# Patient Record
Sex: Male | Born: 1970 | Race: Black or African American | Hispanic: No | Marital: Married | Smoking: Never smoker
Health system: Southern US, Community
[De-identification: ages and names within clinical notes are randomized; demographics above are authoritative.]

## PROBLEM LIST (undated history)

## (undated) DIAGNOSIS — I10 Essential (primary) hypertension: Secondary | ICD-10-CM

## (undated) DIAGNOSIS — J449 Chronic obstructive pulmonary disease, unspecified: Secondary | ICD-10-CM

## (undated) DIAGNOSIS — I739 Peripheral vascular disease, unspecified: Secondary | ICD-10-CM

## (undated) DIAGNOSIS — E782 Mixed hyperlipidemia: Secondary | ICD-10-CM

## (undated) DIAGNOSIS — Z9114 Patient's other noncompliance with medication regimen: Secondary | ICD-10-CM

## (undated) DIAGNOSIS — I119 Hypertensive heart disease without heart failure: Secondary | ICD-10-CM

## (undated) DIAGNOSIS — I509 Heart failure, unspecified: Secondary | ICD-10-CM

## (undated) DIAGNOSIS — I2510 Atherosclerotic heart disease of native coronary artery without angina pectoris: Secondary | ICD-10-CM

## (undated) DIAGNOSIS — Z91148 Patient's other noncompliance with medication regimen for other reason: Secondary | ICD-10-CM

## (undated) DIAGNOSIS — Z8673 Personal history of transient ischemic attack (TIA), and cerebral infarction without residual deficits: Secondary | ICD-10-CM

## (undated) DIAGNOSIS — Z992 Dependence on renal dialysis: Secondary | ICD-10-CM

## (undated) DIAGNOSIS — N186 End stage renal disease: Secondary | ICD-10-CM

## (undated) HISTORY — DX: Patient's other noncompliance with medication regimen for other reason: Z91.148

## (undated) HISTORY — PX: CARDIAC CATHETERIZATION: SHX172

## (undated) HISTORY — DX: Peripheral vascular disease, unspecified: I73.9

## (undated) HISTORY — DX: Essential (primary) hypertension: I10

## (undated) HISTORY — DX: Dependence on renal dialysis: N18.6

## (undated) HISTORY — DX: Chronic obstructive pulmonary disease, unspecified: J44.9

## (undated) HISTORY — DX: Personal history of transient ischemic attack (TIA), and cerebral infarction without residual deficits: Z86.73

## (undated) HISTORY — DX: Heart failure, unspecified: I50.9

## (undated) HISTORY — DX: Mixed hyperlipidemia: E78.2

## (undated) HISTORY — DX: Patient's other noncompliance with medication regimen: Z91.14

## (undated) HISTORY — DX: Dependence on renal dialysis: Z99.2

## (undated) HISTORY — DX: Hypertensive heart disease without heart failure: I11.9

## (undated) HISTORY — DX: Atherosclerotic heart disease of native coronary artery without angina pectoris: I25.10

## (undated) HISTORY — PX: COLONOSCOPY: SHX174

---

## 2001-07-30 ENCOUNTER — Emergency Department (HOSPITAL_COMMUNITY): Admission: EM | Admit: 2001-07-30 | Discharge: 2001-07-30 | Payer: Self-pay | Admitting: Emergency Medicine

## 2003-02-02 ENCOUNTER — Emergency Department (HOSPITAL_COMMUNITY): Admission: EM | Admit: 2003-02-02 | Discharge: 2003-02-02 | Payer: Self-pay | Admitting: Emergency Medicine

## 2005-03-22 ENCOUNTER — Emergency Department (HOSPITAL_COMMUNITY): Admission: EM | Admit: 2005-03-22 | Discharge: 2005-03-22 | Payer: Self-pay | Admitting: Emergency Medicine

## 2005-04-19 ENCOUNTER — Emergency Department (HOSPITAL_COMMUNITY): Admission: EM | Admit: 2005-04-19 | Discharge: 2005-04-19 | Payer: Self-pay | Admitting: Emergency Medicine

## 2005-06-10 ENCOUNTER — Emergency Department (HOSPITAL_COMMUNITY): Admission: EM | Admit: 2005-06-10 | Discharge: 2005-06-10 | Payer: Self-pay | Admitting: Emergency Medicine

## 2005-10-01 ENCOUNTER — Emergency Department (HOSPITAL_COMMUNITY): Admission: EM | Admit: 2005-10-01 | Discharge: 2005-10-01 | Payer: Self-pay | Admitting: Emergency Medicine

## 2007-05-20 ENCOUNTER — Encounter: Payer: Self-pay | Admitting: Emergency Medicine

## 2007-05-20 ENCOUNTER — Ambulatory Visit: Payer: Self-pay | Admitting: *Deleted

## 2007-05-20 ENCOUNTER — Inpatient Hospital Stay (HOSPITAL_COMMUNITY): Admission: AD | Admit: 2007-05-20 | Discharge: 2007-05-24 | Payer: Self-pay | Admitting: Cardiovascular Disease

## 2007-05-23 ENCOUNTER — Encounter: Payer: Self-pay | Admitting: Cardiovascular Disease

## 2007-06-09 ENCOUNTER — Ambulatory Visit: Payer: Self-pay | Admitting: Cardiovascular Disease

## 2008-06-11 ENCOUNTER — Emergency Department (HOSPITAL_COMMUNITY): Admission: EM | Admit: 2008-06-11 | Discharge: 2008-06-11 | Payer: Self-pay | Admitting: Emergency Medicine

## 2008-06-23 ENCOUNTER — Emergency Department (HOSPITAL_COMMUNITY): Admission: EM | Admit: 2008-06-23 | Discharge: 2008-06-23 | Payer: Self-pay | Admitting: Emergency Medicine

## 2009-03-24 ENCOUNTER — Emergency Department (HOSPITAL_COMMUNITY): Admission: EM | Admit: 2009-03-24 | Discharge: 2009-03-24 | Payer: Self-pay | Admitting: Emergency Medicine

## 2010-01-24 ENCOUNTER — Inpatient Hospital Stay (HOSPITAL_COMMUNITY)
Admission: EM | Admit: 2010-01-24 | Discharge: 2010-01-29 | Disposition: A | Discharge status: Other Acute Inpatient Hospital | Payer: Self-pay | Source: Home / Self Care | Attending: Family Medicine | Admitting: Family Medicine

## 2010-01-25 ENCOUNTER — Encounter: Payer: Self-pay | Admitting: Cardiovascular Disease

## 2010-01-25 ENCOUNTER — Inpatient Hospital Stay (HOSPITAL_COMMUNITY)
Admission: EM | Admit: 2010-01-25 | Discharge: 2010-01-29 | Payer: Self-pay | Source: Home / Self Care | Attending: Internal Medicine | Admitting: Internal Medicine

## 2010-01-26 ENCOUNTER — Encounter (INDEPENDENT_AMBULATORY_CARE_PROVIDER_SITE_OTHER): Payer: Self-pay | Admitting: Cardiology

## 2010-01-26 DIAGNOSIS — Z8673 Personal history of transient ischemic attack (TIA), and cerebral infarction without residual deficits: Secondary | ICD-10-CM

## 2010-01-26 HISTORY — DX: Personal history of transient ischemic attack (TIA), and cerebral infarction without residual deficits: Z86.73

## 2010-01-29 LAB — CBC
HCT: 41.3 % (ref 39.0–52.0)
Hemoglobin: 13.4 g/dL (ref 13.0–17.0)
MCH: 28.1 pg (ref 26.0–34.0)
MCHC: 32.4 g/dL (ref 30.0–36.0)
MCV: 86.6 fL (ref 78.0–100.0)
Platelets: 394 10*3/uL (ref 150–400)
RBC: 4.77 MIL/uL (ref 4.22–5.81)
RDW: 13.4 % (ref 11.5–15.5)
WBC: 9.9 10*3/uL (ref 4.0–10.5)

## 2010-01-29 LAB — RENAL FUNCTION PANEL
Albumin: 3.3 g/dL — ABNORMAL LOW (ref 3.5–5.2)
BUN: 33 mg/dL — ABNORMAL HIGH (ref 6–23)
CO2: 24 mEq/L (ref 19–32)
Calcium: 9.9 mg/dL (ref 8.4–10.5)
Chloride: 105 mEq/L (ref 96–112)
Creatinine, Ser: 3.96 mg/dL — ABNORMAL HIGH (ref 0.4–1.5)
GFR calc Af Amer: 20 mL/min — ABNORMAL LOW (ref >60)
GFR calc non Af Amer: 17 mL/min — ABNORMAL LOW (ref >60)
Glucose, Bld: 102 mg/dL — ABNORMAL HIGH (ref 70–99)
Phosphorus: 4 mg/dL (ref 2.3–4.6)
Potassium: 4.2 mEq/L (ref 3.5–5.1)
Sodium: 140 mEq/L (ref 135–145)

## 2010-01-29 LAB — HEPARIN LEVEL (UNFRACTIONATED): Heparin Unfractionated: 0.74 IU/mL — ABNORMAL HIGH (ref 0.30–0.70)

## 2010-02-27 NOTE — Consult Note (Signed)
NAMEAkash, Winski Alexander                  ACCOUNT NO.:  [id number]  MEDICAL RECORD NO.:  [id number]          PATIENT TYPE:  INP  LOCATION:  3736                         FACILITY:  [facility]  PHYSICIAN:  Tanaka Gillen C. Eden Emms, MD, FACCDATE OF BIRTH:  March 02, 1970  DATE OF CONSULTATION:  01/25/2010 DATE OF DISCHARGE:                                CONSULTATION   PRIMARY CARDIOLOGIST:  Noralyn Pick. Eden Emms, MD, [facility], in [place] office.  PRIMARY CARE PROVIDER:  Provided at [facility].  PATIENT PROFILE:  This is a 40 year old African American male with prior history of hypertension and hypertensive urgency as well as chronic kidney disease who presents with acute hypertensive urgency and noted to have elevated cardiac markers.  PROBLEM LIST: 1. Hypertensive urgency.     a.     On May 23, 2007, 2-D echo, EF 60%, no regional wall motion      abnormalities.     b.     In April 2009, stress Myoview showing an EF of 37% with no      evidence of ischemia. 2. Acute on chronic stage IV kidney disease. 3. Hyperlipidemia. 4. Medication nonadherence.  ALLERGIES:  No known drug allergies.  HISTORY OF PRESENT ILLNESS:  This is a 40 year old male with history of hypertension that has been untreated for at least 4-6 months as the patient says he has not had money to buy his medicines.  He was previously admitted in April 2009 with positive troponin in the setting of hypertensive urgency.  During that admission, he had a normal echocardiogram and Myoview.  Over the last 2 weeks, the patient has had intermittent headaches as well as right arm pain that is worse with biceps flexion and rotation of his forearm.  Arm symptoms can last a day at a time.  He had worsening headache yesterday prompting him present to the [facility] where he was found to be markedly hypertensive with pressures of 200s/130s.  He was also found to have an elevated creatinine of 4.45 with CK-MB of 10.7 and troponin I of  0.27.  he was treated with IV and oral labetalol as well as amlodipine and transferred to [facility] for further evaluation.  Currently, the patient has no complaints.  His pressures are in the 150s/90s.  CURRENT MEDICATIONS: 1. Norvasc 10 mg daily. 2. Enoxaparin 40 mg q.p.m. 3. Labetalol 200 mg q.6 h. 4. K-Dur p.r.n.  FAMILY HISTORY:  Mother is alive at age 58 with hypertension and borderline diabetes mellitus.  Father died at 46 with history diabetes and lung cancer.  He has 3 sisters and 1 brother, all are alive and well.  SOCIAL HISTORY:  The patient lives in [place] with his wife.  He details cars for living.  He has a 50 year old stepson at home.  He denies tobacco, alcohol, or drug use.  He does not routinely exercise.  REVIEW OF SYSTEMS:  Positive for headaches and right arm pain as outlined in the HPI.  Otherwise, all systems are reviewed and are negative.  He is a full code.  PHYSICAL EXAMINATION:  VITAL SIGNS:  The patient  is afebrile, heart rate 77, respirations 16, blood pressure 156/96, and pulse ox 100% on 2liters. GENERAL:  A pleasant African American male, in no acute distress, awake, alert, and oriented x3.  He has a normal affect. HEENT:  Normal. NEURO:  Grossly intact and nonfocal. SKIN:  Warm and dry without lesions or masses. NECK:  Supple without bruits or JVD. LUNGS:  Respirations are regular and unlabored.  Clear to auscultation. CARDIAC:  Regular, S1 and S2, with an S4.  No murmurs. ABDOMEN:  Round, soft, nontender, and nondistended.  Bowel sounds present x4. EXTREMITIES:  Warm, dry, and pink.  No clubbing, cyanosis, or edema. Dorsalis pedis and posterior tibial pulses are 2+ and equal bilaterally.  Chest x-ray shows no acute findings, with possible cardiomegaly.  EKG shows sinus tachycardia, rate of 109, normal axis, LVH with inferolateral T-wave inversion, unchanged from prior ECG in 2009. Hemoglobin 12.7, hematocrit 35.5, WBC 10.1, and platelets  270.  Sodium 134, potassium 3.1, chloride 99, CO2 of 27, BUN 45, creatinine 3.99, and glucose 122.  CK 362, MB 8.4, and troponin-I 0.17.  Urine drug screen negative.  MRSA screen negative.  INR 1.09.  ASSESSMENT/PLAN: 1. Hypertensive urgency:  The patient presents with marked     hypertensive urgency in the setting of medication nonadherence over     the past 6+ months.  Pressure is currently stabilized in the 150/90     range on oral labetalol and amlodipine.  I agree with current     therapy.  We will repeat a 2-D echocardiogram given significant LVH     on EKG and evidence of end-organ damage.  I had a long discussion     with the patient and family discussing low-sodium diet and     importance of medication adherence.  The patient admits to eating     fast food in pretty much every meal.  He will need significant     education going forward. 2. Elevated cardiac markers:  The patient has elevated CK-MB and     troponin with the relative index of only 2.0.  Of note, he had a     troponin rise to 1.09 in April 2009 and at that time had normal EF     and a Myoview.  As it has been 2 years since his ischemic     evaluation, we will plan a Lexiscan Myoview once pressure is better     controlled to rule out ischemia.  We will avoid cath/contrast in     the setting of renal failure. 3. Acute on chronic stage IV kidney disease:  The patient's creatinine     is elevated this admission in the setting of     above.  Creatinine of May 2010 was 2.19.  Renal consult is pending. 4. History of hyperlipidemia:  Check lipids. 5. Noncompliant:  See above.     Nicolasa Ducking, ANP   ______________________________ Noralyn Pick. Eden Emms, MD, [facility]    CB/MEDQ  D:  01/25/2010  T:  01/26/2010  Job:  [id number]  Electronically Signed by Nicolasa Ducking ANP on 02/24/2010 12:22:13 PM Electronically Signed by Charlton Haws MD [facility] on 02/27/2010 10:36:59 PM

## 2010-03-13 NOTE — Letter (Signed)
Summary: [facility] Discharge Summary  [facility] Discharge Summary   Imported By: Earl Many 02/27/2010 18:36:47  _____________________________________________________________________  External Attachment:    Type:   Image     Comment:   External Document

## 2010-04-07 LAB — LIPID PANEL
Cholesterol: 171 mg/dL (ref 0–200)
HDL: 33 mg/dL — ABNORMAL LOW (ref >39)
HDL: 33 mg/dL — ABNORMAL LOW (ref >39)
LDL Cholesterol: 114 mg/dL — ABNORMAL HIGH (ref 0–99)
Total CHOL/HDL Ratio: 5.2 RATIO
Total CHOL/HDL Ratio: 5.2 RATIO
Triglycerides: 120 mg/dL (ref <150)

## 2010-04-07 LAB — HEMOGLOBIN A1C: Mean Plasma Glucose: 126 mg/dL — ABNORMAL HIGH (ref <117)

## 2010-04-07 LAB — RAPID URINE DRUG SCREEN, HOSP PERFORMED
Amphetamines: NOT DETECTED
Barbiturates: NOT DETECTED
Benzodiazepines: NOT DETECTED
Cocaine: NOT DETECTED
Opiates: NOT DETECTED
Tetrahydrocannabinol: NOT DETECTED

## 2010-04-07 LAB — BASIC METABOLIC PANEL
BUN: 45 mg/dL — ABNORMAL HIGH (ref 6–23)
BUN: 47 mg/dL — ABNORMAL HIGH (ref 6–23)
CO2: 26 mEq/L (ref 19–32)
CO2: 29 mEq/L (ref 19–32)
Calcium: 8.3 mg/dL — ABNORMAL LOW (ref 8.4–10.5)
Calcium: 8.6 mg/dL (ref 8.4–10.5)
Chloride: 103 mEq/L (ref 96–112)
Chloride: 98 mEq/L (ref 96–112)
Creatinine, Ser: 3.99 mg/dL — ABNORMAL HIGH (ref 0.4–1.5)
Creatinine, Ser: 4.45 mg/dL — ABNORMAL HIGH (ref 0.4–1.5)
GFR calc Af Amer: 18 mL/min — ABNORMAL LOW (ref >60)
GFR calc non Af Amer: 15 mL/min — ABNORMAL LOW (ref >60)
GFR calc non Af Amer: 16 mL/min — ABNORMAL LOW (ref >60)
GFR calc non Af Amer: 17 mL/min — ABNORMAL LOW (ref >60)
Glucose, Bld: 103 mg/dL — ABNORMAL HIGH (ref 70–99)
Glucose, Bld: 106 mg/dL — ABNORMAL HIGH (ref 70–99)
Glucose, Bld: 122 mg/dL — ABNORMAL HIGH (ref 70–99)
Potassium: 3.3 mEq/L — ABNORMAL LOW (ref 3.5–5.1)
Potassium: 3.4 mEq/L — ABNORMAL LOW (ref 3.5–5.1)
Sodium: 137 mEq/L (ref 135–145)
Sodium: 138 mEq/L (ref 135–145)

## 2010-04-07 LAB — IRON AND TIBC
Iron: 81 ug/dL (ref 42–135)
UIBC: 123 ug/dL

## 2010-04-07 LAB — DIFFERENTIAL
Basophils Absolute: 0 K/uL (ref 0.0–0.1)
Basophils Relative: 0 % (ref 0–1)
Eosinophils Absolute: 0.3 10*3/uL (ref 0.0–0.7)
Eosinophils Relative: 3 % (ref 0–5)
Lymphocytes Relative: 20 % (ref 12–46)
Lymphs Abs: 2.1 10*3/uL (ref 0.7–4.0)
Monocytes Absolute: 0.9 K/uL (ref 0.1–1.0)
Monocytes Relative: 9 % (ref 3–12)
Neutro Abs: 6.9 K/uL (ref 1.7–7.7)
Neutrophils Relative %: 68 % (ref 43–77)

## 2010-04-07 LAB — CBC
HCT: 33 % — ABNORMAL LOW (ref 39.0–52.0)
HCT: 35.5 % — ABNORMAL LOW (ref 39.0–52.0)
HCT: 36.7 % — ABNORMAL LOW (ref 39.0–52.0)
Hemoglobin: 10.7 g/dL — ABNORMAL LOW (ref 13.0–17.0)
Hemoglobin: 11.1 g/dL — ABNORMAL LOW (ref 13.0–17.0)
Hemoglobin: 11.7 g/dL — ABNORMAL LOW (ref 13.0–17.0)
Hemoglobin: 12 g/dL — ABNORMAL LOW (ref 13.0–17.0)
MCH: 27.5 pg (ref 26.0–34.0)
MCH: 27.8 pg (ref 26.0–34.0)
MCHC: 32.4 g/dL (ref 30.0–36.0)
MCHC: 33 g/dL (ref 30.0–36.0)
MCV: 83.5 fL (ref 78.0–100.0)
MCV: 85.7 fL (ref 78.0–100.0)
MCV: 86.4 fL (ref 78.0–100.0)
MCV: 86.5 fL (ref 78.0–100.0)
Platelets: 267 10*3/uL (ref 150–400)
Platelets: 270 10*3/uL (ref 150–400)
RBC: 4.07 MIL/uL — ABNORMAL LOW (ref 4.22–5.81)
RBC: 4.25 MIL/uL (ref 4.22–5.81)
RBC: 4.25 MIL/uL (ref 4.22–5.81)
RDW: 12.7 % (ref 11.5–15.5)
RDW: 13.3 % (ref 11.5–15.5)
WBC: 10.1 K/uL (ref 4.0–10.5)
WBC: 10.4 10*3/uL (ref 4.0–10.5)
WBC: 6 10*3/uL (ref 4.0–10.5)

## 2010-04-07 LAB — URINALYSIS, ROUTINE W REFLEX MICROSCOPIC
Bilirubin Urine: NEGATIVE
Glucose, UA: NEGATIVE mg/dL
Ketones, ur: NEGATIVE mg/dL
Leukocytes, UA: NEGATIVE
Nitrite: NEGATIVE
Protein, ur: 100 mg/dL — AB
Specific Gravity, Urine: 1.02 (ref 1.005–1.030)
Urobilinogen, UA: 0.2 mg/dL (ref 0.0–1.0)
pH: 6.5 (ref 5.0–8.0)

## 2010-04-07 LAB — POCT CARDIAC MARKERS
CKMB, poc: 10.6 ng/mL (ref 1.0–8.0)
Myoglobin, poc: >500 ng/mL (ref 12–200)
Troponin i, poc: <0.05 ng/mL (ref 0.00–0.09)

## 2010-04-07 LAB — COMPREHENSIVE METABOLIC PANEL
AST: 17 U/L (ref 0–37)
Albumin: 2.7 g/dL — ABNORMAL LOW (ref 3.5–5.2)
Alkaline Phosphatase: 70 U/L (ref 39–117)
BUN: 40 mg/dL — ABNORMAL HIGH (ref 6–23)
Chloride: 107 mEq/L (ref 96–112)
Potassium: 3.4 mEq/L — ABNORMAL LOW (ref 3.5–5.1)
Total Bilirubin: 0.3 mg/dL (ref 0.3–1.2)

## 2010-04-07 LAB — PROTIME-INR
INR: 1.04 (ref 0.00–1.49)
INR: 1.09 (ref 0.00–1.49)
Prothrombin Time: 13.8 seconds (ref 11.6–15.2)
Prothrombin Time: 14.3 seconds (ref 11.6–15.2)

## 2010-04-07 LAB — RENAL FUNCTION PANEL
Albumin: 2.4 g/dL — ABNORMAL LOW (ref 3.5–5.2)
BUN: 38 mg/dL — ABNORMAL HIGH (ref 6–23)
CO2: 24 mEq/L (ref 19–32)
CO2: 26 mEq/L (ref 19–32)
Calcium: 9.1 mg/dL (ref 8.4–10.5)
Chloride: 106 mEq/L (ref 96–112)
Creatinine, Ser: 4.16 mg/dL — ABNORMAL HIGH (ref 0.4–1.5)
GFR calc Af Amer: 21 mL/min — ABNORMAL LOW (ref >60)
GFR calc non Af Amer: 17 mL/min — ABNORMAL LOW (ref >60)
Glucose, Bld: 101 mg/dL — ABNORMAL HIGH (ref 70–99)
Potassium: 3.8 mEq/L (ref 3.5–5.1)
Potassium: 4.1 mEq/L (ref 3.5–5.1)
Sodium: 138 mEq/L (ref 135–145)

## 2010-04-07 LAB — METHYLMALONIC ACID, SERUM: Methylmalonic Acid, Quantitative: 247 nmol/L (ref 87–318)

## 2010-04-07 LAB — MRSA PCR SCREENING: MRSA by PCR: NEGATIVE

## 2010-04-07 LAB — CARDIAC PANEL(CRET KIN+CKTOT+MB+TROPI)
Total CK: 154 U/L (ref 7–232)
Troponin I: 0.17 ng/mL — ABNORMAL HIGH (ref 0.00–0.06)

## 2010-04-07 LAB — URINE MICROSCOPIC-ADD ON

## 2010-04-07 LAB — CK TOTAL AND CKMB (NOT AT ARMC)
CK, MB: 10.7 ng/mL (ref 0.3–4.0)
Relative Index: 2 (ref 0.0–2.5)
Total CK: 534 U/L — ABNORMAL HIGH (ref 7–232)

## 2010-04-07 LAB — HEPARIN LEVEL (UNFRACTIONATED)
Heparin Unfractionated: 0.1 IU/mL — ABNORMAL LOW (ref 0.30–0.70)
Heparin Unfractionated: 0.16 IU/mL — ABNORMAL LOW (ref 0.30–0.70)
Heparin Unfractionated: 0.51 IU/mL (ref 0.30–0.70)

## 2010-04-07 LAB — TROPONIN I: Troponin I: 0.27 ng/mL — ABNORMAL HIGH (ref 0.00–0.06)

## 2010-04-07 LAB — APTT: aPTT: 29 s (ref 24–37)

## 2010-05-06 LAB — POCT CARDIAC MARKERS: Myoglobin, poc: 137 ng/mL (ref 12–200)

## 2010-05-06 LAB — BASIC METABOLIC PANEL
BUN: 21 mg/dL (ref 6–23)
Creatinine, Ser: 2.19 mg/dL — ABNORMAL HIGH (ref 0.4–1.5)
GFR calc Af Amer: 41 mL/min — ABNORMAL LOW (ref >60)
GFR calc non Af Amer: 34 mL/min — ABNORMAL LOW (ref >60)
Potassium: 3.4 mEq/L — ABNORMAL LOW (ref 3.5–5.1)

## 2010-06-10 NOTE — Discharge Summary (Signed)
NAMEALCARIO, Mario Alexander                  ACCOUNT NO.:  [id number]   MEDICAL RECORD NO.:  [id number]          PATIENT TYPE:  INP   LOCATION:  4711                         FACILITY:  [facility]   PHYSICIAN:  Theodore Demark, PA-C   DATE OF BIRTH:  02/13/1970   DATE OF ADMISSION:  05/20/2007  DATE OF DISCHARGE:  05/24/2007                               DISCHARGE SUMMARY   PROCEDURES:  1. Adenosine Myoview.  2. 2-D echocardiogram.   PRIMARY AND FINAL DISCHARGE DIAGNOSIS:  Hypertensive urgency.   SECONDARY DIAGNOSES:  1. Chronic kidney disease stage 3 with a BUN of 24, creatinine of      2.21, and GFR 41 this admission.  2. Preserved left ventricular function with severe left ventricular      hypertrophy by echocardiogram this admission.   TIME OF DISCHARGE:  39 minutes.   HOSPITAL COURSE:  Mario Alexander is a 40 year old male with no previous  history of coronary artery disease.  Hypertensive and hyperlipidemia  medications for several months.  He went to [facility]  with a chief complaint of a headache.  His blood pressure was  significantly elevated, and it was felt that he needed to be admitted  for this, so he was transferred to [facility] for further  evaluation and admission.   His initial blood pressure was 260/115.  He received multiple IV  medications and was started on p.o. medicines as well.   Initially, he had hyponatremia with a sodium of 133, but this resolved.  His BUN and creatinine were elevated at 27/2.55 with a GFR of 34.  He  was hydrated, and this improved slightly, and at discharge his BUN was  24 with a creatinine of 2.21 and a GFR of 41.  His cardiac enzymes  became elevated with a peak CK-MB of 498/9.4 and then a repeat CK-MB 48  hours later of 197/9.0.  His troponin went to 1.09.  A lipid profile was  performed, which showed a total cholesterol of 109, triglycerides 76,  HDL 39, and LDL 136.  He was started on Zocor 40.  TSH was on the low  end  of normal at 0.530.  The urine drug screen was positive only for  opiates.   Because of his abnormal kidney function, it was felt that he should be  risk stratified with a Myoview.  Initially, we attempted to do a  treadmill Myoview; however, after less than 6 minutes on the treadmill,  his blood pressure was 220/125.  Dr. Myrtis Ser felt that to continue would  not be safe and he was changed to an adenosine Myoview.  The adenosine  Myoview results were negative for pharmacologic stress-induced ischemia.  His EF was 37%, but the radiologist felt that this was underestimated.  An echocardiogram showed severe LVH with an EF of 55%.   By May 24, 2007, his blood pressure had significantly improved and was  146/102.  He had no chest pain and no shortness of breath.  Mario Alexander  was evaluated by Dr. Excell Seltzer and considered stable for discharge with  outpatient followup and refill.   DISCHARGE INSTRUCTIONS:  His activity level is to be increased  gradually.  He is to follow up with the [facility] as  scheduled.  He is to follow up with Dr. Eden Emms at the [facility]  on Jun 09, 2007 at 11:15.  He is encouraged to eat a low-sodium and  heart-healthy diet.   DISCHARGE MEDICATIONS:  1. Aspirin 81 mg daily.  2. Norvasc 5 mg a day.  3. Coreg 12.5 mg b.i.d.  4. Simvastatin 40 mg a day.  5. Clonidine 0.1 mg b.i.d.      Theodore Demark, PA-C     RB/MEDQ  D:  05/24/2007  T:  05/25/2007  Job:  [id number]   cc:   [facility]

## 2010-06-10 NOTE — Consult Note (Signed)
NAME:  Mario Alexander, Mario Alexander                  ACCOUNT NO.:  [id number]   MEDICAL RECORD NO.:  [id number]          PATIENT TYPE:  EMS   LOCATION:  ED                            FACILITY:  [facility]   PHYSICIAN:  Osvaldo Shipper, MD     DATE OF BIRTH:  1970-02-16   DATE OF CONSULTATION:  05/20/2007  DATE OF DISCHARGE:                                 CONSULTATION   He [facility] to the free clinic.  He used to see Dr. Renard Matter in the past, but  this does not do so any more.   ADMITTING DIAGNOSES:  1. Hypertensive emergency with acute renal failure and myocardial-      demand ischemia.  2. Headaches as a result of #1 as well.   CHIEF COMPLAINT:  Headache for one week.   HISTORY OF PRESENT ILLNESS:  The patient is a 40 year old, African-  American male who has a 10-year history of hypertension who is not very  compliant with medications, who tells me that he ran out of his blood  pressure medications about a week ago and then started getting headache.  He ignored these symptoms for the last one week and decided to come in  today because of severe pain.  The headache is particularly in the back  of his head.  He denies any chest pain or shortness of breath, any  seizure activity, focal deficits, fever, chills.  His pain is about 7/10  in intensity.  He has not checked his blood pressure in the last one  week.  When he presented to the ED, his blood pressure was more than  200/115.  The patient was given IV medications with labetalol, and he  was also given clonidine p.o., and he was given sublingual  nitroglycerin.  Blood pressure subsided transiently but then has again  started elevating.  While I was questioning the patient, he had about  three episodes of emesis with clear fluid.   MEDICATIONS AT HOME:  He is supposed to be on the following:  1. Diovan HCT 160/12.5 a day.  2. Lipitor unknown dose.  3. Norvasc unknown dose.   ALLERGIES:  No known drug allergies.   PAST MEDICAL HISTORY:  Hypertension  for 10 years.  It is unclear if he  has been worked up for secondary causes of hypertension or not.  The  patient is not very clear on this aspect.   Denies any surgeries in the past.   SOCIAL HISTORY:  Lives in [place] with his wife.  He does clerical  work.  Denies smoking, alcohol, or illicit drug use.   FAMILY HISTORY:  Noncontributory.   REVIEW OF SYSTEMS:  The patient is a somewhat lethargic and somnolent,  easily arousable though but review of system is unable to be done.   PHYSICAL EXAMINATION:  VITAL SIGNS:  Temperature was 97.9.  Blood  pressure medication when he came in was 260/115; last reading was  177/118.  His pulse when he came in was 101, and he is running about 60-  70.  Saturation 98% on room air.  GENERAL  EXAM:  Is well-developed, well-nourished individual, slightly  somnolent but easily arousable, in no distress.  HEENT:  There is no pallor, no icterus.  Oral mucous membranes moist.  Pupils are very small, probably because of the narcotic he has been  given.  NECK:  Soft and supple.  No thyromegaly appreciated.  LUNGS:  Clear to auscultation bilaterally.  No wheezing, rales, or  rhonchi.  CARDIOVASCULAR:  S1/S2 normal, regular.  No murmurs appreciated.  ABDOMEN:  Soft, nontender, nondistended.  Bowel sounds are present.  No  mass or organomegaly appreciated.  EXTREMITIES:  Show no edema.  Peripheral pulses are palpable.  NEUROLOGICAL:  The patient is somnolent, arousable, oriented x3.  No  focal neurological deficits are present.   LABORATORY DATA:  Sodium was 133, potassium 3.4, chloride is 96, bicarb  is 28, glucose 133, BUN is 27, creatinine is 2.55.  Calcium 9.4.  CK  total is 498, MB is 9.4, RI is 1.9, troponin 0.08.  No previous labs  available in our system here.  He had two EKGs done, one when he  presented - at that time he was found to be in sinus rhythm with a rate  of 98, normal axis, intervals appear to be the normal range, diffuse ST   depression with T-wave inversion was noted.  Evidence for left  ventricular hypertrophy is present; early repolarization changes are  present.  Repeat EKG shows similar changes except that the rate has  slowed to 67.  He had a CT head because of his headaches which was  negative for any acute intracranial process.  Mucous retention cyst was  noted in the left maxillary sinus.  He has not had a chest x-ray.   ASSESSMENT:  This is a 40 year old African-American male who presents  with headache and is found to have evidence for hypertensive emergency.  He has evidence for myocardial ischemia because of demand.  He has  evidence for possibly acute renal failure which is probably also the  result of the hypertension.  So, he has two-system involvement with  hypertensive emergency.  Because we do not have any cardiology coverage  starting now and through the weekend, I hesitate in admitting this  patient to our hospital, as we are not capable of taking care of any  cardiac complications if that were to happen.  So, I discussed the case  with Dr. Excell Seltzer with [facility] who has accepted the patient in  transfer.  The patient has been started on nitroglycerin drip.  His  nausea and vomiting are likely because of his high blood pressure and  some of the medications he has been given.  If this does not subside, he  will need further imaging studies.   We will also need to undergo a chest x-ray as this has not been done  yet.   Ultrasound of his kidneys will also be desirable to check the kidney  size and to see if there is any role to evaluate for renal artery  stenosis.  Other workup for secondary hypertension possibly can be done  as an outpatient, if that has not already been pursued.      Osvaldo Shipper, MD  Electronically Signed     GK/MEDQ  D:  05/20/2007  T:  05/20/2007  Job:  [id number]   cc:   Veverly Fells. Excell Seltzer, MD  [address]  [place], [place] [address]

## 2010-06-10 NOTE — Assessment & Plan Note (Signed)
[facility] [place] [facility]                       [place] CARDIOLOGY OFFICE NOTE   JIA, MOHAMED                         MRN:          [id number]  DATE:06/09/2007                            DOB:          08-Apr-1970    Mr. Rybacki is seen today in followup.  I was not involved with his care.  He was in the hospital at the end of April.  He presented with headache  and hypertensive urgency. He is a longstanding hypertensive patient who  ran out of medications.  While in the hospital, he was cared for by Dr.  Myrtis Ser.  He had severe LVH on his echo with an EF of 55%.  There was a  question of nonobstructive HOCM, but I suspect this was from untreated  hypertension.  The patient also had a Myoview which was nonischemic.  Since hospital discharge, he has felt better.  He has been compliant  with his meds.  He is using both the free clinic and [facility] to get his  medications.  His headache is gone.  He is not having significant chest  pain, palpitations, PND, or orthopnea.  There has been no dizziness.   REVIEW OF SYSTEMS:  His review of systems is otherwise negative.  He  wants to go back to work.  I believe he used to be at the [employer].   ALLERGIES:  NO KNOWN DRUG ALLERGIES.   MEDICATIONS:  1. Aspirin a day.  2. Norvasc 5 mg to be increased to 10 mg.  3. Coreg 12.5 b.i.d.  4. Simvastatin 40 a day.  5. Clonidine 0.1 b.i.d.   PHYSICAL EXAMINATION:  GENERAL:  A young black male in no distress.  VITAL SIGNS:  Blood pressure is 140/80, weight is 183, respiratory rate  14, afebrile, pulse 70.  HEENT:  Unremarkable.  NECK:  Carotids normal without bruit.  No lymphadenopathy, thyromegaly,  or JVP elevation.  LUNGS:  Clear with diaphragmatic motion.  No wheezing.  CARDIOVASCULAR:  S1-S2 with an S4 gallop.  PMI normal.  ABDOMEN:  Benign.  No abdominal bruit, no AAA, no tenderness.  No  hepatosplenomegaly, no hepatojugular reflux.  EXTREMITIES:  Distal pulses intact.  No  edema.  NEUROLOGIC:  Nonfocal.  SKIN:  Warm and dry.  MUSCULOSKELETAL:  No muscular weakness.   IMPRESSION:  1. Hypertension, better controlled.  Continue current therapy.      Increase Norvasc to 10 mg a day.  Prescription will be done through      Dr. Clinton Gallant.  2. Question hypertrophic nonobstructive cardiomyopathy, unlikely.  He      had no murmur on exam.  He has not had symptoms referable to an      outflow tract murmur.  I suspect this is just severe left      ventricular hypertrophy from untreated hypertension.  3. Mild chronic renal failure.  As I can see in the hospital, his      creatinine runs 2-2.4.  He should      have renal followup.  I will leave this up to the free clinic.  4. Hypercholesterolemia.  Continue  simvastatin 40 a day.  Lipid and      liver profile in 6 months.  5. The patient was given a note to return to work.     Noralyn Pick. Eden Emms, MD, [facility]  Electronically Signed    PCN/MedQ  DD: 06/09/2007  DT: 06/09/2007  Job #: [id number]

## 2010-06-10 NOTE — H&P (Signed)
Mario Alexander, Mario Alexander                  ACCOUNT NO.:  [id number]   MEDICAL RECORD NO.:  [id number]          PATIENT TYPE:  INP   LOCATION:  2915                         FACILITY:  [facility]   PHYSICIAN:  Unice Cobble, MD     DATE OF BIRTH:  23-Mar-1970   DATE OF ADMISSION:  05/20/2007  DATE OF DISCHARGE:                              HISTORY & PHYSICAL   PRIMARY MEDICAL DOCTOR:  [facility] [place].   CHIEF COMPLAINT:  Headache.   HISTORY OF PRESENT ILLNESS:  This is a 40 year old African American male  with a history of hypertension and hyperlipidemia who presents with 5 to  6 days of headache.  The patient ran out of his medications, which he  gets from the [facility] in [place].  He has presented to [facility]  with this exact same complaint before.  He denies chest pain, shortness  of breath, dyspnea on exertion, lower extremity edema, paroxysmal  nocturnal dyspnea, orthopnea or palpitations.  Blood pressure on  admission to [facility] was 260/115.  He was brought down with 60 mg IV  labetalol given in 3 successive 20-mg IV pushes, 0.2 mg of clonidine,  and an additional 40 mg more of IV labetalol with the start of a  nitroglycerin drip.  His headache resolved after that and he now feels  fine.   PAST MEDICAL HISTORY:  1. Hypertension.  2. Hyperlipidemia.   ALLERGIES:  No known drug allergies.   MEDICATIONS:  1. Diovan 160 mg/12.5 mg.  2. Lipitor 10 mg.  3. Norvasc 10 mg.   SOCIAL HISTORY:  He lives in [place] with his wife and son.  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  No tobacco, alcohol or drugs.  He  has never used tobacco.   FAMILY HISTORY:  There is no history of MI or stroke in the family.  His  father has diabetes.  His sister is healthy.   REVIEW OF SYSTEMS:  Complete review of systems was done and found to be  negative except as mentioned in the HPI.   PHYSICAL EXAMINATION:  VITAL SIGNS:  Temperature is 97.9 with a pulse of  61, respiratory  rate is 13, blood pressure is 158/113, and O2 sats are  92% on 2 liters.  GENERAL:  He is thin and in no acute distress.  HEENT:  Shows PERRLA, EOMI, MMM, normal dentition, and oropharynx  without erythema or exudates.  NECK:  Supple without lymphadenopathy, thyromegaly, bruits or jugular  venous distention.  HEART:  Heart has a regular rate and rhythm with a normal S1 and S2  without murmurs, gallops or rubs.  PMI is nondisplaced, and pulses are  2+ and equal bilaterally without bruits.  LUNGS:  Clear to auscultation bilaterally.  SKIN:  No rashes or lesions.  ABDOMEN:  Soft and nontender with normal bowel sounds and no rebound or  guarding.  No hepatosplenomegaly.  EXTREMITIES:  Show no cyanosis, clubbing or edema.  NEUROLOGIC:  He is  alert and oriented x3 with cranial nerves II-XII grossly intact.  Strength is  5/5, all extremities and axial groups, with normal sensation  throughout.   EKG shows normal sinus rhythm at a rate of 98.  He has left ventricular  hypertrophy with repolarization abnormalities.  Labs show a creatinine  of 2.6 with a total CK of 498, CK-MB of 9.4 and a troponin of 0.08.   ASSESSMENT/PLAN:  This is a 40 year old [place] American male with a  history of hypertension and hyperlipidemia who presents with  hypertensive urgency.  His ECG shows left ventricular hypertrophy with  repolarization abnormalities, and his troponin is mildly elevated.  His  creatinine is also elevated, and I suspect dehydration and some  hypertensive damage.  His troponin elevation is likely due to cardiac  strain.  His blood pressure is under control now, and he needs to be on  4-dollar medications upon discharge.  I will start metoprolol tonight  with hydralazine IV push as a backup should his blood pressure be too  high.  Clonidine, although inexpensive, would be disastrous in this  patient long-term.  If his renal failure improves, a  lisinopril/hydrochlorothiazide combination pill  would be an excellent  choice.  He will need a renal artery scan and possibly an echocardiogram  as an outpatient to evaluate his left ventricular function.  If these  are negative, then workup for pheochromocytoma may be warranted.      Unice Cobble, MD  Electronically Signed     ACJ/MEDQ  D:  05/20/2007  T:  05/20/2007  Job:  [id number]

## 2010-08-20 ENCOUNTER — Emergency Department (HOSPITAL_COMMUNITY)
Admission: EM | Admit: 2010-08-20 | Discharge: 2010-08-20 | Disposition: A | Discharge status: Home / Self Care | Payer: Worker's Compensation | Attending: Emergency Medicine | Admitting: Emergency Medicine

## 2010-08-20 ENCOUNTER — Encounter: Payer: Self-pay | Admitting: Emergency Medicine

## 2010-08-20 DIAGNOSIS — S00211A Abrasion of right eyelid and periocular area, initial encounter: Secondary | ICD-10-CM

## 2010-08-20 DIAGNOSIS — E785 Hyperlipidemia, unspecified: Secondary | ICD-10-CM | POA: Insufficient documentation

## 2010-08-20 DIAGNOSIS — I10 Essential (primary) hypertension: Secondary | ICD-10-CM | POA: Insufficient documentation

## 2010-08-20 DIAGNOSIS — S0031XA Abrasion of nose, initial encounter: Secondary | ICD-10-CM

## 2010-08-20 DIAGNOSIS — S0501XA Injury of conjunctiva and corneal abrasion without foreign body, right eye, initial encounter: Secondary | ICD-10-CM

## 2010-08-20 DIAGNOSIS — S058X9A Other injuries of unspecified eye and orbit, initial encounter: Secondary | ICD-10-CM | POA: Insufficient documentation

## 2010-08-20 DIAGNOSIS — IMO0002 Reserved for concepts with insufficient information to code with codable children: Secondary | ICD-10-CM | POA: Insufficient documentation

## 2010-08-20 DIAGNOSIS — S00209A Unspecified superficial injury of unspecified eyelid and periocular area, initial encounter: Secondary | ICD-10-CM | POA: Insufficient documentation

## 2010-08-20 DIAGNOSIS — Y9269 Other specified industrial and construction area as the place of occurrence of the external cause: Secondary | ICD-10-CM | POA: Insufficient documentation

## 2010-08-20 MED ORDER — HYDROCODONE-ACETAMINOPHEN 5-325 MG PO TABS
1.0000 | ORAL_TABLET | Freq: Once | ORAL | Status: AC
  Administered 2010-08-20: 1 via ORAL
  Filled 2010-08-20: qty 1

## 2010-08-20 MED ORDER — TOBRAMYCIN 0.3 % OP SOLN
2.0000 [drp] | Freq: Once | OPHTHALMIC | Status: AC
  Administered 2010-08-20: 2 [drp] via OPHTHALMIC
  Filled 2010-08-20: qty 5

## 2010-08-20 MED ORDER — TETRACAINE HCL 0.5 % OP SOLN
2.0000 [drp] | Freq: Once | OPHTHALMIC | Status: AC
  Administered 2010-08-20: 2 [drp] via OPHTHALMIC
  Filled 2010-08-20: qty 2

## 2010-08-20 MED ORDER — KETOROLAC TROMETHAMINE 0.5 % OP SOLN
1.0000 [drp] | Freq: Once | OPHTHALMIC | Status: AC
  Administered 2010-08-20: 1 [drp] via OPHTHALMIC
  Filled 2010-08-20: qty 5

## 2010-08-20 MED ORDER — HYDROCODONE-ACETAMINOPHEN 5-325 MG PO TABS: ORAL_TABLET | ORAL | Status: AC

## 2010-08-20 NOTE — ED Notes (Signed)
Pt at work and something flew up into r eye. Swelling and redness noted. nad denies blurred vision or dizziness

## 2010-08-20 NOTE — ED Notes (Signed)
Patient with no complaints at this time. Respirations even and unlabored. Skin warm/dry. Discharge instructions reviewed with patient at this time. Patient given opportunity to voice concerns/ask questions. Patient discharged at this time and left Emergency Department with steady gait.   

## 2010-08-20 NOTE — ED Notes (Signed)
Has not taken bp meds yet today.

## 2010-08-20 NOTE — ED Notes (Signed)
Patient reporting 7/10 pain in right eye and is wondering when he is being discharged. Pauline Aus, PA made aware.

## 2010-08-20 NOTE — ED Provider Notes (Signed)
History     Chief Complaint  Patient presents with  . Eye Pain   HPI Comments: Patient c/o pain and swelling of the right eyelid.  States that he was at working this morning and a sprayer off the end of a air hose blew off and struck the corner of his right eye and nose. He denies visual changes, headache, LOC, dizziness, vomiting or epistaxis.  States this is a work related injury.    Patient is a 40 y.o. male presenting with eye pain and eye injury.  Eye Pain Pertinent negatives include no abdominal pain, arthralgias, chest pain, fever, headaches, myalgias, nausea, neck pain, numbness, vomiting or weakness.  Eye Injury This is a new problem. The current episode started today. The problem occurs constantly. The problem has been gradually improving. Pertinent negatives include no abdominal pain, arthralgias, chest pain, fever, headaches, myalgias, nausea, neck pain, numbness, vomiting or weakness. Exacerbated by: blinking. He has tried nothing for the symptoms. The treatment provided no relief.    Past Medical History  Diagnosis Date  . Hypertension   . Hypercholesteremia     History reviewed. No pertinent past surgical history.  History reviewed. No pertinent family history.  History  Substance Use Topics  . Smoking status: Never Smoker   . Smokeless tobacco: Not on file  . Alcohol Use: No      Review of Systems  Constitutional: Negative for fever, activity change and appetite change.  HENT: Negative for hearing loss, ear pain, nosebleeds, rhinorrhea, neck pain, neck stiffness and dental problem.   Eyes: Positive for pain and redness. Negative for discharge, itching and visual disturbance.  Respiratory: Negative.   Cardiovascular: Negative for chest pain.  Gastrointestinal: Negative for nausea, vomiting and abdominal pain.  Musculoskeletal: Negative for myalgias and arthralgias.  Skin: Positive for wound.  Neurological: Negative for dizziness, speech difficulty, weakness,  numbness and headaches.  Hematological: Does not bruise/bleed easily.    Physical Exam  BP 201/135  Pulse 73  Temp(Src) 98.6 F (37 C) (Oral)  Resp 18  SpO2 99%  Physical Exam  Nursing note and vitals reviewed. Constitutional: He is oriented to person, place, and time. He appears well-developed and well-nourished. No distress.  HENT:  Head: Head is with abrasion.    Right Ear: External ear normal.  Left Ear: External ear normal.  Eyes: EOM are normal. Pupils are equal, round, and reactive to light. No foreign bodies found. Right eye exhibits no chemosis, no discharge and no exudate. No foreign body present in the right eye. Left eye exhibits no discharge. Right conjunctiva is injected. Right conjunctiva has no hemorrhage. Right eye exhibits normal extraocular motion and no nystagmus. Left eye exhibits normal extraocular motion and no nystagmus.  Fundoscopic exam:      The right eye shows no exudate and no papilledema.  Slit lamp exam:      The right eye shows corneal abrasion and fluorescein uptake. The right eye shows no corneal flare, no corneal ulcer, no foreign body and no hyphema.    Cardiovascular: Normal rate, regular rhythm and normal heart sounds.   Pulmonary/Chest: Effort normal and breath sounds normal.  Musculoskeletal: Normal range of motion.  Neurological: He is alert and oriented to person, place, and time. He exhibits normal muscle tone. Coordination normal.  Skin: Skin is warm and dry.       Abrasion to right upper eyelid and right nose.    Psychiatric: He has a normal mood and affect.  ED Course  Procedures  MDM   1110  Patient feeling better, remains hypertensive, but has not taken his BP meds this morning. Denies sx's except eyelid pain.  Vision intact, globe appears intact.  No FB's seen.  EOM's intact.  HAs a corneal abrasion to medial right eye.  See nursing chart for visual acuity. 20/30 in each eye.  Pt agreees to f/u with ophth      Eldine Rencher  L. Devanshi Califf, [place] 08/20/10 1133

## 2010-08-21 NOTE — ED Provider Notes (Signed)
Medical screening examination/treatment/procedure(s) were performed by non-physician practitioner and as supervising physician I was immediately available for consultation/collaboration.   Lyanne Co, MD 08/21/10 [phone]

## 2010-10-21 LAB — CBC
HCT: 40.4
Hemoglobin: 13.7
MCHC: 33.9
MCV: 87.3
RBC: 4.63
WBC: 11.3 — ABNORMAL HIGH

## 2010-10-21 LAB — COMPREHENSIVE METABOLIC PANEL
BUN: 29 — ABNORMAL HIGH
CO2: 29
Calcium: 8.5
Creatinine, Ser: 2.63 — ABNORMAL HIGH
GFR calc non Af Amer: 27 — ABNORMAL LOW
Glucose, Bld: 104 — ABNORMAL HIGH
Total Protein: 6.1

## 2010-10-21 LAB — BASIC METABOLIC PANEL
BUN: 24 — ABNORMAL HIGH
BUN: 27 — ABNORMAL HIGH
CO2: 25
CO2: 26
CO2: 28
Calcium: 8.8
Calcium: 9.4
Chloride: 96
Creatinine, Ser: 2.55 — ABNORMAL HIGH
GFR calc Af Amer: 34 — ABNORMAL LOW
GFR calc Af Amer: 37 — ABNORMAL LOW
GFR calc non Af Amer: 28 — ABNORMAL LOW
GFR calc non Af Amer: 30 — ABNORMAL LOW
Glucose, Bld: 116 — ABNORMAL HIGH
Glucose, Bld: 123 — ABNORMAL HIGH
Potassium: 3.8
Potassium: 4
Sodium: 133 — ABNORMAL LOW
Sodium: 137
Sodium: 138

## 2010-10-21 LAB — LIPID PANEL
Cholesterol: 190
HDL: 39 — ABNORMAL LOW
LDL Cholesterol: 136 — ABNORMAL HIGH
Total CHOL/HDL Ratio: 4.9
Triglycerides: 76

## 2010-10-21 LAB — DIFFERENTIAL
Basophils Absolute: 0
Basophils Relative: 0
Eosinophils Absolute: 0
Eosinophils Relative: 0
Lymphocytes Relative: 6 — ABNORMAL LOW

## 2010-10-21 LAB — HEMOGLOBIN A1C: Hgb A1c MFr Bld: 5.7

## 2010-10-21 LAB — CARDIAC PANEL(CRET KIN+CKTOT+MB+TROPI)
CK, MB: 7.5 — ABNORMAL HIGH
CK, MB: 9 — ABNORMAL HIGH
Relative Index: 4.6 — ABNORMAL HIGH
Total CK: 197

## 2010-10-21 LAB — CK TOTAL AND CKMB (NOT AT ARMC)
CK, MB: 4.6 — ABNORMAL HIGH
CK, MB: 7.3 — ABNORMAL HIGH
Relative Index: 2.2
Total CK: 202
Total CK: 325 — ABNORMAL HIGH

## 2010-10-21 LAB — PROTIME-INR: INR: 1.1

## 2010-10-21 LAB — TROPONIN I: Troponin I: 0.06

## 2010-10-21 LAB — RAPID URINE DRUG SCREEN, HOSP PERFORMED
Barbiturates: NOT DETECTED
Benzodiazepines: NOT DETECTED

## 2010-10-21 LAB — TSH: TSH: 0.53

## 2010-10-28 ENCOUNTER — Emergency Department (HOSPITAL_COMMUNITY)
Admission: EM | Admit: 2010-10-28 | Discharge: 2010-10-29 | Disposition: A | Discharge status: Home / Self Care | Payer: BC Managed Care – PPO | Attending: Emergency Medicine | Admitting: Emergency Medicine

## 2010-10-28 ENCOUNTER — Encounter (HOSPITAL_COMMUNITY): Payer: Self-pay | Admitting: *Deleted

## 2010-10-28 DIAGNOSIS — R109 Unspecified abdominal pain: Secondary | ICD-10-CM | POA: Insufficient documentation

## 2010-10-28 DIAGNOSIS — R197 Diarrhea, unspecified: Secondary | ICD-10-CM | POA: Insufficient documentation

## 2010-10-28 DIAGNOSIS — R112 Nausea with vomiting, unspecified: Secondary | ICD-10-CM | POA: Insufficient documentation

## 2010-10-28 DIAGNOSIS — R51 Headache: Secondary | ICD-10-CM | POA: Insufficient documentation

## 2010-10-28 DIAGNOSIS — K529 Noninfective gastroenteritis and colitis, unspecified: Secondary | ICD-10-CM

## 2010-10-28 DIAGNOSIS — K5289 Other specified noninfective gastroenteritis and colitis: Secondary | ICD-10-CM | POA: Insufficient documentation

## 2010-10-28 DIAGNOSIS — Z79899 Other long term (current) drug therapy: Secondary | ICD-10-CM | POA: Insufficient documentation

## 2010-10-28 LAB — COMPREHENSIVE METABOLIC PANEL
Albumin: 3.1 g/dL — ABNORMAL LOW (ref 3.5–5.2)
BUN: 48 mg/dL — ABNORMAL HIGH (ref 6–23)
Calcium: 9.3 mg/dL (ref 8.4–10.5)
Chloride: 93 mEq/L — ABNORMAL LOW (ref 96–112)
Creatinine, Ser: 4.67 mg/dL — ABNORMAL HIGH (ref 0.50–1.35)
Total Bilirubin: 0.2 mg/dL — ABNORMAL LOW (ref 0.3–1.2)
Total Protein: 7.3 g/dL (ref 6.0–8.3)

## 2010-10-28 LAB — CBC
HCT: 53.1 % — ABNORMAL HIGH (ref 39.0–52.0)
Hemoglobin: 18 g/dL — ABNORMAL HIGH (ref 13.0–17.0)
MCH: 28.9 pg (ref 26.0–34.0)
MCHC: 33.9 g/dL (ref 30.0–36.0)
MCV: 85.4 fL (ref 78.0–100.0)
RDW: 13.2 % (ref 11.5–15.5)

## 2010-10-28 LAB — DIFFERENTIAL
Basophils Absolute: 0 10*3/uL (ref 0.0–0.1)
Basophils Relative: 0 % (ref 0–1)
Eosinophils Absolute: 0.1 10*3/uL (ref 0.0–0.7)
Eosinophils Relative: 1 % (ref 0–5)
Monocytes Absolute: 0.6 10*3/uL (ref 0.1–1.0)
Monocytes Relative: 4 % (ref 3–12)

## 2010-10-28 MED ORDER — ONDANSETRON HCL 4 MG/2ML IJ SOLN
4.0000 mg | Freq: Once | INTRAMUSCULAR | Status: AC
  Administered 2010-10-28: 4 mg via INTRAVENOUS
  Filled 2010-10-28: qty 2

## 2010-10-28 MED ORDER — PROMETHAZINE HCL 25 MG PO TABS: 25.0000 mg | ORAL_TABLET | Freq: Four times a day (QID) | ORAL | Status: AC | PRN

## 2010-10-28 MED ORDER — HYDRALAZINE HCL 20 MG/ML IJ SOLN
20.0000 mg | Freq: Once | INTRAMUSCULAR | Status: AC
  Administered 2010-10-28: 20 mg via INTRAVENOUS
  Filled 2010-10-28: qty 1

## 2010-10-28 MED ORDER — SODIUM CHLORIDE 0.9 % IV BOLUS (SEPSIS)
1000.0000 mL | Freq: Once | INTRAVENOUS | Status: AC
  Administered 2010-10-28: 1000 mL via INTRAVENOUS

## 2010-10-28 MED ORDER — HYDROMORPHONE HCL 1 MG/ML IJ SOLN
1.0000 mg | Freq: Once | INTRAMUSCULAR | Status: AC
  Administered 2010-10-28: 1 mg via INTRAVENOUS
  Filled 2010-10-28: qty 1

## 2010-10-28 MED ORDER — CLONIDINE HCL 0.1 MG PO TABS
ORAL_TABLET | ORAL | Status: AC
  Administered 2010-10-28: 0.1 mg
  Filled 2010-10-28: qty 1

## 2010-10-28 MED ORDER — HYDROCODONE-ACETAMINOPHEN 5-325 MG PO TABS: 1.0000 | ORAL_TABLET | Freq: Four times a day (QID) | ORAL | Status: AC | PRN

## 2010-10-28 NOTE — ED Notes (Signed)
Patient presented with headache and nausea. He was noted to have 2 loose stools prior to arrival. During his ER course he received antihypertensives and pain medications which made him feel slightly better. Blood pressure remains high while in the emergency department.  Physical exam lungs clear, soft systolic murmur, abdomen soft, oropharynx clear moist, lower strumming is without edema. Mental status normal.  Patient is no acute signs of focal neurologic deficits, has severe hypertension at 215/145 at this time. Patient does not want to be admitted but does want to have his blood pressure reduced which is reasonable. Medications ordered will reevaluate the   1:54 AM Pt improved significantly after meds, BP 160/100 on d/c - agreeable to take meds in the AM as prescribed and verbally agrees to f/u closely for recheck.  Vida Roller, MD 10/29/10 [phone]

## 2010-10-28 NOTE — ED Provider Notes (Signed)
History     CSN: [id number] Arrival date & time: 10/28/2010  7:19 PM  Chief Complaint  Patient presents with  . Emesis  . Headache    (Consider location/radiation/quality/duration/timing/severity/associated sxs/prior treatment) Patient is a 40 y.o. male presenting with vomiting and headaches. The history is provided by the patient (Patient states that he has had dull pain vomiting and diarrhea for 24 hours he states that there are a number of people at work have similar symptoms he also complains of headache).  Emesis  This is a new problem. The current episode started yesterday. The problem occurs 2 to 4 times per day. The problem has not changed since onset.Associated symptoms include abdominal pain, diarrhea and headaches. Pertinent negatives include no cough.  Headache  Associated symptoms include vomiting.    Past Medical History  Diagnosis Date  . Hypertension   . Hypercholesteremia     History reviewed. No pertinent past surgical history.  History reviewed. No pertinent family history.  History  Substance Use Topics  . Smoking status: Never Smoker   . Smokeless tobacco: Not on file  . Alcohol Use: No      Review of Systems  Constitutional: Negative for fatigue.  HENT: Negative for congestion, sinus pressure and ear discharge.   Eyes: Negative for discharge.  Respiratory: Negative for cough.   Cardiovascular: Negative for chest pain.  Gastrointestinal: Positive for vomiting, abdominal pain and diarrhea.  Genitourinary: Negative for frequency and hematuria.  Musculoskeletal: Negative for back pain.  Skin: Negative for rash.  Neurological: Positive for headaches. Negative for seizures.  Hematological: Negative.   Psychiatric/Behavioral: Negative for hallucinations.    Allergies  Review of patient's allergies indicates no known allergies.  Home Medications   Current Outpatient Rx  Name Route Sig Dispense Refill  . AMLODIPINE BESYLATE 5 MG PO TABS Oral  Take 5 mg by mouth daily.      Marland Kitchen CARVEDILOL 3.125 MG PO TABS Oral Take 3.125 mg by mouth 2 (two) times daily with a meal.      . CLONIDINE HCL 0.1 MG PO TABS Oral Take 0.1 mg by mouth 2 (two) times daily.      Marland Kitchen CLOPIDOGREL BISULFATE 75 MG PO TABS Oral Take 75 mg by mouth daily.      . IBUPROFEN 200 MG PO TABS Oral Take 200 mg by mouth as needed. For headache pain     . SIMVASTATIN 20 MG PO TABS Oral Take 20 mg by mouth at bedtime.      Marland Kitchen HYDROCODONE-ACETAMINOPHEN 5-325 MG PO TABS Oral Take 1 tablet by mouth every 6 (six) hours as needed for pain. 20 tablet 0  . PROMETHAZINE HCL 25 MG PO TABS Oral Take 1 tablet (25 mg total) by mouth every 6 (six) hours as needed for nausea. 15 tablet 0    BP 188/133  Pulse 117  Temp(Src) 99 F (37.2 C) (Oral)  Resp 18  Ht 5\' 6"  (1.676 m)  Wt 180 lb (81.647 kg)  BMI 29.05 kg/m2  SpO2 100%  Physical Exam  Constitutional: He is oriented to person, place, and time. He appears well-developed.  HENT:  Head: Normocephalic and atraumatic.  Eyes: Conjunctivae and EOM are normal. No scleral icterus.  Neck: Neck supple. No thyromegaly present.  Cardiovascular: Normal rate and regular rhythm.  Exam reveals no gallop and no friction rub.   No murmur heard. Pulmonary/Chest: No stridor. He has no wheezes. He has no rales. He exhibits no tenderness.  Abdominal: He exhibits no  distension. There is tenderness. There is no rebound.       Minor tendernous  periumbilical  Musculoskeletal: Normal range of motion. He exhibits no edema.  Lymphadenopathy:    He has no cervical adenopathy.  Neurological: He is oriented to person, place, and time. Coordination normal.  Skin: No rash noted. No erythema.  Psychiatric: He has a normal mood and affect. His behavior is normal.    ED Course  Procedures (including critical care time)  Labs Reviewed  CBC - Abnormal; Notable for the following:    WBC 14.4 (*)    RBC 6.22 (*)    Hemoglobin 18.0 (*)    HCT 53.1 (*)    All  other components within normal limits  DIFFERENTIAL - Abnormal; Notable for the following:    Neutrophils Relative 83 (*)    Neutro Abs 11.9 (*)    All other components within normal limits  COMPREHENSIVE METABOLIC PANEL - Abnormal; Notable for the following:    Sodium 133 (*)    Potassium 3.4 (*)    Chloride 93 (*)    Glucose, Bld 156 (*)    BUN 48 (*)    Creatinine, Ser 4.67 (*)    Albumin 3.1 (*)    Alkaline Phosphatase 127 (*)    Total Bilirubin 0.2 (*)    GFR calc non Af Amer 14 (*)    GFR calc Af Amer 16 (*)    All other components within normal limits   No results found.   1. Gastroenteritis     Results for orders placed during the hospital encounter of 10/28/10  CBC      Component Value Range   WBC 14.4 (*) 4.0 - 10.5 (K/uL)   RBC 6.22 (*) 4.22 - 5.81 (MIL/uL)   Hemoglobin 18.0 (*) 13.0 - 17.0 (g/dL)   HCT 04.5 (*) 40.9 - 52.0 (%)   MCV 85.4  78.0 - 100.0 (fL)   MCH 28.9  26.0 - 34.0 (pg)   MCHC 33.9  30.0 - 36.0 (g/dL)   RDW 81.1  91.4 - 78.2 (%)   Platelets 327  150 - 400 (K/uL)  DIFFERENTIAL      Component Value Range   Neutrophils Relative 83 (*) 43 - 77 (%)   Neutro Abs 11.9 (*) 1.7 - 7.7 (K/uL)   Lymphocytes Relative 12  12 - 46 (%)   Lymphs Abs 1.8  0.7 - 4.0 (K/uL)   Monocytes Relative 4  3 - 12 (%)   Monocytes Absolute 0.6  0.1 - 1.0 (K/uL)   Eosinophils Relative 1  0 - 5 (%)   Eosinophils Absolute 0.1  0.0 - 0.7 (K/uL)   Basophils Relative 0  0 - 1 (%)   Basophils Absolute 0.0  0.0 - 0.1 (K/uL)  COMPREHENSIVE METABOLIC PANEL      Component Value Range   Sodium 133 (*) 135 - 145 (mEq/L)   Potassium 3.4 (*) 3.5 - 5.1 (mEq/L)   Chloride 93 (*) 96 - 112 (mEq/L)   CO2 25  19 - 32 (mEq/L)   Glucose, Bld 156 (*) 70 - 99 (mg/dL)   BUN 48 (*) 6 - 23 (mg/dL)   Creatinine, Ser 9.56 (*) 0.50 - 1.35 (mg/dL)   Calcium 9.3  8.4 - 21.3 (mg/dL)   Total Protein 7.3  6.0 - 8.3 (g/dL)   Albumin 3.1 (*) 3.5 - 5.2 (g/dL)   AST 17  0 - 37 (U/L)   ALT 12  0 -  53 (  U/L)   Alkaline Phosphatase 127 (*) 39 - 117 (U/L)   Total Bilirubin 0.2 (*) 0.3 - 1.2 (mg/dL)   GFR calc non Af Amer 14 (*) >90 (mL/min)   GFR calc Af Amer 16 (*) >90 (mL/min)   No results found. Patient states that he has improved with the fluids and pain meds he is to follow up with Dr. Renard Matter in one to 2 days history: Fluids and was noted that he has significant renal insufficiency but this has not changed much he is to follow up with [facility] to discuss this also he is followed up by a renal doctor   MDM  Vomiting and diarhea  Consistent with gastroenteritis        Benny Lennert, MD 10/28/10 2117

## 2010-10-28 NOTE — ED Notes (Signed)
Pt c/o vomiting and headache. Pt c/o abd pain and diarrhea also.

## 2010-10-28 NOTE — ED Notes (Signed)
Pt BP 201/145, physician notified orders written.

## 2010-10-29 ENCOUNTER — Other Ambulatory Visit: Payer: Self-pay

## 2010-12-07 ENCOUNTER — Encounter (HOSPITAL_COMMUNITY): Payer: Self-pay | Admitting: Emergency Medicine

## 2010-12-07 ENCOUNTER — Emergency Department (HOSPITAL_COMMUNITY): Payer: BC Managed Care – PPO

## 2010-12-07 ENCOUNTER — Emergency Department (HOSPITAL_COMMUNITY)
Admission: EM | Admit: 2010-12-07 | Discharge: 2010-12-07 | Disposition: A | Discharge status: Home / Self Care | Payer: BC Managed Care – PPO | Attending: Emergency Medicine | Admitting: Emergency Medicine

## 2010-12-07 DIAGNOSIS — J329 Chronic sinusitis, unspecified: Secondary | ICD-10-CM | POA: Insufficient documentation

## 2010-12-07 DIAGNOSIS — J029 Acute pharyngitis, unspecified: Secondary | ICD-10-CM | POA: Insufficient documentation

## 2010-12-07 DIAGNOSIS — I10 Essential (primary) hypertension: Secondary | ICD-10-CM | POA: Insufficient documentation

## 2010-12-07 DIAGNOSIS — R51 Headache: Secondary | ICD-10-CM | POA: Insufficient documentation

## 2010-12-07 DIAGNOSIS — E7800 Pure hypercholesterolemia, unspecified: Secondary | ICD-10-CM | POA: Insufficient documentation

## 2010-12-07 MED ORDER — CLONIDINE HCL 0.1 MG PO TABS
0.1000 mg | ORAL_TABLET | Freq: Once | ORAL | Status: AC
Start: 2010-12-07 — End: 2010-12-07
  Administered 2010-12-07: 0.1 mg via ORAL
  Filled 2010-12-07: qty 1

## 2010-12-07 MED ORDER — AZITHROMYCIN 250 MG PO TABS: 250.0000 mg | ORAL_TABLET | Freq: Every day | ORAL | Status: AC

## 2010-12-07 MED ORDER — HYDROCODONE-ACETAMINOPHEN 5-325 MG PO TABS: 1.0000 | ORAL_TABLET | Freq: Four times a day (QID) | ORAL | Status: AC | PRN

## 2010-12-07 NOTE — ED Notes (Signed)
Pt reports sore throat and a headache since Friday.  Reports occasional cough, denies cough being productive.  Denies fever, or chills.  Denies nausea or vomiting.

## 2010-12-07 NOTE — ED Notes (Addendum)
Patient complaining of headache and sore throat x 2 days. Denies nausea, vomiting. Denies trauma.

## 2010-12-07 NOTE — ED Provider Notes (Signed)
History     CSN: [id number] Arrival date & time: 12/07/2010  3:19 AM   First MD Initiated Contact with Patient 12/07/10 [phone]      Chief Complaint  Patient presents with  . Headache  . Sore Throat    (Consider location/radiation/quality/duration/timing/severity/associated sxs/prior treatment) Patient is a 40 y.o. male presenting with pharyngitis.  Sore Throat This is a new problem. The current episode started yesterday. The problem occurs constantly. The problem has not changed since onset.Associated symptoms include headaches. Pertinent negatives include no chest pain, no abdominal pain and no shortness of breath. The symptoms are aggravated by nothing. The symptoms are relieved by nothing. He has tried nothing for the symptoms.   Associated with a headache. No fever no nausea no vomiting no visual changes no photophobia. So patient has a yucky taste in his mouth. Mild nasal congestion. Patient has long-standing history of high blood pressure states that he is taking his medicine he is followed by a local primary care doctors.  The headache is over patient states pain is 8/10 nonradiating not made worse or better by anything.  Past Medical History  Diagnosis Date  . Hypertension   . Hypercholesteremia     History reviewed. No pertinent past surgical history.  No family history on file.  History  Substance Use Topics  . Smoking status: Never Smoker   . Smokeless tobacco: Not on file  . Alcohol Use: No      Review of Systems  Constitutional: Negative for fever and chills.  HENT: Positive for congestion and sore throat. Negative for trouble swallowing and neck pain.   Eyes: Negative for photophobia and visual disturbance.  Respiratory: Negative for cough and shortness of breath.   Cardiovascular: Negative for chest pain.  Gastrointestinal: Negative for nausea, vomiting, abdominal pain and diarrhea.  Genitourinary: Negative for dysuria and hematuria.  Musculoskeletal:  Negative for back pain.  Skin: Negative for rash.  Neurological: Positive for headaches.  Hematological: Negative for adenopathy.    Allergies  Review of patient's allergies indicates no known allergies.  Home Medications   Current Outpatient Rx  Name Route Sig Dispense Refill  . AMLODIPINE BESYLATE 5 MG PO TABS Oral Take 5 mg by mouth daily.      Marland Kitchen CARVEDILOL 3.125 MG PO TABS Oral Take 3.125 mg by mouth 2 (two) times daily with a meal.      . CLONIDINE HCL 0.1 MG PO TABS Oral Take 0.1 mg by mouth 2 (two) times daily.      Marland Kitchen CLOPIDOGREL BISULFATE 75 MG PO TABS Oral Take 75 mg by mouth daily.      . IBUPROFEN 200 MG PO TABS Oral Take 200 mg by mouth as needed. For headache pain     . SIMVASTATIN 20 MG PO TABS Oral Take 20 mg by mouth at bedtime.        BP 217/152  Pulse 106  Temp(Src) 98.2 F (36.8 C) (Oral)  Resp 20  Ht 5\' 6"  (1.676 m)  Wt 190 lb (86.183 kg)  BMI 30.67 kg/m2  SpO2 99%  Physical Exam  Nursing note and vitals reviewed. Constitutional: He is oriented to person, place, and time. He appears well-developed and well-nourished.  HENT:  Head: Normocephalic and atraumatic.  Mouth/Throat: Oropharynx is clear and moist.  Eyes: Conjunctivae and EOM are normal.  Neck: Normal range of motion. Neck supple.  Cardiovascular: Normal rate, regular rhythm and normal heart sounds.   No murmur heard. Pulmonary/Chest: Effort normal and breath  sounds normal.  Abdominal: Soft. Bowel sounds are normal. There is no tenderness.  Musculoskeletal: Normal range of motion. He exhibits no edema.  Neurological: He is alert and oriented to person, place, and time. No cranial nerve deficit. He exhibits normal muscle tone. Coordination normal.  Skin: Skin is warm and dry. No rash noted.    ED Course  Procedures (including critical care time)   Labs Reviewed  RAPID STREP SCREEN   Ct Head Wo Contrast  12/07/2010  *RADIOLOGY REPORT*  Clinical Data: Headache; sore throat.  CT HEAD  WITHOUT CONTRAST  Technique:  Contiguous axial images were obtained from the base of the skull through the vertex without contrast.  Comparison: CT of the head performed 01/26/2010, and MRI of the brain performed 01/27/2010  Findings: There is no evidence of acute infarction, mass lesion, or intra- or extra-axial hemorrhage on CT.  Scattered periventricular and subcortical white matter change likely reflects small vessel ischemic microangiopathy.  The posterior fossa, including the cerebellum, brainstem and fourth ventricle, is within normal limits.  The third and lateral ventricles, and basal ganglia are unremarkable in appearance.  The cerebral hemispheres demonstrate grossly normal gray-white differentiation.  No mass effect or midline shift is seen.  There is no evidence of fracture; visualized osseous structures are unremarkable in appearance.  The orbits are within normal limits. There is complete opacification of the left maxillary sinus, and partial opacification of the left ethmoid air cells.  The remaining paranasal sinuses and mastoid air cells are well-aerated.  No significant soft tissue abnormalities are seen.  IMPRESSION:  1.  No acute intracranial pathology seen on CT. 2.  Scattered small vessel ischemic microangiopathy. 3.  Complete opacification of the left maxillary sinus, and partial opacification of the left ethmoid air cells.  Original Report Authenticated By: Tonia Ghent, M.D.   Results for orders placed during the hospital encounter of 12/07/10  RAPID STREP SCREEN      Component Value Range   Streptococcus, Group A Screen (Direct) NEGATIVE  NEGATIVE     Impression: Pharyngitis Sinusitis Hypertension   MDM   Workup in the emergency department negative for strep. Patient's pharyngitis may be a secondary to postnasal drip from the left maxillary sinus opacification which may be consistent with sinusitis.  Blood pressure improved with the clonidine 0.1 mg orally. Patient  states that he has his blood pressure medicines at home. She has a primary care provider locally that he can followup with. Head CT otherwise negative.        Shelda Jakes, MD 12/07/10 [phone]

## 2010-12-23 ENCOUNTER — Encounter (HOSPITAL_COMMUNITY): Payer: Self-pay | Admitting: *Deleted

## 2010-12-23 ENCOUNTER — Observation Stay (HOSPITAL_COMMUNITY)
Admission: AD | Admit: 2010-12-23 | Discharge: 2010-12-26 | DRG: 331 | Disposition: A | Discharge status: Home / Self Care | Payer: BC Managed Care – PPO | Source: Ambulatory Visit | Attending: Family Medicine | Admitting: Family Medicine

## 2010-12-23 ENCOUNTER — Inpatient Hospital Stay (HOSPITAL_COMMUNITY): Payer: BC Managed Care – PPO

## 2010-12-23 DIAGNOSIS — N186 End stage renal disease: Secondary | ICD-10-CM | POA: Diagnosis present

## 2010-12-23 DIAGNOSIS — I129 Hypertensive chronic kidney disease with stage 1 through stage 4 chronic kidney disease, or unspecified chronic kidney disease: Principal | ICD-10-CM | POA: Diagnosis present

## 2010-12-23 DIAGNOSIS — N2581 Secondary hyperparathyroidism of renal origin: Secondary | ICD-10-CM | POA: Diagnosis present

## 2010-12-23 DIAGNOSIS — E870 Hyperosmolality and hypernatremia: Secondary | ICD-10-CM | POA: Diagnosis present

## 2010-12-23 DIAGNOSIS — R7989 Other specified abnormal findings of blood chemistry: Secondary | ICD-10-CM

## 2010-12-23 DIAGNOSIS — I10 Essential (primary) hypertension: Secondary | ICD-10-CM

## 2010-12-23 DIAGNOSIS — R809 Proteinuria, unspecified: Secondary | ICD-10-CM | POA: Diagnosis present

## 2010-12-23 DIAGNOSIS — N184 Chronic kidney disease, stage 4 (severe): Secondary | ICD-10-CM | POA: Diagnosis present

## 2010-12-23 DIAGNOSIS — E7800 Pure hypercholesterolemia, unspecified: Secondary | ICD-10-CM | POA: Diagnosis present

## 2010-12-23 DIAGNOSIS — I150 Renovascular hypertension: Secondary | ICD-10-CM | POA: Diagnosis present

## 2010-12-23 DIAGNOSIS — Z992 Dependence on renal dialysis: Secondary | ICD-10-CM | POA: Diagnosis present

## 2010-12-23 MED ORDER — CLOPIDOGREL BISULFATE 75 MG PO TABS: 75.0000 mg | ORAL_TABLET | Freq: Every day | ORAL | Status: DC

## 2010-12-23 MED ORDER — CLOPIDOGREL BISULFATE 75 MG PO TABS
75.0000 mg | ORAL_TABLET | Freq: Every day | ORAL | Status: DC
  Administered 2010-12-24 – 2010-12-26 (×3): 75 mg via ORAL
  Filled 2010-12-23 (×3): qty 1

## 2010-12-23 MED ORDER — CARVEDILOL 3.125 MG PO TABS
3.1250 mg | ORAL_TABLET | Freq: Two times a day (BID) | ORAL | Status: DC
  Administered 2010-12-23 – 2010-12-26 (×6): 3.125 mg via ORAL
  Filled 2010-12-23 (×6): qty 1

## 2010-12-23 MED ORDER — AMLODIPINE BESYLATE 5 MG PO TABS
5.0000 mg | ORAL_TABLET | Freq: Every day | ORAL | Status: DC
  Administered 2010-12-24 – 2010-12-25 (×2): 5 mg via ORAL
  Filled 2010-12-23 (×2): qty 1

## 2010-12-23 MED ORDER — CARVEDILOL 3.125 MG PO TABS: 3.1250 mg | ORAL_TABLET | Freq: Two times a day (BID) | ORAL | Status: DC

## 2010-12-23 MED ORDER — CLONIDINE HCL 0.1 MG PO TABS
0.1000 mg | ORAL_TABLET | Freq: Two times a day (BID) | ORAL | Status: DC
  Administered 2010-12-23 – 2010-12-24 (×3): 0.1 mg via ORAL
  Filled 2010-12-23 (×3): qty 1

## 2010-12-23 MED ORDER — HYDRALAZINE HCL 25 MG PO TABS
25.0000 mg | ORAL_TABLET | Freq: Three times a day (TID) | ORAL | Status: DC
  Administered 2010-12-23 – 2010-12-24 (×3): 25 mg via ORAL
  Filled 2010-12-23 (×3): qty 1

## 2010-12-23 MED ORDER — SODIUM CHLORIDE 0.9 % IJ SOLN
INTRAMUSCULAR | Status: AC
  Administered 2010-12-23: 10 mL
  Filled 2010-12-23: qty 3

## 2010-12-23 MED ORDER — CLONIDINE HCL 0.1 MG PO TABS: 0.1000 mg | ORAL_TABLET | Freq: Two times a day (BID) | ORAL | Status: DC

## 2010-12-23 MED ORDER — SODIUM CHLORIDE 0.45 % IV SOLN
INTRAVENOUS | Status: DC
  Administered 2010-12-23: 16:00:00 via INTRAVENOUS
  Administered 2010-12-24: 1000 mL via INTRAVENOUS
  Administered 2010-12-24: 01:00:00 via INTRAVENOUS
  Administered 2010-12-24: 1000 mL via INTRAVENOUS
  Administered 2010-12-25 (×2): via INTRAVENOUS

## 2010-12-23 MED ORDER — SIMVASTATIN 20 MG PO TABS
20.0000 mg | ORAL_TABLET | Freq: Every day | ORAL | Status: DC
  Administered 2010-12-23 – 2010-12-25 (×3): 20 mg via ORAL
  Filled 2010-12-23 (×3): qty 1

## 2010-12-23 NOTE — H&P (Signed)
NAME:  ARBOR, COHEN                  ACCOUNT NO.:  [id number]  MEDICAL RECORD NO.:  [id number]  LOCATION:  A326                          FACILITY:  [facility]  PHYSICIAN:  Irving Bloor G. Renard Matter, MD   DATE OF BIRTH:  December 05, 1970  DATE OF ADMISSION:  12/23/2010 DATE OF DISCHARGE:  LH                             HISTORY & PHYSICAL   This 40 year old Afro-American male was seen in the office several days prior to this admission with markedly elevated blood pressure and elevated creatinine.  His creatinine on last check was 7.99 with a BUN of 78.  This was up from December 12, 2010.  At that time, his BUN was 63 and creatinine was 6.93.  This patient has had in the past markedly elevated blood pressures in the range of 217/152 and this has recently been documented at this level in emergency room visit on December 07, 2010.  Subsequent to this, he was placed on hydralazine 25 mg t.i.d. and his pressures have come down some and ranged 140/80.  He does have a prior history of hypertension over a period of several years and a prior history of CVA, chronic azotemia, left ventricular hypertrophy with ejection fraction of 60%, a prior history of having been seen by [facility] January 2012.  He has had a prior history of having had ultrasound of his kidneys which showed no evidence of hydronephrosis. At that time his creatinine was 4.45.  Discussions were held with Dr. Kristian Covey.  Concerning this patient, it was recommended he be admitted to the hospital for hydration and further evaluation.  SOCIAL HISTORY:  The patient does not smoke, drink alcohol or use drugs.  FAMILY HISTORY:  Mother has hypertension but no cancer or diabetes in the family.  PAST MEDICAL HISTORY:  The patient has a history of hypertension.  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  Also he has had a prior history of CVA, does have ongoing dyslipidemia  and renal insufficiency over a period of several years.  He has severe left ventricular hypertrophy with ejection fraction of 60%.  He has had no previous surgery.  He has no allergies.  REVIEW OF SYSTEMS:  HEENT:  Negative.  CARDIOPULMONARY:  No cough, hemoptysis or dyspnea.  GI:  No nausea, vomiting, or diarrhea.  GU:  No dysuria, hematuria.  PHYSICAL EXAMINATION:  GENERAL/VITAL SIGNS:  Alert male patient with blood pressure 140/80, pulse 70, respirations 18, temp 98. HEENT:  Eyes, PERRLA.  TMs negative.  Oropharynx benign. NECK:  Supple.  No JVD or thyroid abnormalities. HEART:  Normal sinus rhythm.  No cardiomegaly or murmurs detected. LUNGS:  Clear to P and A. ABDOMEN:  No palpable organs or masses.  No organomegaly. EXTREMITIES:  Free of edema. PELVIC:  External genitalia normal.  Prostate, no enlargement. RECTAL:  Negative. NEUROLOGIC:  No focal deficit.  Cranial nerves intact.  No sensory or motor abnormalities.  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  PLAN:  To hydrate the patient.  We will obtain consultation through Nephrology Dr. Kristian Covey.  The patient might need urgent dialysis.  MEDICATION LIST: 1. Simvastatin 20 mg daily. 2. Amlodipine 10 mg daily. 3. Carvedilol 3.125 mg b.i.d. 4. Plavix 75 mg daily. 5. Clonidine 0.1 mg b.i.d. 6. Hydralazine 25 mg t.i.d.     Oluwadarasimi Favor G. Renard Matter, MD     AGM/MEDQ  D:  12/23/2010  T:  12/23/2010  Job:  [id number]

## 2010-12-23 NOTE — Consult Note (Signed)
Reason for Consult: Worsening of renal failure Referring Physician: Dr. [name] is an 40 y.o. male.  HPI: Patient with long-standing history of hypertension presently admitted because of worsening of renal failure. Mario Alexander was seen in the beginning of this year because of her renal failure and uncontrolled hypertension. During that time patient was found to have stage IV chronic renal failure thought to be secondary to hypertension . Since patient was poor followup no workup was done as her exact time consideration of renal artery stenosis was also entertained. Presently he denies any nausea vomiting however his creatinine which is present 3-4 has increased to present level of 9.  Past Medical History  Diagnosis Date  . Hypertension   . Hypercholesteremia     History reviewed. No pertinent past surgical history.  History reviewed. No pertinent family history.  Social History:  reports that he has never smoked. He does not have any smokeless tobacco history on file. He reports that he does not drink alcohol or use illicit drugs.  Allergies: No Known Allergies  Medications: I have reviewed the patient's current medications.  No results found for this or any previous visit (from the past 48 hour(s)).  No results found.  Review of Systems  Constitutional: Positive for weight loss.  Respiratory: Negative for cough and hemoptysis.   Cardiovascular: Positive for leg swelling.  Gastrointestinal: Negative for heartburn, nausea and vomiting.   Blood pressure 170/100, pulse 98, temperature 98.1 F (36.7 C), temperature source Oral, resp. rate 20, height 5\' 6"  (1.676 m), weight 79.2 kg (174 lb 9.7 oz), SpO2 94.00%. Physical Exam  Constitutional: He is oriented to person, place, and time.  Eyes: No scleral icterus.  Cardiovascular: Normal rate, regular rhythm and normal heart sounds.  Exam reveals no friction rub.   Respiratory: He has no wheezes. He has no rales.    Musculoskeletal: He exhibits edema.  Neurological: He is alert and oriented to person, place, and time.  Skin: Skin is dry.    Assessment/Plan: Problem #1 renal failure at this moment seems to be acute on chronic however natural progression of the disease cannot rule out. Since patient has uncontrolled hypertension this could be secondary to malignant hypertension. His creatinine has increased significantly the last couple of months.  Patient was for followup has persisted he was not done. Problem #2 hypertension uncontrolled mainly  because of noncompliance he'll to financial issues. Patient was not able to buy most of his medications. 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 No M spike. No workup or referral was made because the patient didn't come for his next appointment. However the situation was discussed with him or sure  how much patient complained about the issue. Problem #6 leg edema at this moment seems to be around the ankles and 1+. Problem #7 history of cardiomyopathy possibly hypertensive. Recommendation we'll do ultrasound of the kidneys to rule out obstruction We'll start hydrating patient and see if there is prerenal component and with some diuretics to improve his urine output. We'll check his basic metabolic panel, phosphorus, intact PTH, and also CBC. We will consider hemodialysis he was renal function doesn't improve as last the creatinine was 9.  Mario Alexander S 12/23/2010, 3:39 PM

## 2010-12-24 LAB — PTH, INTACT AND CALCIUM: PTH: 396.7 pg/mL — ABNORMAL HIGH (ref 14.0–72.0)

## 2010-12-24 LAB — BASIC METABOLIC PANEL
BUN: 74 mg/dL — ABNORMAL HIGH (ref 6–23)
CO2: 24 mEq/L (ref 19–32)
Chloride: 98 mEq/L (ref 96–112)
Glucose, Bld: 105 mg/dL — ABNORMAL HIGH (ref 70–99)
Potassium: 3.6 mEq/L (ref 3.5–5.1)

## 2010-12-24 LAB — PHOSPHORUS: Phosphorus: 5.1 mg/dL — ABNORMAL HIGH (ref 2.3–4.6)

## 2010-12-24 LAB — CBC
HCT: 34.6 % — ABNORMAL LOW (ref 39.0–52.0)
Hemoglobin: 11.4 g/dL — ABNORMAL LOW (ref 13.0–17.0)
MCH: 27.7 pg (ref 26.0–34.0)
MCHC: 32.9 g/dL (ref 30.0–36.0)

## 2010-12-24 MED ORDER — HYDRALAZINE HCL 25 MG PO TABS
50.0000 mg | ORAL_TABLET | Freq: Three times a day (TID) | ORAL | Status: DC
  Administered 2010-12-24 – 2010-12-26 (×6): 50 mg via ORAL
  Filled 2010-12-24 (×6): qty 2

## 2010-12-24 MED ORDER — CALCIUM ACETATE 667 MG PO CAPS
667.0000 mg | ORAL_CAPSULE | Freq: Three times a day (TID) | ORAL | Status: DC
  Administered 2010-12-24 – 2010-12-26 (×5): 667 mg via ORAL
  Filled 2010-12-24 (×5): qty 1

## 2010-12-24 NOTE — Progress Notes (Signed)
CARE MANAGEMENT NOTE 12/24/2010  Patient:  Mario Alexander, Mario Alexander   Account Number:  [id number]  Date Initiated:  12/24/2010  Documentation initiated by:  Rosemary Holms  Subjective/Objective Assessment:   Pt admitted with Hypertension/CRF.     Action/Plan:   PTA patient lived independently at home with SO. CM spoke w/ pt about concerns regarding cost of meds. Pt denies having this issue, states he always buys and takes his meds.   Anticipated DC Date:  12/26/2010   Anticipated DC Plan:  HOME/SELF CARE      DC Planning Services  CM consult      Choice offered to / List presented to:             Status of service:  In process, will continue to follow Medicare Important Message given?   (If response is "NO", the following Medicare IM given date fields will be blank) Date Medicare IM given:   Date Additional Medicare IM given:    Discharge Disposition:    Per UR Regulation:    Comments:  12/24/10 1500 Mario Alexander Leanord Hawking RN BSN CM

## 2010-12-24 NOTE — Progress Notes (Signed)
Mario Alexander  MRN: [id number]  DOB/AGE: [birth year] 40 y.o.  Primary Care Physician:Mario G, MD  Admit date: 12/23/2010  Chief Complaint: No chief complaint on file.   S-Pt presented on  12/23/2010 with .Patient with long-standing history of hypertension presently admitted because of worsening of renal failure.     Pt today feels better offers NO new complaints.  Meds    . amLODipine  5 mg Oral Daily  . carvedilol  3.125 mg Oral BID WC  . cloNIDine  0.1 mg Oral BID  . clopidogrel  75 mg Oral Q breakfast  . hydrALAZINE  50 mg Oral Q8H  . simvastatin  20 mg Oral QHS  . DISCONTD: carvedilol  3.125 mg Oral BID WC  . DISCONTD: cloNIDine  0.1 mg Oral BID  . DISCONTD: clopidogrel  75 mg Oral Daily  . DISCONTD: hydrALAZINE  25 mg Oral Q8H         NWG:NFAOZ from the symptoms mentioned above,there are no other symptoms referable to all systems reviewed.  Physical Exam: Vital signs in last 24 hours: Temp:  [97.8 F (36.6 C)-98.1 F (36.7 C)] 97.8 F (36.6 C) (11/28 0523) Pulse Rate:  [85-91] 89  (11/28 0523) Resp:  [20] 20  (11/28 0523) BP: (151-180)/(98-117) 180/110 mmHg (11/28 1450) SpO2:  [95 %-100 %] 98 % (11/28 0523) Weight change:  Last BM Date: 12/24/10  Intake/Output from previous day: 11/27 0701 - 11/28 0700 In: 600 [P.O.:600] Out: 2300 [Urine:2300] Total I/O In: 240 [P.O.:240] Out: 1100 [Urine:1100]   Physical Exam: General- pt is awake,alert, oriented to time place and person Resp- No acute REsp distress, CTA B/L NO Rhonchi CVS- S1S2 regular in rate and rhythm- NO rub GIT- BS+, soft, NT, ND EXT- NO LE Edema, Cyanosis   Lab Results: CBC  Basename 12/24/10 0526  WBC 8.4  HGB 11.4*  HCT 34.6*  PLT 353    BMET  Basename 12/24/10 0526 12/23/10 1710  NA 133* --  K 3.6 --  CL 98 --  CO2 24 --  GLUCOSE 105* --  BUN 74* --  CREATININE 8.08* --  CALCIUM 9.0 8.9       Lab Results  Component Value Date   PTH 396.7* 12/23/2010     CALCIUM 9.0 12/24/2010   PHOS 5.1* 12/24/2010   Results for Mario Alexander (MRN [id number]) as of 12/24/2010 05:04  Ref. Range 12/23/2010 17:10  Vit D, 25-Hydroxy Latest Range: 30-89 ng/mL 17 (L)    Renal US in 2009 Right Kidney: 10.4 cm  Left kidney: 11.1 cm     Renal US in 2012 Right Kidney: Right renal length is 9.4 cm.  Left Kidney: Left renal length is 9.2 cm.     Impression: 1)Renal  AKI VS CKD progression                CKD progression is likely as smaller kidneys + hx of End organ damage to other organs from HTN as well.               CKD stage 5==> ESRD .                CKD secondary to HTN                Progression of CKD rapid as uncontrolled htn                 2)HTN Target Organ damage  CKD LVH CVA  Medication- On Calcium Channel  Blockers On Alpha and beta Blockers ( Coreg) On Vasodilators- Hydralzine On Central Acting Sympatholytics- Clonidine   3)Anemia HGb at goal (9--11)                  NOT On ESA yet.  4)CKD Mineral-Bone Disorder PTH acceptable Secondary Hyperparathyroidism  present . Phosphorus at goal. Vitamin 25-OH low  5)Hyperlipidemia .  Ldl at goal PMD following  6)FEN  Normokalemic NOrmonatremic Euvolemic   7)Acid base Co2 at goal     Plan: 1)Cannot replace Vitamin d as Po4 on higher side 2) will start Phos binders 3)? recovery with crea 8.0  From 9.0 or hemodilution.-Pt will probably need renal replacement therapy 4)Will plan for pc placement  After evaluating Bmet in am  5)NO acute need for Hemodialysis today     Mario Alexander S 12/24/2010, 4:15 PM

## 2010-12-24 NOTE — Progress Notes (Signed)
NAME:  Mario Alexander, Mario Alexander                  ACCOUNT NO.:  [id number]  MEDICAL RECORD NO.:  [id number]  LOCATION:  A326                          FACILITY:  [facility]  PHYSICIAN:  Maryjane Benedict G. Renard Matter, MD   DATE OF BIRTH:  [birth year]  DATE OF PROCEDURE: DATE OF DISCHARGE:                                PROGRESS NOTE   This patient admitted to the hospital with impaired renal function, stage IV with recent creatinine of 7.99.  He does have a history of hypertension.  He is being seen by Nephrology.  OBJECTIVE:  VITAL SIGNS:  Blood pressure 177/112, respirations 20, pulse 89, temp 97.8. LUNGS:  Clear to P and A. HEART:  Regular rhythm. ABDOMEN:  No palpable organs or masses.  ASSESSMENT:  The patient does have stage IV renal failure and hypertension.  Plan to continue to monitor chemistries.  Continue hydration.  The patient is being seen by nephrologist as well.     Sidonie Dexheimer G. Renard Matter, MD     AGM/MEDQ  D:  12/24/2010  T:  12/24/2010  Job:  [id number]

## 2010-12-25 LAB — BASIC METABOLIC PANEL
BUN: 68 mg/dL — ABNORMAL HIGH (ref 6–23)
CO2: 23 mEq/L (ref 19–32)
Calcium: 9.1 mg/dL (ref 8.4–10.5)
Chloride: 98 mEq/L (ref 96–112)
Creatinine, Ser: 7.94 mg/dL — ABNORMAL HIGH (ref 0.50–1.35)
GFR calc Af Amer: 9 mL/min — ABNORMAL LOW (ref >90)
GFR calc non Af Amer: 7 mL/min — ABNORMAL LOW (ref >90)
Glucose, Bld: 105 mg/dL — ABNORMAL HIGH (ref 70–99)
Potassium: 3.5 mEq/L (ref 3.5–5.1)
Sodium: 132 mEq/L — ABNORMAL LOW (ref 135–145)

## 2010-12-25 MED ORDER — CLONIDINE HCL 0.2 MG PO TABS
0.2000 mg | ORAL_TABLET | Freq: Two times a day (BID) | ORAL | Status: DC
  Administered 2010-12-25 (×2): 0.2 mg via ORAL
  Filled 2010-12-25 (×2): qty 1

## 2010-12-25 MED ORDER — ENOXAPARIN SODIUM 40 MG/0.4ML ~~LOC~~ SOLN
40.0000 mg | SUBCUTANEOUS | Status: DC
  Administered 2010-12-25: 40 mg via SUBCUTANEOUS
  Filled 2010-12-25: qty 0.4

## 2010-12-25 MED ORDER — VITAMIN D (ERGOCALCIFEROL) 1.25 MG (50000 UNIT) PO CAPS
50000.0000 [IU] | ORAL_CAPSULE | ORAL | Status: DC
  Administered 2010-12-25: 50000 [IU] via ORAL
  Filled 2010-12-25: qty 1

## 2010-12-25 MED ORDER — CALCITRIOL 0.25 MCG PO CAPS
0.2500 ug | ORAL_CAPSULE | Freq: Every day | ORAL | Status: DC
  Administered 2010-12-25: 0.25 ug via ORAL
  Filled 2010-12-25 (×2): qty 1

## 2010-12-25 NOTE — Progress Notes (Signed)
Subjective: Interval History: has no complaint of nausea or vomiting. Her patient denies any difficulty increasing. He denies any cough he does have any fever chills or sweating. Overall her patient says that he's feeling good and he does have any complaints..  Objective: Vital signs in last 24 hours: Temp:  [97.4 F (36.3 C)-98.2 F (36.8 C)] 97.4 F (36.3 C) (11/29 4098) Pulse Rate:  [85-94] 85  (11/29 0638) Resp:  [20] 20  (11/29 1191) BP: (177-204)/(102-120) 177/102 mmHg (11/29 0638) SpO2:  [98 %-99 %] 99 % (11/29 4782) Weight change:   Intake/Output from previous day: 11/28 0701 - 11/29 0700 In: 3657.5 [P.O.:720; I.V.:2937.5] Out: 2450 [Urine:2450] Intake/Output this shift:    General appearance: alert Resp: clear to auscultation bilaterally Cardio: regular rate and rhythm, S1, S2 normal, no murmur, click, rub or gallop GI: soft, non-tender; bowel sounds normal; no masses,  no organomegaly Extremities: extremities normal, atraumatic, no cyanosis or edema  Lab Results:  [facility] 12/24/10 0526  WBC 8.4  HGB 11.4*  HCT 34.6*  PLT 353   BMET:  Basename 12/25/10 0443 12/24/10 0526  NA 132* 133*  K 3.5 3.6  CL 98 98  CO2 23 24  GLUCOSE 105* 105*  BUN 68* 74*  CREATININE 7.94* 8.08*  CALCIUM 9.1 9.0    Basename 12/23/10 1710  PTH 396.7*   Iron Studies: No results found for this basename: IRON,TIBC,TRANSFERRIN,FERRITIN in the last 72 hours  Studies/Results: US Renal  12/23/2010  *RADIOLOGY REPORT*  Clinical Data: History of renal failure.  History of renal cysts.  RENAL/URINARY TRACT ULTRASOUND COMPLETE  Comparison:  01/27/2010.  Findings:  Right Kidney:  Right renal length is 9.4 cm. 5 mm simple cyst is present in the lower midportion of the right kidney.  Left Kidney:  Left renal length is 9.2 cm.  A small simple cyst in the midportion of the left kidney measures 1.2 x 0.9 x 1.2 cm. This was also demonstrated on prior study.  Examination of each kidney shows  increased echogenicity of the renal parenchyma consistent with medical renal disease.  Examination of each kidney shows no evidence of hydronephrosis, solid mass, calculus, or parenchymal loss.  Bladder:  Bladder was incompletely distended.  No pathology was demonstrated.  IMPRESSION: No hydronephrosis.  Increased echogenicity of renal parenchyma bilaterally consistent with medical renal disease.  Small renal cysts.  Original Report Authenticated By: Crawford Givens, M.D.    I have reviewed the patient's current medications.  Assessment/Plan: Problem #1 renal failure at this moment seems to be acute on chronic his BUN is 68 and creatinine 7.94 seems to be improving. Patient is none oliguric with potassium of 3.5. Problem #2 hypertension his blood pressure seems to be somewhat better Problem #3 history of  anemia most likely secondary to his chronic renal failure. Problem #4 history of secondary hyperparathyroidism his PTH is very high.  Problem #5 history of hypercholesterolemia Problem #6 history of proteinuria in nephrotic range.  Problem #7 history of  vitamin D insufficiency Problem #8 history of hyperphosphatemia mild. Her recommendation I. will continue his the hydration I will put him on calcitriol 0.25 mcg by mouth once a day We'll start him on ergocalciferol 50,000 international units once a week. We'll check his CBC and basic metabolic panel. Possibly will discharge patient her tomorrow if his renal function remained stable and then make arrangement for catheter placement as an outpatient and consider hemodialysis.   LOS: 2 days   Rhyland Hinderliter S 12/25/2010,8:54 AM

## 2010-12-25 NOTE — Progress Notes (Signed)
NAME:  Mario Alexander, Mario Alexander                  ACCOUNT NO.:  [id number]  MEDICAL RECORD NO.:  [id number]  LOCATION:  A326                          FACILITY:  [facility]  PHYSICIAN:  Ariahna Smiddy G. Renard Matter, MD   DATE OF BIRTH:  [birth year]  DATE OF PROCEDURE: DATE OF DISCHARGE:                                PROGRESS NOTE   SUBJECTIVE:  This patient has impaired renal function and hypertension. He is being seen by Nephrology and is being considered for dialysis. His most current creatinine is 7.94 with a BUN of 68, potassium 3.5.  OBJECTIVE:  VITAL SIGNS:  Blood pressure 182/108, respirations 20, pulse 85, temp 97.9. LUNGS:  Clear to P and A. HEART:  Regular rhythm. ABDOMEN:  No palpable organs or masses.  ASSESSMENT:  The patient does have stage IV renal failure and hypertension.  PLAN:  Continue current regimen.  We will increase the dose of clonidine.     Samanvitha Germany G. Renard Matter, MD     AGM/MEDQ  D:  12/25/2010  T:  12/25/2010  Job:  [id number]

## 2010-12-26 ENCOUNTER — Other Ambulatory Visit: Payer: Self-pay | Admitting: Family Medicine

## 2010-12-26 LAB — CBC
MCHC: 32.9 g/dL (ref 30.0–36.0)
RDW: 13.5 % (ref 11.5–15.5)
WBC: 6.3 10*3/uL (ref 4.0–10.5)

## 2010-12-26 LAB — BASIC METABOLIC PANEL
BUN: 67 mg/dL — ABNORMAL HIGH (ref 6–23)
Creatinine, Ser: 8.25 mg/dL — ABNORMAL HIGH (ref 0.50–1.35)
GFR calc Af Amer: 8 mL/min — ABNORMAL LOW (ref >90)
GFR calc non Af Amer: 7 mL/min — ABNORMAL LOW (ref >90)
Potassium: 3.8 mEq/L (ref 3.5–5.1)

## 2010-12-26 MED ORDER — CALCIUM ACETATE 667 MG PO CAPS: 667.0000 mg | ORAL_CAPSULE | Freq: Three times a day (TID) | ORAL | Status: DC

## 2010-12-26 MED ORDER — HYDRALAZINE HCL 50 MG PO TABS: 50.0000 mg | ORAL_TABLET | Freq: Three times a day (TID) | ORAL | Status: DC

## 2010-12-26 MED ORDER — CALCITRIOL 0.25 MCG PO CAPS: 0.2500 ug | ORAL_CAPSULE | Freq: Every day | ORAL | Status: DC

## 2010-12-26 MED ORDER — CLONIDINE HCL 0.2 MG PO TABS: 0.2000 mg | ORAL_TABLET | Freq: Two times a day (BID) | ORAL | Status: DC

## 2010-12-26 NOTE — Progress Notes (Signed)
Discharge summary: Patient alert and oriented, saline lock removed, no complaints of distress, discharge instructions given, prescriptions given, patient verbalizes understanding of instructions. Left floor ambulatory with nursing staff and family member.

## 2010-12-26 NOTE — Discharge Summary (Signed)
NAME:  Mario Alexander, Mario Alexander                  ACCOUNT NO.:  [id number]  MEDICAL RECORD NO.:  [id number]  LOCATION:  A326                          FACILITY:  [facility]  PHYSICIAN:  Caitlin Hillmer G. Renard Matter, MD   DATE OF BIRTH:  Jan 24, 1971  DATE OF ADMISSION:  12/23/2010 DATE OF DISCHARGE:  11/30/2012LH                              DISCHARGE SUMMARY   DIAGNOSES: 1. Chronic renal failure, stage IV. 2. Hypertension.  CONDITION:  Stable and improved at the time of his discharge.  HISTORY:  This 40 year old Afro-American male was seen in the office several days prior to admission with markedly elevated blood pressure and elevated creatinine.  His creatinine prior to admission was 7.99 with a BUN of 78.  This was up from December 12, 2010.  At that time, his BUN was 63 and creatinine 6.93.  The patient has a history of markedly elevated blood pressure in the range of 217/152.  This has been recently the documented at this level in the emergency room visit on December 07, 2010.  Subsequent to this, he was placed on hydralazine 25 mg t.i.d.  His pressure has come down in the range of 145/80.  He does have a prior history of hypertension over a period of several years and prior history of CVA, chronic azotemia, left ventricular hypertrophy and ejection fraction of 60%.  He had been seen by [facility] in January 2012, history of having had an ultrasound of his kidneys which showed no evidence of hydronephrosis.  Discussions were held with Dr. Kristian Covey concerning the patient and recommended he be admitted to the hospital for hydration and further evaluation.  PHYSICAL EXAMINATION:  GENERAL:  Alert male with blood pressure 140/80, pulse 70, respirations 18, temp 98. HEENT:  Eyes, PERRLA.  TMs negative.  Oropharynx benign. NECK:  Supple.  No JVD or thyroid abnormalities. HEART:  Regular rhythm.  No murmurs.  No cardiomegaly. LUNGS:  Clear to P and A. ABDOMEN:  No palpable organs or masses.  No  organomegaly. EXTREMITIES:  Free of edema. EXTERNAL GENITALIA:  Normal.  Prostate, no enlargement.  MEDICATION LIST: 1. Simvastatin 20 mg daily. 2. Amlodipine 10 mg daily. 3. Carvedilol 3.125 mg b.i.d. 4. Plavix 75 mg daily. 5. Clonidine 0.1 mg b.i.d. 6. Hydralazine 25 mg t.i.d.  LABORATORY DATA:  Admission CBC:  WBC 8400 with a hemoglobin of 11.4, hematocrit 34.6.  Glucose 105.  PTH 396.7.  Hepatitis B surface antigen negative.  Chemistries on admission; sodium 133, potassium 3.6, chloride 98, BUN 74, creatinine 8.08, calcium 105.  Subsequent chemistries; sodium 132, potassium 3.5, chloride 98, CO2 23, and creatinine 7.94, calcium 9.1.  X-rays, ultrasound of kidneys no hydronephrosis, increased echogenicity of renal parenchyma consistent with medical renal disease.  Small renal cysts noted.  HOSPITAL COURSE:  The patient at the time of his admission was placed on half-normal saline at 125 mL/h.  He was continued on: 1. Norvasc 5 mg daily. 2. Rocaltrol 0.25 mcg daily. 3. PhosLo 667 mg 3 times a day with meals. 4. Coreg 3.125 mg twice a day. 5. Catapres 0.2 mg twice a day. 6. Apresoline 50 mg every 8 hours. 7. Plavix 75 mg daily.  8. Lovenox subcutaneously 40 mg every 24 hours. 9. Zocor 20 mg daily. 10.Vitamin D 40981 units every 7 days.  He was placed on a low-sodium diet.  He was seen in consultation by Nephrology.  They feel that secondary hyperparathyroidism is present.  They felt his anemia was most likely due to chronic renal failure.  Agreed his blood pressure was somewhat better.  We will started back on ergocalciferol 50,000 units weekly and they did recommend a repeat on his renal ultrasound, which showed no evidence of hydronephrosis, increased echogenicity of renal parenchyma bilaterally consistent with medical disease.  It was felt by Nephrology that the patient could be discharged and follow up with them with the possibility of getting him prepared for  dialysis in the very near future.  The patient is being discharged on the following medications: 1. Amlodipine 5 mg daily. 2. Rocaltrol 0.25 mg daily. 3. PhosLo 667 mg 3 times a day with meals. 4. Coreg 3.125 mg b.i.d. 5. Catapres 0.2 mg b.i.d. 6. Plavix 75 mg daily. 7. Apresoline 50 mg every 8 hours. 8. Zocor 20 mg daily. 9. Vitamin D in the form of Drisdol 50,000 units every 7 days.     Jayde Daffin G. Renard Matter, MD     AGM/MEDQ  D:  12/26/2010  T:  12/26/2010  Job:  [id number]

## 2010-12-26 NOTE — Progress Notes (Signed)
Subjective: Interval History: has no complaint of nausea or vomiting. Overall he doesn't offer any complaints.  Objective: Vital signs in last 24 hours: Temp:  [97.2 F (36.2 C)-97.9 F (36.6 C)] 97.4 F (36.3 C) (11/30 0557) Pulse Rate:  [76-84] 77  (11/30 0557) Resp:  [16-20] 16  (11/30 0557) BP: (149-185)/(98-126) 157/98 mmHg (11/30 0557) SpO2:  [97 %-100 %] 100 % (11/30 0557) Weight change:   Intake/Output from previous day: 11/29 0701 - 11/30 0700 In: 3626.3 [P.O.:720; I.V.:2906.3] Out: 1775 [Urine:1775] Intake/Output this shift:    General appearance: alert and cooperative Resp: clear to auscultation bilaterally Cardio: regular rate and rhythm, S1, S2 normal, no murmur, click, rub or gallop GI: soft, non-tender; bowel sounds normal; no masses,  no organomegaly Extremities: extremities normal, atraumatic, no cyanosis or edema  Lab Results:  [facility] 12/26/10 0602 12/24/10 0526  WBC 6.3 8.4  HGB 10.7* 11.4*  HCT 32.5* 34.6*  PLT 316 353   BMET:  Basename 12/26/10 0602 12/25/10 0443  NA 133* 132*  K 3.8 3.5  CL 99 98  CO2 23 23  GLUCOSE 106* 105*  BUN 67* 68*  CREATININE 8.25* 7.94*  CALCIUM 9.1 9.1    Basename 12/23/10 1710  PTH 396.7*   Iron Studies: No results found for this basename: IRON,TIBC,TRANSFERRIN,FERRITIN in the last 72 hours  Studies/Results: No results found.  I have reviewed the patient's current medications.  Assessment/Plan: Problem #1 renal failure at this moment seems to be  chronic patient pending creatinine seems to be increasing. He is none oliguric. Patient denies any nausea vomiting. Problem #2 hyponatremia sodium 163 improving Problem number of 3 history of hypertension his blood pressure seems to be controlled very well. Problem #4 secondary hyperparathyroidism is related to his renal failure. Problem #5 history of hypercholesterolemia. Problem #6 history of vitamin D insufficiency patient is started on vitamin D  supplement. Problem #7 history of anemia his hemoglobin and hematocrit slightly low possibly related to his renal failure. Recommendation I will make arrangements for patient to have a catheter possibly next week and the we'll consider initiating hemodialysis. Her we'll discuss with the Dr. Megan Mans for PPD placement on Monday. We'll continue his other medications as before.    LOS: 3 days   Mario Alexander S 12/26/2010,8:28 AM

## 2010-12-30 ENCOUNTER — Other Ambulatory Visit: Payer: Self-pay | Admitting: *Deleted

## 2010-12-30 NOTE — Progress Notes (Signed)
UR Chart Review Completed  

## 2010-12-30 NOTE — Progress Notes (Signed)
CARE MANAGEMENT NOTE 12/30/2010  Patient:  Mario Alexander   Account Number:  [id number]  Date Initiated:  12/30/2010  Documentation initiated by:  Rosemary Holms  Subjective/Objective Assessment:   Pt admitted after R Inguinal hernia repair. PTA lived at home with family.  After surgery pt became bradycardia and cardiac consult ordered. Pt is self pay and requested assistance from the [facility].     Action/Plan:   Per Silva Bandy at [facility], patient is 100% [facility] and [place] usually does these consults. Pt advised, RN advised   Anticipated DC Date:  12/30/2010   Anticipated DC Plan:  HOME/SELF CARE  In-house referral  Financial Counselor      DC Planning Services  CM consult      Choice offered to / List presented to:             Status of service:  Completed, signed off Medicare Important Message given?   (If response is "NO", the following Medicare IM given date fields will be blank) Date Medicare IM given:   Date Additional Medicare IM given:    Discharge Disposition:  HOME/SELF CARE  Per UR Regulation:    Comments:  12/30/10 09:30 Val Farnam Leanord Hawking RN BSN CM Pf discharged with no HH needs identified.

## 2011-01-01 ENCOUNTER — Encounter (HOSPITAL_COMMUNITY): Payer: Self-pay

## 2011-01-01 ENCOUNTER — Encounter (HOSPITAL_COMMUNITY)
Admission: RE | Admit: 2011-01-01 | Discharge: 2011-01-01 | Disposition: A | Discharge status: Home / Self Care | Payer: BC Managed Care – PPO | Source: Ambulatory Visit | Attending: Surgery | Admitting: Surgery

## 2011-01-01 ENCOUNTER — Encounter (HOSPITAL_COMMUNITY)
Admission: RE | Admit: 2011-01-01 | Discharge: 2011-01-01 | Disposition: A | Discharge status: Home / Self Care | Payer: BC Managed Care – PPO | Source: Ambulatory Visit | Attending: Anesthesiology | Admitting: Anesthesiology

## 2011-01-01 LAB — SURGICAL PCR SCREEN
MRSA, PCR: NEGATIVE
Staphylococcus aureus: NEGATIVE

## 2011-01-01 MED ORDER — DEXTROSE 5 % IV SOLN
1.5000 g | INTRAVENOUS | Status: AC
  Administered 2011-01-02: 1.5 g via INTRAVENOUS
  Filled 2011-01-01: qty 1.5

## 2011-01-01 NOTE — Progress Notes (Signed)
CXR reviewed

## 2011-01-01 NOTE — Progress Notes (Signed)
EKG 10/2010 ECHO 01/2010

## 2011-01-01 NOTE — Pre-Procedure Instructions (Addendum)
[name]  01/01/2011   Your procedure is scheduled on:  Friday, Dec 7  Report to [facility] at 0830 AM.  Call this number if you have problems the morning of surgery: [phone]   Remember:   Do not eat food:After Midnight.  May have clear liquids: up to 4 Hours before arrival.  Clear liquids include soda, tea, black coffee, apple or grape juice, broth.  Take these medicines the morning of surgery with A SIP OF WATER: Clonidine and Apressaline   Do not wear jewelry, make-up or nail polish.  Do not wear lotions, powders, or perfumes. You may wear deodorant.  Do not shave 48 hours prior to surgery.  Do not bring valuables to the hospital.  Contacts, dentures or bridgework may not be worn into surgery.  Leave suitcase in the car. After surgery it may be brought to your room.  For patients admitted to the hospital, checkout time is 11:00 AM the day of discharge.   Patients discharged the day of surgery will not be allowed to drive home.  Name and phone number of your driver: Spouse  Special Instructions: CHG Shower Use Special Wash: 1/2 bottle night before surgery and 1/2 bottle morning of surgery.   Please read over the following fact sheets that you were given: MRSA Information and Surgical Site Infection Prevention

## 2011-01-01 NOTE — Progress Notes (Signed)
Primary md- Dr Memory Argue @ [facility], [place], [place] [phone]

## 2011-01-02 ENCOUNTER — Ambulatory Visit (HOSPITAL_COMMUNITY)
Admission: RE | Admit: 2011-01-02 | Discharge: 2011-01-02 | Disposition: A | Discharge status: Home / Self Care | Payer: BC Managed Care – PPO | Source: Ambulatory Visit | Attending: Surgery | Admitting: Surgery

## 2011-01-02 ENCOUNTER — Ambulatory Visit (HOSPITAL_COMMUNITY): Payer: BC Managed Care – PPO

## 2011-01-02 ENCOUNTER — Ambulatory Visit (HOSPITAL_COMMUNITY): Payer: BC Managed Care – PPO | Admitting: Critical Care Medicine

## 2011-01-02 ENCOUNTER — Other Ambulatory Visit: Payer: Self-pay | Admitting: Vascular Surgery

## 2011-01-02 ENCOUNTER — Encounter (HOSPITAL_COMMUNITY): Payer: Self-pay | Admitting: *Deleted

## 2011-01-02 ENCOUNTER — Encounter (HOSPITAL_COMMUNITY): Payer: Self-pay | Admitting: Critical Care Medicine

## 2011-01-02 ENCOUNTER — Encounter (HOSPITAL_COMMUNITY)
Admission: RE | Disposition: A | Discharge status: Home / Self Care | Payer: Self-pay | Source: Ambulatory Visit | Attending: Surgery

## 2011-01-02 DIAGNOSIS — I120 Hypertensive chronic kidney disease with stage 5 chronic kidney disease or end stage renal disease: Secondary | ICD-10-CM | POA: Insufficient documentation

## 2011-01-02 DIAGNOSIS — N179 Acute kidney failure, unspecified: Secondary | ICD-10-CM

## 2011-01-02 DIAGNOSIS — N186 End stage renal disease: Secondary | ICD-10-CM

## 2011-01-02 DIAGNOSIS — Z01818 Encounter for other preprocedural examination: Secondary | ICD-10-CM | POA: Insufficient documentation

## 2011-01-02 DIAGNOSIS — E7800 Pure hypercholesterolemia, unspecified: Secondary | ICD-10-CM | POA: Insufficient documentation

## 2011-01-02 DIAGNOSIS — Z4901 Encounter for fitting and adjustment of extracorporeal dialysis catheter: Secondary | ICD-10-CM | POA: Insufficient documentation

## 2011-01-02 HISTORY — PX: INSERTION OF DIALYSIS CATHETER: SHX1324

## 2011-01-02 LAB — POCT I-STAT 4, (NA,K, GLUC, HGB,HCT)
Hemoglobin: 12.2 g/dL — ABNORMAL LOW (ref 13.0–17.0)
Potassium: 4.8 mEq/L (ref 3.5–5.1)
Sodium: 138 mEq/L (ref 135–145)

## 2011-01-02 SURGERY — INSERTION OF DIALYSIS CATHETER
Anesthesia: Monitor Anesthesia Care | Site: Neck | Wound class: Clean

## 2011-01-02 MED ORDER — LABETALOL HCL 5 MG/ML IV SOLN
5.0000 mg | INTRAVENOUS | Status: DC | PRN
  Administered 2011-01-02 (×2): 5 mg via INTRAVENOUS
  Filled 2011-01-02 (×2): qty 4

## 2011-01-02 MED ORDER — ONDANSETRON HCL 4 MG/2ML IJ SOLN
INTRAMUSCULAR | Status: DC | PRN
  Administered 2011-01-02: 4 mg via INTRAVENOUS

## 2011-01-02 MED ORDER — SODIUM CHLORIDE 0.9 % IR SOLN
Status: DC | PRN
  Administered 2011-01-02: 1000 mL

## 2011-01-02 MED ORDER — HEPARIN SODIUM (PORCINE) 1000 UNIT/ML IJ SOLN
INTRAMUSCULAR | Status: DC | PRN
  Administered 2011-01-02: 4.2 mL

## 2011-01-02 MED ORDER — FENTANYL CITRATE 0.05 MG/ML IJ SOLN
INTRAMUSCULAR | Status: DC | PRN
  Administered 2011-01-02: 50 ug via INTRAVENOUS

## 2011-01-02 MED ORDER — SODIUM CHLORIDE 0.9 % IV SOLN
INTRAVENOUS | Status: DC
  Administered 2011-01-02 (×2): via INTRAVENOUS

## 2011-01-02 MED ORDER — LACTATED RINGERS IV SOLN: INTRAVENOUS | Status: DC

## 2011-01-02 MED ORDER — MIDAZOLAM HCL 5 MG/5ML IJ SOLN
INTRAMUSCULAR | Status: DC | PRN
  Administered 2011-01-02: 2 mg via INTRAVENOUS

## 2011-01-02 MED ORDER — HYDROMORPHONE HCL PF 1 MG/ML IJ SOLN
0.2500 mg | INTRAMUSCULAR | Status: DC | PRN
  Administered 2011-01-02: 0.5 mg via INTRAVENOUS

## 2011-01-02 MED ORDER — OXYCODONE-ACETAMINOPHEN 5-500 MG PO CAPS: 1.0000 | ORAL_CAPSULE | ORAL | Status: AC | PRN

## 2011-01-02 MED ORDER — LABETALOL HCL 5 MG/ML IV SOLN
5.0000 mg | Freq: Once | INTRAVENOUS | Status: DC
  Filled 2011-01-02: qty 1

## 2011-01-02 MED ORDER — LABETALOL HCL 5 MG/ML IV SOLN
5.0000 mg | INTRAVENOUS | Status: DC | PRN
  Filled 2011-01-02 (×2): qty 1

## 2011-01-02 MED ORDER — MIDAZOLAM HCL 2 MG/2ML IJ SOLN: 0.5000 mg | Freq: Once | INTRAMUSCULAR | Status: DC | PRN

## 2011-01-02 MED ORDER — PROPOFOL 10 MG/ML IV EMUL
INTRAVENOUS | Status: DC | PRN
  Administered 2011-01-02: 100 ug/kg/min via INTRAVENOUS

## 2011-01-02 MED ORDER — SODIUM CHLORIDE 0.9 % IR SOLN
Status: DC | PRN
  Administered 2011-01-02: 14:00:00

## 2011-01-02 MED ORDER — ONDANSETRON HCL 4 MG/2ML IJ SOLN: 4.0000 mg | Freq: Once | INTRAMUSCULAR | Status: DC | PRN

## 2011-01-02 MED ORDER — LIDOCAINE-EPINEPHRINE (PF) 1 %-1:200000 IJ SOLN
INTRAMUSCULAR | Status: DC | PRN
  Administered 2011-01-02: 14 mL

## 2011-01-02 SURGICAL SUPPLY — 43 items
BAG DECANTER FOR FLEXI CONT (MISCELLANEOUS) ×2 IMPLANT
CATH CANNON HEMO 15F 50CM (CATHETERS) IMPLANT
CATH CANNON HEMO 15FR 19 (HEMODIALYSIS SUPPLIES) ×2 IMPLANT
CATH CANNON HEMO 15FR 23CM (HEMODIALYSIS SUPPLIES) IMPLANT
CATH CANNON HEMO 15FR 31CM (HEMODIALYSIS SUPPLIES) IMPLANT
CATH CANNON HEMO 15FR 32CM (HEMODIALYSIS SUPPLIES) IMPLANT
CLOTH BEACON ORANGE TIMEOUT ST (SAFETY) ×2 IMPLANT
COVER PROBE W GEL 5X96 (DRAPES) ×2 IMPLANT
DERMABOND ADVANCED (GAUZE/BANDAGES/DRESSINGS) ×1
DERMABOND ADVANCED .7 DNX12 (GAUZE/BANDAGES/DRESSINGS) ×1 IMPLANT
DRAPE C-ARM 42X72 X-RAY (DRAPES) ×2 IMPLANT
DRAPE CHEST BREAST 15X10 FENES (DRAPES) ×2 IMPLANT
GAUZE SPONGE 2X2 8PLY STRL LF (GAUZE/BANDAGES/DRESSINGS) ×1 IMPLANT
GAUZE SPONGE 4X4 16PLY XRAY LF (GAUZE/BANDAGES/DRESSINGS) ×2 IMPLANT
GLOVE BIOGEL PI IND STRL 7.0 (GLOVE) ×1 IMPLANT
GLOVE BIOGEL PI IND STRL 7.5 (GLOVE) ×1 IMPLANT
GLOVE BIOGEL PI INDICATOR 7.0 (GLOVE) ×1
GLOVE BIOGEL PI INDICATOR 7.5 (GLOVE) ×1
GLOVE SS BIOGEL STRL SZ 7 (GLOVE) ×1 IMPLANT
GLOVE SUPERSENSE BIOGEL SZ 7 (GLOVE) ×1
GLOVE SURG SS PI 7.5 STRL IVOR (GLOVE) ×2 IMPLANT
GOWN PREVENTION PLUS XXLARGE (GOWN DISPOSABLE) ×2 IMPLANT
GOWN STRL NON-REIN LRG LVL3 (GOWN DISPOSABLE) ×2 IMPLANT
KIT BASIN OR (CUSTOM PROCEDURE TRAY) ×2 IMPLANT
KIT ROOM TURNOVER OR (KITS) ×2 IMPLANT
NEEDLE 18GX1X1/2 (RX/OR ONLY) (NEEDLE) ×2 IMPLANT
NEEDLE HYPO 25GX1X1/2 BEV (NEEDLE) ×2 IMPLANT
NS IRRIG 1000ML POUR BTL (IV SOLUTION) ×2 IMPLANT
PACK SURGICAL SETUP 50X90 (CUSTOM PROCEDURE TRAY) ×2 IMPLANT
PAD ARMBOARD 7.5X6 YLW CONV (MISCELLANEOUS) ×4 IMPLANT
SOAP 2 % CHG 4 OZ (WOUND CARE) ×2 IMPLANT
SPONGE GAUZE 2X2 STER 10/PKG (GAUZE/BANDAGES/DRESSINGS) ×1
SUT ETHILON 3 0 PS 1 (SUTURE) ×2 IMPLANT
SUT VICRYL 4-0 PS2 18IN ABS (SUTURE) ×2 IMPLANT
SYR 20CC LL (SYRINGE) ×4 IMPLANT
SYR 30ML LL (SYRINGE) IMPLANT
SYR 5ML LL (SYRINGE) ×2 IMPLANT
SYR CONTROL 10ML LL (SYRINGE) ×2 IMPLANT
SYRINGE 10CC LL (SYRINGE) ×2 IMPLANT
TAPE CLOTH SURG 4X10 WHT LF (GAUZE/BANDAGES/DRESSINGS) ×2 IMPLANT
TOWEL OR 17X24 6PK STRL BLUE (TOWEL DISPOSABLE) ×2 IMPLANT
TOWEL OR 17X26 10 PK STRL BLUE (TOWEL DISPOSABLE) ×2 IMPLANT
WATER STERILE IRR 1000ML POUR (IV SOLUTION) ×2 IMPLANT

## 2011-01-02 NOTE — Op Note (Signed)
OPERATIVE REPORT  Date of Surgery: 01/02/2011  Surgeon: Josephina Gip, MD  Assistant nurse Pre-op Diagnosis: ESRD  Post-op Diagnosis: Same  Procedure: Procedure(s): INSERTION OF DIALYSIS CATHETER-19 cm via right internal jugular vein Bilateral ultrasound localization internal jugular veins  Anesthesia: General  EBL: Minimal  Complications: None  Procedure Details: Patient taken to the operating room placed in supine position the upper chest and neck were exposed. Both internal jugular veins are imaged using B-mode ultrasound using the SonoSite machine. Both were noted to be widely patent. After prepping and draping in routine sterile manner the right internal jugular vein was entered using a supraclavicular approach guidewire passed into the right atrium under fluoroscopic guidance. After dilating the track appropriately and 19 cm hemodialysis catheter was passed through a peel-away sheath positioned in the right atrium tunneled peripherally and secured with nylon sutures. The wound was closed with Vicryl in a subcuticular fashion. Sterile dressing applied patient taken to the recovery room in stable condition for chest x-ray .   Josephina Gip, MD 01/02/2011 2:39 PM

## 2011-01-02 NOTE — Transfer of Care (Signed)
Immediate Anesthesia Transfer of Care Note  Patient: Mario Alexander  Procedure(s) Performed:  INSERTION OF DIALYSIS CATHETER - Insertion of Dialysis catheter Right Internal Jugular with 24cm dialysis catheter  Patient Location: PACU  Anesthesia Type: MAC  Level of Consciousness: awake, alert  and oriented  Airway & Oxygen Therapy: Patient Spontanous Breathing and Patient connected to face mask oxygen  Post-op Assessment: Report given to PACU RN, Post -op Vital signs reviewed and stable and Patient moving all extremities X 4  Post vital signs: Reviewed and stable  Complications: No apparent anesthesia complications

## 2011-01-02 NOTE — Anesthesia Postprocedure Evaluation (Signed)
  Anesthesia Post-op Note  Patient: Mario Alexander  Procedure(s) Performed:  INSERTION OF DIALYSIS CATHETER - Insertion of Dialysis catheter Right Internal Jugular with 24cm dialysis catheter  Patient Location: PACU  Anesthesia Type: MAC  Level of Consciousness: awake and alert   Airway and Oxygen Therapy: Patient Spontanous Breathing  Post-op Pain: mild  Post-op Assessment: Post-op Vital signs reviewed and Patient's Cardiovascular Status Stable  Post-op Vital Signs: stable  Complications: No apparent anesthesia complications

## 2011-01-02 NOTE — Anesthesia Preprocedure Evaluation (Addendum)
Anesthesia Evaluation  Patient identified by MRN, date of birth, ID band Patient awake    Reviewed: Allergy & Precautions, H&P , NPO status , Patient's Chart, lab work & pertinent test results  Airway Mallampati: III TM Distance: >3 FB Neck ROM: Full    Dental  (+) Teeth Intact and Dental Advisory Given   Pulmonary  clear to auscultation        Cardiovascular Regular Normal    Neuro/Psych    GI/Hepatic   Endo/Other    Renal/GU Renal disease     Musculoskeletal   Abdominal   Peds  Hematology   Anesthesia Other Findings   Reproductive/Obstetrics                           Anesthesia Physical Anesthesia Plan  ASA: III  Anesthesia Plan: MAC   Post-op Pain Management:    Induction: Intravenous  Airway Management Planned:   Additional Equipment:   Intra-op Plan:   Post-operative Plan:   Informed Consent: I have reviewed the patients History and Physical, chart, labs and discussed the procedure including the risks, benefits and alternatives for the proposed anesthesia with the patient or authorized representative who has indicated his/her understanding and acceptance.   Dental advisory given  Plan Discussed with: CRNA and Surgeon  Anesthesia Plan Comments: (New onset ESRD has not yet started HD K+ 4.8 HTN Cardiomyopathy per records)        Anesthesia Quick Evaluation

## 2011-01-02 NOTE — Preoperative (Signed)
Beta Blockers   Reason not to administer Beta Blockers:Not Applicable 

## 2011-01-02 NOTE — Consult Note (Signed)
  Vascular Surgery Consultation  Reason for Consult: End-stage renal disease  HPI: Mario Alexander is a 40 y.o. male who presents for evaluation of chronic renal insufficiency-needs hemodialysis cath. This patient has not yet been on hemodialysis and now needs a dialysis catheter inserted and then will return to our office for evaluation for permanent access   Past Medical History  Diagnosis Date  . Hypertension   . Hypercholesteremia   . Chronic kidney disease   . ESRD (end stage renal disease) 11-2010   History reviewed. No pertinent past surgical history. History   Social History  . Marital Status: Married    Spouse Name: N/A    Number of Children: N/A  . Years of Education: N/A   Social History Main Topics  . Smoking status: Never Smoker   . Smokeless tobacco: None  . Alcohol Use: No  . Drug Use: No  . Sexually Active:    Other Topics Concern  . None   Social History Narrative  . None   History reviewed. No pertinent family history. No Known Allergies Prior to Admission medications   Medication Sig Start Date End Date Taking? Authorizing Provider  calcitRIOL (ROCALTROL) 0.25 MCG capsule Take 1 capsule (0.25 mcg total) by mouth daily. 12/26/10 12/26/11 Yes Angus G McInnis  calcium acetate (PHOSLO) 667 MG capsule Take 1 capsule (667 mg total) by mouth 3 (three) times daily with meals. 12/26/10 12/26/11 Yes Angus G McInnis  cloNIDine (CATAPRES) 0.2 MG tablet Take 1 tablet (0.2 mg total) by mouth 2 (two) times daily. 12/26/10 12/26/11 Yes Angus G McInnis  hydrALAZINE (APRESOLINE) 50 MG tablet Take 50 mg by mouth every 6 (six) hours.   12/26/10 12/26/11 Yes Angus G McInnis     Positive ROS: He denies chest pain, dyspnea on exertion, PND, orthopnea, lower extremity claudication, he does have a history of hypertension and hyperlipidemia denies any other abnormal symptoms   All other systems have been reviewed and were otherwise negative with the exception of those  mentioned in the HPI and as above.  Physical Exam: Filed Vitals:   01/02/11 0801  BP: 158/99  Pulse: 71  Temp: 97.9 F (36.6 C)  Resp: 20    General: Alert, no acute distress HEENT: Normal for age Cardiovascular: Regular rate and rhythm. Carotid pulses 2+, no bruits audible Respiratory: Clear to auscultation. No cyanosis, no use of accessory musculature GI: No organomegaly, abdomen is soft and non-tender Skin: No lesions in the area of chief complaint Neurologic: Sensation intact distally Psychiatric: Patient is competent for consent with normal mood and affect Musculoskeletal: No obvious deformities Extremities: Left upper extremity has an IV in place. Both upper extremities have 3+ brachial and radial pulses palpable. Surface veins are readily noted.       Assessment/Plan: Patient with end-stage renal disease needs hemodialysis catheter today and to be evaluated in the near future for permanent access  Josephina Gip, MD 01/02/2011 1:50 PM

## 2011-01-05 ENCOUNTER — Encounter: Payer: Self-pay | Admitting: Vascular Surgery

## 2011-01-06 ENCOUNTER — Encounter (HOSPITAL_COMMUNITY): Payer: Self-pay | Admitting: Vascular Surgery

## 2011-01-07 ENCOUNTER — Encounter: Payer: Self-pay | Admitting: Vascular Surgery

## 2011-01-08 ENCOUNTER — Ambulatory Visit (INDEPENDENT_AMBULATORY_CARE_PROVIDER_SITE_OTHER): Payer: BC Managed Care – PPO | Admitting: Vascular Surgery

## 2011-01-08 ENCOUNTER — Other Ambulatory Visit: Payer: Self-pay

## 2011-01-08 ENCOUNTER — Other Ambulatory Visit (INDEPENDENT_AMBULATORY_CARE_PROVIDER_SITE_OTHER): Payer: BC Managed Care – PPO

## 2011-01-08 ENCOUNTER — Encounter: Payer: Self-pay | Admitting: Vascular Surgery

## 2011-01-08 VITALS — BP 185/130 | HR 83 | Temp 97.9°F | Ht 66.0 in | Wt 166.0 lb

## 2011-01-08 DIAGNOSIS — N186 End stage renal disease: Secondary | ICD-10-CM

## 2011-01-08 NOTE — Progress Notes (Signed)
[facility] HISTORY AND PHYSICAL   History of Present Illness:  Patient is a 40 y.o. year old male who presents for placement of a permanent hemodialysis access. The patient is right handed .  The patient is currently on hemodialysis.  The cause of renal failure is thought to be secondary to hypertension.  Other chronic medical problems include elevated cholesterol. These problems are all currently stable and followed by his nephrologist. He is currently dialyzing via a right-sided Diatek catheter placed by Dr. Hart Rochester on December 7.  Past Medical History  Diagnosis Date  . Hypertension   . Hypercholesteremia   . Chronic kidney disease   . ESRD (end stage renal disease) 11-2010    Past Surgical History  Procedure Date  . Insertion of dialysis catheter 01/02/2011    Procedure: INSERTION OF DIALYSIS CATHETER;  Surgeon: Pryor Ochoa, MD;  Location[facility] OR;  Service: Vascular;  Laterality: N/A;  Insertion of Dialysis catheter Right Internal Jugular with 24cm dialysis catheter     Social History History  Substance Use Topics  . Smoking status: Never Smoker   . Smokeless tobacco: Not on file  . Alcohol Use: No    Family History Family History  Problem Relation Age of Onset  . Diabetes Mother   . Hypertension Mother   . Hyperlipidemia Mother   . COPD Father     Allergies  No Known Allergies   Current Outpatient Prescriptions  Medication Sig Dispense Refill  . calcitRIOL (ROCALTROL) 0.25 MCG capsule Take 1 capsule (0.25 mcg total) by mouth daily.  30 capsule  5  . calcium acetate (PHOSLO) 667 MG capsule Take 1 capsule (667 mg total) by mouth 3 (three) times daily with meals.  90 capsule  5  . cloNIDine (CATAPRES) 0.2 MG tablet Take 1 tablet (0.2 mg total) by mouth 2 (two) times daily.  60 tablet  5  . hydrALAZINE (APRESOLINE) 50 MG tablet Take 50 mg by mouth every 6 (six) hours.        Marland Kitchen oxyCODONE-acetaminophen (TYLOX) 5-500 MG per capsule Take 1  capsule by mouth every 4 (four) hours as needed for pain.  30 capsule  0  . amLODipine (NORVASC) 10 MG tablet       . azithromycin (ZITHROMAX) 250 MG tablet         ROS:   General:  No weight loss, Fever, chills  HEENT: No recent headaches, no nasal bleeding, no visual changes, no sore throat  Cardiac: No recent episodes of chest pain/pressure, no shortness of breath at rest.  No shortness of breath with exertion.  Denies history of atrial fibrillation or irregular heartbeat  Pulmonary: No home oxygen, no productive cough, no hemoptysis,  No asthma or wheezing  Physical Examination  Filed Vitals:   01/08/11 1449  BP: 185/130  Pulse: 83  Temp: 97.9 F (36.6 C)  TempSrc: Oral  Height: 5\' 6"  (1.676 m)  Weight: 166 lb (75.297 kg)    Body mass index is 26.79 kg/(m^2).  General:  Alert and oriented, no acute distress HEENT: Normal Pulmonary: Clear to auscultation bilaterally Cardiac: Regular Rate and Rhythm without murmur Skin: No rash Extremity Pulses:  2+ radial, brachial pulses bilaterally Musculoskeletal: No deformity or edema   DATA: The patient had a vein mapping ultrasound performed today which I reviewed and interpreted. He has no adequate cephalic vein bilaterally for fistula. The basilic vein does seem to be a reasonable quality between 20 and 60 mm in  diameter on the left and 20 and 50 mm in diameter on the right.   ASSESSMENT: Patient needs long-term hemodialysis access   PLAN: She'll be scheduled for a left basilic vein transposition fistula versus AV graft. This is scheduled for December 28 with Dr. Hart Rochester since the patient was unavailable due to a social services appointment on my next available OR date.   Risk, benefits, and alternatives to access surgery were discussed.  The patient is aware the risks include but are not limited to: bleeding, infection, steal syndrome, nerve damage, ischemic neuropathy, failure to mature, and need for additional  procedures.  The patient agrees to proceed.  Fabienne Bruns, MD [facility] Office: [phone] Pager: [phone]

## 2011-01-11 ENCOUNTER — Emergency Department (HOSPITAL_COMMUNITY)
Admission: EM | Admit: 2011-01-11 | Discharge: 2011-01-11 | Disposition: A | Discharge status: Home / Self Care | Payer: BC Managed Care – PPO | Attending: Emergency Medicine | Admitting: Emergency Medicine

## 2011-01-11 ENCOUNTER — Emergency Department (HOSPITAL_COMMUNITY): Payer: BC Managed Care – PPO

## 2011-01-11 ENCOUNTER — Encounter (HOSPITAL_COMMUNITY): Payer: Self-pay

## 2011-01-11 DIAGNOSIS — E7800 Pure hypercholesterolemia, unspecified: Secondary | ICD-10-CM | POA: Insufficient documentation

## 2011-01-11 DIAGNOSIS — K6289 Other specified diseases of anus and rectum: Secondary | ICD-10-CM | POA: Insufficient documentation

## 2011-01-11 DIAGNOSIS — Z79899 Other long term (current) drug therapy: Secondary | ICD-10-CM | POA: Insufficient documentation

## 2011-01-11 DIAGNOSIS — N186 End stage renal disease: Secondary | ICD-10-CM | POA: Insufficient documentation

## 2011-01-11 DIAGNOSIS — I120 Hypertensive chronic kidney disease with stage 5 chronic kidney disease or end stage renal disease: Secondary | ICD-10-CM | POA: Insufficient documentation

## 2011-01-11 DIAGNOSIS — K5641 Fecal impaction: Secondary | ICD-10-CM | POA: Insufficient documentation

## 2011-01-11 DIAGNOSIS — K5900 Constipation, unspecified: Secondary | ICD-10-CM | POA: Insufficient documentation

## 2011-01-11 LAB — OCCULT BLOOD, POC DEVICE: Fecal Occult Bld: NEGATIVE

## 2011-01-11 MED ORDER — PEG 3350-KCL-NABCB-NACL-NASULF 236 G PO SOLR: ORAL | Status: DC

## 2011-01-11 NOTE — ED Provider Notes (Signed)
Medical screening examination/treatment/procedure(s) were performed by non-physician practitioner and as supervising physician I was immediately available for consultation/collaboration.  Hurman Horn, MD 01/11/11 [phone]

## 2011-01-11 NOTE — ED Provider Notes (Signed)
History     CSN: [id number] Arrival date & time: 01/11/2011 10:20 AM   First MD Initiated Contact with Patient 01/11/11 1022      Chief Complaint  Patient presents with  . Constipation    (Consider location/radiation/quality/duration/timing/severity/associated sxs/prior treatment) HPI Comments: Patient c/o constipation for several days.  States his last BM was 3 days ago and was a small amt of hard, dry stools at that time.  States he feels urge today to have a BM, and has been to the bathroom without results.  He has not tried any OTC stool softeners or laxatives at home.  He denies abd pain, bloody stools, fever or vomiting.    Patient is a 40 y.o. male presenting with constipation. The history is provided by the patient.  Constipation  The current episode started 3 to 5 days ago. The onset was gradual. The problem occurs continuously. The problem has been unchanged. The patient is experiencing no pain. The stool is described as hard. There was no prior successful therapy. There was no prior unsuccessful therapy. Pertinent negatives include no fever, no abdominal pain, no hematemesis, no nausea, no rectal pain, no vomiting, no hematuria, no chest pain, no difficulty breathing and no rash. He has been behaving normally. He has been eating and drinking normally. His past medical history does not include abdominal surgery, recent abdominal injury or recent change in diet. There were no sick contacts.    Past Medical History  Diagnosis Date  . Hypertension   . Hypercholesteremia   . Chronic kidney disease   . ESRD (end stage renal disease) 11-2010    Past Surgical History  Procedure Date  . Insertion of dialysis catheter 01/02/2011    Procedure: INSERTION OF DIALYSIS CATHETER;  Surgeon: Pryor Ochoa, MD;  Location: [facility] OR;  Service: Vascular;  Laterality: N/A;  Insertion of Dialysis catheter Right Internal Jugular with 24cm dialysis catheter    Family History  Problem Relation Age  of Onset  . Diabetes Mother   . Hypertension Mother   . Hyperlipidemia Mother   . COPD Father     History  Substance Use Topics  . Smoking status: Never Smoker   . Smokeless tobacco: Not on file  . Alcohol Use: No      Review of Systems  Constitutional: Negative for fever, chills and fatigue.  Respiratory: Negative for shortness of breath.   Cardiovascular: Negative for chest pain.  Gastrointestinal: Positive for constipation. Negative for nausea, vomiting, abdominal pain, blood in stool, rectal pain and hematemesis.  Genitourinary: Negative for dysuria, hematuria, flank pain and difficulty urinating.  Musculoskeletal: Negative for myalgias, back pain and arthralgias.  Skin: Negative for rash.  Neurological: Negative for dizziness, weakness and numbness.  Hematological: Does not bruise/bleed easily.  All other systems reviewed and are negative.    Allergies  Review of patient's allergies indicates no known allergies.  Home Medications   Current Outpatient Rx  Name Route Sig Dispense Refill  . AMLODIPINE BESYLATE 10 MG PO TABS      . AZITHROMYCIN 250 MG PO TABS      . CALCITRIOL 0.25 MCG PO CAPS Oral Take 1 capsule (0.25 mcg total) by mouth daily. 30 capsule 5  . CALCIUM ACETATE 667 MG PO CAPS Oral Take 1 capsule (667 mg total) by mouth 3 (three) times daily with meals. 90 capsule 5  . CLONIDINE HCL 0.2 MG PO TABS Oral Take 1 tablet (0.2 mg total) by mouth 2 (two) times daily. 60  tablet 5  . HYDRALAZINE HCL 50 MG PO TABS Oral Take 50 mg by mouth every 6 (six) hours.      . OXYCODONE-ACETAMINOPHEN 5-500 MG PO CAPS Oral Take 1 capsule by mouth every 4 (four) hours as needed for pain. 30 capsule 0    BP 185/105  Pulse 75  Temp(Src) 98.3 F (36.8 C) (Oral)  Resp 18  Ht 5\' 6"  (1.676 m)  Wt 170 lb (77.111 kg)  BMI 27.44 kg/m2  SpO2 100%  Physical Exam  Nursing note and vitals reviewed. Constitutional: He is oriented to person, place, and time. He appears  well-developed and well-nourished. No distress.  HENT:  Head: Normocephalic and atraumatic.  Mouth/Throat: Oropharynx is clear and moist.  Neck: Normal range of motion.  Cardiovascular: Normal rate, regular rhythm and normal heart sounds.   Pulmonary/Chest: Effort normal and breath sounds normal. No respiratory distress.  Abdominal: Soft. He exhibits no distension and no mass. There is no tenderness. There is no rebound and no guarding.  Genitourinary: Rectal exam shows tenderness. Rectal exam shows no external hemorrhoid, no fissure and anal tone normal. Guaiac negative stool.       Lonon, heme negative hard stool present in the rectal vault.    Musculoskeletal: Normal range of motion. He exhibits no edema and no tenderness.  Neurological: He is alert and oriented to person, place, and time. No cranial nerve deficit. He exhibits normal muscle tone. Coordination normal.  Skin: Skin is warm and dry.  Psychiatric: He has a normal mood and affect.    ED Course  Procedures (including critical care time)  Labs Reviewed - No data to display Dg Abd 1 View  01/11/2011  *RADIOLOGY REPORT*  Clinical Data: Constipation.  Abdominal pain.  ABDOMEN - 1 VIEW  Comparison: None.  Findings: Single view of the abdomen demonstrates moderate amount of stool throughout distribution of the colon, especially in the rectosigmoid.  There is mild distension of the central small bowel loops, consistent with early or partial small bowel obstruction. Alternatively, the findings could be related to focal ileus.  IMPRESSION:  1.  Single view, showing findings of early or partial small bowel obstruction, possibly related to obstipation. 2.  Moderate stool burden, especially within the rectosigmoid colon.  Original Report Authenticated By: Patterson Hammersmith, M.D.        MDM   Abd is soft, NT, no guarding, distention or peritoneal signs.  Pt mucous membranes are moist.  Denies vomiting.  Large amt of hard stool  present, likely fecal impaction.    3:01 PM I have disimpacted pt, he is feeling better, moderate amt of stool was removed.  ABd remains soft , NT.  I will prescribe GoLitely and he agrees to take at home.      Zennie Ayars L. Gentry Pilson, [place] 01/11/11 1502

## 2011-01-11 NOTE — ED Notes (Signed)
PT c/o constipation.  Says feel like has to have  A bm but can't.  Reports last BM was Thursday but was hard.  Denies any abd pain.

## 2011-01-15 ENCOUNTER — Other Ambulatory Visit (HOSPITAL_COMMUNITY): Payer: Self-pay

## 2011-01-15 NOTE — Procedures (Unsigned)
CEPHALIC VEIN MAPPING  INDICATION:  Preoperative vein mapping for hemodialysis access.  HISTORY: End-stage renal disease, hypertension, chronic kidney disease.  EXAM: The visualized segments of the right cephalic vein are compressible, although thick walls are present.  Diameter measurements range from 0.14 to 0.25 cm.  The right basilic vein is compressible.  Diameter measurements range from 0.22 to 0.50 cm.  The visualized segments of the left cephalic vein are compressible, although thick walls are present.  Diameter measurements range from 0.21 cm to 0.21.  The left basilic vein is compressible.  Diameter measurements range from 0.16 to 0.59 cm.  See attached worksheet for all measurements.  IMPRESSION: 1. Patent bilateral basilic veins with diameter measurements as     described above. 2. Thick walled, sclerotic cephalic veins bilaterally.  ___________________________________________ Janetta Hora Fields, MD  CI/MEDQ  D:  01/08/2011  T:  01/08/2011  Job:  [id number]

## 2011-01-21 ENCOUNTER — Telehealth: Payer: Self-pay | Admitting: *Deleted

## 2011-01-21 NOTE — Telephone Encounter (Signed)
Called to confirm Mr Lebeck's surgery for 01/23/11 but he said he was cancelling it. He is thinking about a second opinion in [place]. I told him to call us back if he needed to reschedule.

## 2011-01-23 ENCOUNTER — Ambulatory Visit (HOSPITAL_COMMUNITY)
Admission: RE | Admit: 2011-01-23 | Payer: BC Managed Care – PPO | Source: Ambulatory Visit | Admitting: Vascular Surgery

## 2011-01-23 ENCOUNTER — Encounter (HOSPITAL_COMMUNITY): Admission: RE | Payer: Self-pay | Source: Ambulatory Visit

## 2011-01-23 SURGERY — ARTERIOVENOUS (AV) FISTULA CREATION
Anesthesia: General | Site: Arm Lower | Laterality: Left

## 2011-01-31 ENCOUNTER — Other Ambulatory Visit (HOSPITAL_COMMUNITY): Payer: Self-pay | Admitting: Nephrology

## 2011-01-31 DIAGNOSIS — N186 End stage renal disease: Secondary | ICD-10-CM

## 2011-02-02 ENCOUNTER — Ambulatory Visit (HOSPITAL_COMMUNITY)
Admission: RE | Admit: 2011-02-02 | Discharge: 2011-02-02 | Disposition: A | Discharge status: Home / Self Care | Payer: BC Managed Care – PPO | Source: Ambulatory Visit | Attending: Nephrology | Admitting: Nephrology

## 2011-02-02 ENCOUNTER — Encounter (HOSPITAL_COMMUNITY): Payer: Self-pay

## 2011-02-02 DIAGNOSIS — N186 End stage renal disease: Secondary | ICD-10-CM

## 2011-02-02 DIAGNOSIS — Y849 Medical procedure, unspecified as the cause of abnormal reaction of the patient, or of later complication, without mention of misadventure at the time of the procedure: Secondary | ICD-10-CM | POA: Insufficient documentation

## 2011-02-02 DIAGNOSIS — E7800 Pure hypercholesterolemia, unspecified: Secondary | ICD-10-CM | POA: Insufficient documentation

## 2011-02-02 DIAGNOSIS — I120 Hypertensive chronic kidney disease with stage 5 chronic kidney disease or end stage renal disease: Secondary | ICD-10-CM | POA: Insufficient documentation

## 2011-02-02 DIAGNOSIS — Z992 Dependence on renal dialysis: Secondary | ICD-10-CM | POA: Insufficient documentation

## 2011-02-02 DIAGNOSIS — T82898A Other specified complication of vascular prosthetic devices, implants and grafts, initial encounter: Secondary | ICD-10-CM | POA: Insufficient documentation

## 2011-02-02 MED ORDER — CEFAZOLIN SODIUM 1-5 GM-% IV SOLN
1.0000 g | Freq: Once | INTRAVENOUS | Status: AC
  Administered 2011-02-02: 1 g via INTRAVENOUS

## 2011-02-02 MED ORDER — CHLORHEXIDINE GLUCONATE 4 % EX LIQD
CUTANEOUS | Status: AC
  Filled 2011-02-02: qty 30

## 2011-02-02 MED ORDER — SODIUM CHLORIDE 0.9 % IV SOLN: INTRAVENOUS | Status: DC

## 2011-02-02 MED ORDER — CEFAZOLIN SODIUM 1-5 GM-% IV SOLN
INTRAVENOUS | Status: AC
  Administered 2011-02-02: 1 g via INTRAVENOUS
  Filled 2011-02-02: qty 50

## 2011-02-02 MED ORDER — HEPARIN SODIUM (PORCINE) 1000 UNIT/ML IJ SOLN
INTRAMUSCULAR | Status: AC
  Filled 2011-02-02: qty 1

## 2011-02-02 NOTE — H&P (Signed)
Mario Alexander is an 41 y.o. male.   Chief Complaint: Existing Dialysis catheter is clotted; failed attempts to open in dialysis center with saline and heparin HPI: scheduled now for dialysis catheter exchange  Past Medical History  Diagnosis Date  . Hypertension   . Hypercholesteremia   . Chronic kidney disease   . ESRD (end stage renal disease) 11-2010    Past Surgical History  Procedure Date  . Insertion of dialysis catheter 01/02/2011    Procedure: INSERTION OF DIALYSIS CATHETER;  Surgeon: Pryor Ochoa, MD;  Location: [facility] OR;  Service: Vascular;  Laterality: N/A;  Insertion of Dialysis catheter Right Internal Jugular with 24cm dialysis catheter    Family History  Problem Relation Age of Onset  . Diabetes Mother   . Hypertension Mother   . Hyperlipidemia Mother   . COPD Father    Social History:  reports that he has never smoked. He does not have any smokeless tobacco history on file. He reports that he does not drink alcohol or use illicit drugs.  Allergies: No Known Allergies  Medications Prior to Admission  Medication Sig Dispense Refill  . calcitRIOL (ROCALTROL) 0.25 MCG capsule Take 1 capsule (0.25 mcg total) by mouth daily.  30 capsule  5  . calcium acetate (PHOSLO) 667 MG capsule Take 1 capsule (667 mg total) by mouth 3 (three) times daily with meals.  90 capsule  5  . cloNIDine (CATAPRES) 0.2 MG tablet Take 1 tablet (0.2 mg total) by mouth 2 (two) times daily.  60 tablet  5  . hydrALAZINE (APRESOLINE) 50 MG tablet Take 50 mg by mouth every 8 (eight) hours.       . multivitamin (RENA-VIT) TABS tablet Take 1 tablet by mouth daily.        . polyethylene glycol (GOLYTELY) 236 G solution Drink one quart today, if no improvement  then one quart tomorrow , continue until relief.  4000 mL  0   No current facility-administered medications on file as of 02/02/2011.    No results found for this or any previous visit (from the past 48 hour(s)). No results found.  Review of  Systems  Constitutional: Negative for fever.  Respiratory: Negative for cough.   Cardiovascular: Negative for chest pain.  Neurological: Negative for headaches.    There were no vitals taken for this visit. Physical Exam  Constitutional: He is oriented to person, place, and time. He appears well-developed and well-nourished.  HENT:  Head: Normocephalic.  Eyes: EOM are normal.  Neck: Normal range of motion.  Cardiovascular: Normal rate, regular rhythm and normal heart sounds.   No murmur heard. Respiratory: Effort normal and breath sounds normal. He has no wheezes.  GI: Soft. Bowel sounds are normal. There is no tenderness.  Musculoskeletal: Normal range of motion.  Neurological: He is alert and oriented to person, place, and time.  Skin: Skin is warm and dry.     Assessment/Plan Dialysis catheter is clotted; unable to open at Dialysis center using saline and heparin. Scheduled now for dialysis catheter exchange. Pt aware of procedure benefits and risks and agreeable to proceed. Consent signed.  Airyonna Franklyn A 02/02/2011, 7:40 AM

## 2011-02-02 NOTE — Procedures (Signed)
Successful exchange of right IJ approach dialysis catheter with tip at superior aspect of the right atrium. The catheter is ready for immediate use. No immediate post procedural complications.

## 2011-02-18 ENCOUNTER — Ambulatory Visit: Payer: Self-pay | Admitting: Vascular Surgery

## 2011-02-18 LAB — BASIC METABOLIC PANEL
Anion Gap: 14 (ref 7–16)
BUN: 58 mg/dL — ABNORMAL HIGH (ref 7–18)
Calcium, Total: 9.4 mg/dL (ref 8.5–10.1)
Chloride: 100 mmol/L (ref 98–107)
Co2: 24 mmol/L (ref 21–32)
EGFR (African American): 7 — ABNORMAL LOW
Osmolality: 292 (ref 275–301)
Potassium: 4 mmol/L (ref 3.5–5.1)

## 2011-03-29 ENCOUNTER — Encounter (HOSPITAL_COMMUNITY): Payer: Self-pay

## 2011-03-29 ENCOUNTER — Emergency Department (HOSPITAL_COMMUNITY)
Admission: EM | Admit: 2011-03-29 | Discharge: 2011-03-29 | Disposition: A | Discharge status: Home / Self Care | Payer: BC Managed Care – PPO | Attending: Emergency Medicine | Admitting: Emergency Medicine

## 2011-03-29 DIAGNOSIS — Z992 Dependence on renal dialysis: Secondary | ICD-10-CM | POA: Insufficient documentation

## 2011-03-29 DIAGNOSIS — I120 Hypertensive chronic kidney disease with stage 5 chronic kidney disease or end stage renal disease: Secondary | ICD-10-CM | POA: Insufficient documentation

## 2011-03-29 DIAGNOSIS — R51 Headache: Secondary | ICD-10-CM | POA: Insufficient documentation

## 2011-03-29 DIAGNOSIS — Z79899 Other long term (current) drug therapy: Secondary | ICD-10-CM | POA: Insufficient documentation

## 2011-03-29 DIAGNOSIS — N186 End stage renal disease: Secondary | ICD-10-CM | POA: Insufficient documentation

## 2011-03-29 DIAGNOSIS — E7800 Pure hypercholesterolemia, unspecified: Secondary | ICD-10-CM | POA: Insufficient documentation

## 2011-03-29 DIAGNOSIS — R110 Nausea: Secondary | ICD-10-CM | POA: Insufficient documentation

## 2011-03-29 MED ORDER — KETOROLAC TROMETHAMINE 30 MG/ML IJ SOLN
30.0000 mg | Freq: Once | INTRAMUSCULAR | Status: AC
  Administered 2011-03-29: 30 mg via INTRAVENOUS
  Filled 2011-03-29: qty 1

## 2011-03-29 MED ORDER — METOCLOPRAMIDE HCL 5 MG/ML IJ SOLN
10.0000 mg | Freq: Once | INTRAMUSCULAR | Status: AC
  Administered 2011-03-29: 10 mg via INTRAVENOUS
  Filled 2011-03-29: qty 2

## 2011-03-29 MED ORDER — DIPHENHYDRAMINE HCL 50 MG/ML IJ SOLN
25.0000 mg | Freq: Once | INTRAMUSCULAR | Status: AC
  Administered 2011-03-29: 25 mg via INTRAVENOUS
  Filled 2011-03-29: qty 1

## 2011-03-29 NOTE — ED Notes (Signed)
Pt c/o headache on the left side of his head since 1000 am. Alert and oriented x 3. Skin warm and dry. Color pink. No nausea or vomiting at this time.

## 2011-03-29 NOTE — ED Notes (Signed)
Pt presents with headache that started today. Pt states he took a Tylox with no relief.

## 2011-03-29 NOTE — ED Provider Notes (Signed)
History   This chart was scribed for [employer]. Colon Branch, MD by Clarita Crane. The patient was seen in room APA15/APA15. Patient's care was started at 1137.    CSN: [id number]  Arrival date & time 03/29/11  1137   First MD Initiated Contact with Patient 03/29/11 1218      Chief Complaint  Patient presents with  . Headache    (Consider location/radiation/quality/duration/timing/severity/associated sxs/prior treatment) HPI Mario Alexander is a 41 y.o. male who presents to the Emergency Department complaining of waxing and waning moderate HA described as sharp onset 2.5 hours ago and persistent since with associated nausea. Patient notes HA not relieved with use of oxycodone. Reports previous h/o HAs but states this one is localized to left side of head as opposed to posterior aspect of head. States HA is aggravated with lying head down on left side of head. Patient with h/o HTN, hypercholesterolemia, chronic kidney disease, end stage renal disease an is on hemodialysis (scheduled tuesdays, thursday and saturdays).  Past Medical History  Diagnosis Date  . Hypertension   . Hypercholesteremia   . Chronic kidney disease   . ESRD (end stage renal disease) 11-2010    Past Surgical History  Procedure Date  . Insertion of dialysis catheter 01/02/2011    Procedure: INSERTION OF DIALYSIS CATHETER;  Surgeon: Pryor Ochoa, MD;  Location: [facility] OR;  Service: Vascular;  Laterality: N/A;  Insertion of Dialysis catheter Right Internal Jugular with 24cm dialysis catheter    Family History  Problem Relation Age of Onset  . Diabetes Mother   . Hypertension Mother   . Hyperlipidemia Mother   . COPD Father     History  Substance Use Topics  . Smoking status: Never Smoker   . Smokeless tobacco: Not on file  . Alcohol Use: No      Review of Systems 10 Systems reviewed and are negative for acute change except as noted in the HPI.  Allergies  Review of patient's allergies indicates no known  allergies.  Home Medications   Current Outpatient Rx  Name Route Sig Dispense Refill  . CALCITRIOL 0.25 MCG PO CAPS Oral Take 1 capsule (0.25 mcg total) by mouth daily. 30 capsule 5  . CALCIUM ACETATE 667 MG PO CAPS Oral Take 1 capsule (667 mg total) by mouth 3 (three) times daily with meals. 90 capsule 5  . CLONIDINE HCL 0.2 MG PO TABS Oral Take 1 tablet (0.2 mg total) by mouth 2 (two) times daily. 60 tablet 5  . HYDRALAZINE HCL 50 MG PO TABS Oral Take 50 mg by mouth every 8 (eight) hours.     Marland Kitchen RENA-VITE PO TABS Oral Take 1 tablet by mouth daily.      Marland Kitchen PEG 3350-KCL-NABCB-NACL-NASULF 236 G PO SOLR  Drink one quart today, if no improvement  then one quart tomorrow , continue until relief. 4000 mL 0    BP 136/57  Pulse 68  Temp(Src) 97.5 F (36.4 C) (Oral)  Resp 20  Ht 5\' 6"  (1.676 m)  Wt 160 lb (72.576 kg)  BMI 25.82 kg/m2  SpO2 100%  Physical Exam  Nursing note and vitals reviewed. Constitutional: He is oriented to person, place, and time. He appears well-developed and well-nourished. No distress.  HENT:  Head: Normocephalic and atraumatic.  Eyes: EOM are normal. Pupils are equal, round, and reactive to light.  Neck: Neck supple. No tracheal deviation present.  Cardiovascular: Normal rate and regular rhythm.  Exam reveals no gallop and no friction  rub.   No murmur heard. Pulmonary/Chest: Effort normal. No respiratory distress. He has no wheezes. He has no rales.  Abdominal: Soft. He exhibits no distension.  Musculoskeletal: Normal range of motion. He exhibits no edema.       Left forearm AV fistula with good bruit and thrill.   Neurological: He is alert and oriented to person, place, and time. No cranial nerve deficit or sensory deficit.       Grip strength normal and equal bilaterally.   Skin: Skin is warm and dry.  Psychiatric: He has a normal mood and affect. His behavior is normal.    ED Course  Procedures (including critical care time)  DIAGNOSTIC STUDIES: Oxygen  Saturation is 100% on room air, normal by my interpretation.    COORDINATION OF CARE: 12:39PM- Patient informed of current plan for treatment and evaluation and agrees with plan at this time.  2:21PM- Patient notes HA improved at this time. Will d/c home. Patient agreeable.       MDM  Patient presents with an intermittent stabbing headache to the left side of his head.Given IVF, analgesic and antiemetic with relief.  Pt feels improved after observation and/or treatment in ED.Pt stable in ED with no significant deterioration in condition.The patient appears reasonably screened and/or stabilized for discharge and I doubt any other medical condition or other [facility] requiring further screening, evaluation, or treatment in the ED at this time prior to discharge.  I personally performed the services described in this documentation, which was scribed in my presence. The recorded information has been reviewed and considered.   MDM Reviewed: nursing note and vitals       Nicoletta Dress. Colon Branch, MD 03/29/11 1433

## 2011-04-10 ENCOUNTER — Encounter (HOSPITAL_COMMUNITY): Payer: Self-pay

## 2011-04-10 ENCOUNTER — Emergency Department (HOSPITAL_COMMUNITY)
Admission: EM | Admit: 2011-04-10 | Discharge: 2011-04-10 | Disposition: A | Discharge status: Home / Self Care | Payer: BC Managed Care – PPO | Attending: Emergency Medicine | Admitting: Emergency Medicine

## 2011-04-10 DIAGNOSIS — I120 Hypertensive chronic kidney disease with stage 5 chronic kidney disease or end stage renal disease: Secondary | ICD-10-CM | POA: Insufficient documentation

## 2011-04-10 DIAGNOSIS — K5641 Fecal impaction: Secondary | ICD-10-CM | POA: Insufficient documentation

## 2011-04-10 DIAGNOSIS — N186 End stage renal disease: Secondary | ICD-10-CM | POA: Insufficient documentation

## 2011-04-10 DIAGNOSIS — E7800 Pure hypercholesterolemia, unspecified: Secondary | ICD-10-CM | POA: Insufficient documentation

## 2011-04-10 MED ORDER — POLYETHYLENE GLYCOL 3350 17 GM/SCOOP PO POWD: 17.0000 g | Freq: Two times a day (BID) | ORAL | Status: AC

## 2011-04-10 NOTE — ED Provider Notes (Cosign Needed)
History   This chart was scribed for Mario Cooper III, MD by Charolett Bumpers . The patient was seen in room APA07/APA07 and the patient's care was started at 4:53pm.   CSN: [id number]  Arrival date & time 04/10/11  1415   First MD Initiated Contact with Patient 04/10/11 1648      Chief Complaint  Patient presents with  . Constipation    (Consider location/radiation/quality/duration/timing/severity/associated sxs/prior treatment) HPI EVERARDO VORIS is a 41 y.o. male who presents to the Emergency Department complaining of constant, moderate constipation for the past several days. Patient states that he took a laxative yesterday and had a BM yesterday afterwards. Today, the patient states that he "feels like the stool is stuck", and has been unable to have a BM. Patient denies abdominal pain. Patient states that he is a dialysis patient, T/TH/SAT, and has a catheter in his chest. Patient reports a h/o hypertension chronic kidney disease and ESRD. No other associated symptoms reported.    Past Medical History  Diagnosis Date  . Hypertension   . Hypercholesteremia   . Chronic kidney disease   . ESRD (end stage renal disease) 11-2010    Past Surgical History  Procedure Date  . Insertion of dialysis catheter 01/02/2011    Procedure: INSERTION OF DIALYSIS CATHETER;  Surgeon: Pryor Ochoa, MD;  Location: [facility] OR;  Service: Vascular;  Laterality: N/A;  Insertion of Dialysis catheter Right Internal Jugular with 24cm dialysis catheter    Family History  Problem Relation Age of Onset  . Diabetes Mother   . Hypertension Mother   . Hyperlipidemia Mother   . COPD Father     History  Substance Use Topics  . Smoking status: Never Smoker   . Smokeless tobacco: Not on file  . Alcohol Use: No      Review of Systems  Constitutional: Negative for fever.  Gastrointestinal: Positive for constipation. Negative for abdominal pain.  All other systems reviewed and are  negative.    Allergies  Review of patient's allergies indicates no known allergies.  Home Medications   Current Outpatient Rx  Name Route Sig Dispense Refill  . CALCITRIOL 0.25 MCG PO CAPS Oral Take 1 capsule (0.25 mcg total) by mouth daily. 30 capsule 5  . CALCIUM ACETATE 667 MG PO CAPS Oral Take 1 capsule (667 mg total) by mouth 3 (three) times daily with meals. 90 capsule 5  . CLONIDINE HCL 0.2 MG PO TABS Oral Take 1 tablet (0.2 mg total) by mouth 2 (two) times daily. 60 tablet 5  . HYDRALAZINE HCL 50 MG PO TABS Oral Take 50 mg by mouth every 8 (eight) hours.     Marland Kitchen RENA-VITE PO TABS Oral Take 1 tablet by mouth daily.      Marland Kitchen PEG 3350-KCL-NABCB-NACL-NASULF 236 G PO SOLR  Drink one quart today, if no improvement  then one quart tomorrow , continue until relief. 4000 mL 0    BP 135/75  Pulse 115  Temp(Src) 97.8 F (36.6 C) (Oral)  Resp 24  Ht 5\' 6"  (1.676 m)  Wt 165 lb (74.844 kg)  BMI 26.63 kg/m2  SpO2 96%  Physical Exam  Nursing note and vitals reviewed. Constitutional: He is oriented to person, place, and time. He appears well-developed and well-nourished. No distress.  HENT:  Head: Normocephalic and atraumatic.  Right Ear: External ear normal.  Left Ear: External ear normal.  Nose: Nose normal.  Eyes: EOM are normal. Pupils are equal, round, and reactive to  light.  Neck: Normal range of motion. Neck supple. No tracheal deviation present.  Cardiovascular: Normal rate, regular rhythm and normal heart sounds.   Pulmonary/Chest: Effort normal and breath sounds normal. No respiratory distress.  Abdominal: Soft. He exhibits no distension.  Musculoskeletal: Normal range of motion. He exhibits no edema.  Neurological: He is alert and oriented to person, place, and time. No sensory deficit.  Skin: Skin is warm and dry.  Psychiatric: He has a normal mood and affect. His behavior is normal.    ED Course  Fecal disimpaction Performed by: Osvaldo Human Authorized by:  Osvaldo Human Consent: Verbal consent obtained. Risks and benefits: risks, benefits and alternatives were discussed Consent given by: patient Patient understanding: patient states understanding of the procedure being performed Patient consent: the patient's understanding of the procedure matches consent given Procedure consent: procedure consent matches procedure scheduled Site marked: the operative site was not marked Patient identity confirmed: verbally with patient Time out: Immediately prior to procedure a "time out" was called to verify the correct patient, procedure, equipment, support staff and site/side marked as required. Local anesthesia used: no Patient sedated: no Patient tolerance: Patient tolerated the procedure well with no immediate complications. Comments: Digital disimpaction, with removal of a number of hard fecal scyballa, with significant patient relief.   (including critical care time)  DIAGNOSTIC STUDIES: Oxygen Saturation is 96% on room air, adequate by my interpretation.    COORDINATION OF CARE:  1653: Discussed planned course of treatment with the patient and patient is agreeable at this time.  1658: Manual digital disimpaction of the stool performed, and informed patient of planned d/c.    1. Fecal impaction      I personally performed the services described in this documentation, which was scribed in my presence. The recorded information has been reviewed and considered.  Osvaldo Human, M.D.     Mario Cooper III, MD 04/11/11 [phone]

## 2011-04-10 NOTE — ED Notes (Signed)
Had bm yesterday, took lax. This afternoon, feels like stool is stuck.  Unable to sit in triage --if i sit it will come out

## 2011-04-10 NOTE — Discharge Instructions (Signed)
Fecal Impaction A fecal impaction happens when there is a large, firm amount of stool (poop) that cannot be passed. The impacted stool is usually in the rectum, which is the lowest part of the large bowel. The impacted stool can block the colon and cause significant problems. CAUSES  The longer stool stays in the rectum, the harder it gets. Anything that slows down your bowel movements can lead to fecal impaction. These conditions include:  Constipation (having firm hard stools). This can be a longstanding (chronic) problem, or can happen suddenly (acutely).   Painful conditions of the rectum, such as hemorrhoids or anal fissures. The pain of these conditions can make you try to avoid having bowel movements.   Narcotic pain medications cause constipation, which can sometimes be severe. If you take narcotic pain medication, you should also talk with your caregiver about preventing constipation.   Not getting enough fluids.   Inactivity and bed rest over long periods of time.  SYMPTOMS  Some symptoms of fecal impaction include:  Lack of normal bowel movements or changes in bowel patterns.   Sense of fullness in the rectum, but unable to pass stool.   Pain or cramps in the stomach or abdominal area (often after meals).   Thin, watery discharge from the rectum.  Without treatment, a fecal impaction can block the colon and cause severe abdominal pain or colon tears (perforation). This may lead to surgery.  DIAGNOSIS  Fecal impaction is suspected based upon your symptoms and upon a physical examination. This will include an exam of your rectum, which can confirm the diagnosis. Sometimes x-rays or lab testing may be needed to confirm the diagnosis and be sure there are no other problems.  TREATMENT   Initially an impaction can be removed manually. Your caregiver, using a gloved finger, can remove hard stool from your rectum.   Medication is sometimes needed. A suppository or enema can be  given in the rectum to soften the stool. This can stimulate a bowel movement. Medicines can also be given by mouth (orally).   Surgery may be needed if the colon has torn (perforated) due to blockage. This is very rare.  HOME CARE INSTRUCTIONS   Develop regular bowel habits. This may be something such as getting in the habit of having a bowel movement after your morning cup of coffee or after eating. Be sure to allow yourself enough time on the toilet.   Maintain a high fiber diet.   Drink plenty of fluids each day. This is especially true for the elderly and especially during cold weather when thirst may not be as strong. Try to take in at least eight, 8 ounce glasses of water daily unless instructed otherwise.   Exercise regularly.   If you begin to get constipated, increase the amount of fiber in your diet. Eat plenty of fruits, vegetables, whole wheat breads, bran, oatmeal and similar products.   Natural fiber laxatives such as Metamucil are also very helpful.   Speak with your caregiver if you suspect medications may be causing constipation.  SEEK MEDICAL CARE IF:   You have ongoing constipation or a hard time passing your stools.   You have ongoing rectal pain.   You require enemas or suppositories more than twice a week.  SEEK IMMEDIATE MEDICAL CARE IF:   There are continued problems or you develop abdominal pain.   You develop rectal bleeding.   You develop black or tarry stools or feel lightheaded.  MAKE SURE YOU:     Understand these instructions.   Will watch your condition.   Will get help right away if you are not doing well or get worse.  Document Released: 10/05/2003 Document Revised: 01/01/2011 Document Reviewed: 09/22/2007 [employer] Patient Information 2012 [employer]. 

## 2011-04-10 NOTE — ED Notes (Signed)
Pt in bathroom at this time.

## 2011-05-04 ENCOUNTER — Emergency Department (HOSPITAL_COMMUNITY)
Admission: EM | Admit: 2011-05-04 | Discharge: 2011-05-04 | Disposition: A | Discharge status: Home / Self Care | Payer: Medicare Other | Attending: Emergency Medicine | Admitting: Emergency Medicine

## 2011-05-04 ENCOUNTER — Encounter (HOSPITAL_COMMUNITY): Payer: Self-pay | Admitting: *Deleted

## 2011-05-04 DIAGNOSIS — Z79899 Other long term (current) drug therapy: Secondary | ICD-10-CM | POA: Insufficient documentation

## 2011-05-04 DIAGNOSIS — IMO0002 Reserved for concepts with insufficient information to code with codable children: Secondary | ICD-10-CM | POA: Insufficient documentation

## 2011-05-04 DIAGNOSIS — E7800 Pure hypercholesterolemia, unspecified: Secondary | ICD-10-CM | POA: Insufficient documentation

## 2011-05-04 DIAGNOSIS — I120 Hypertensive chronic kidney disease with stage 5 chronic kidney disease or end stage renal disease: Secondary | ICD-10-CM | POA: Insufficient documentation

## 2011-05-04 DIAGNOSIS — L0291 Cutaneous abscess, unspecified: Secondary | ICD-10-CM

## 2011-05-04 DIAGNOSIS — N186 End stage renal disease: Secondary | ICD-10-CM | POA: Insufficient documentation

## 2011-05-04 MED ORDER — LIDOCAINE HCL (PF) 1 % IJ SOLN
INTRAMUSCULAR | Status: AC
  Administered 2011-05-04: 04:00:00
  Filled 2011-05-04: qty 5

## 2011-05-04 NOTE — Discharge Instructions (Signed)

## 2011-05-04 NOTE — ED Notes (Signed)
Patient complaining of bug bite to back, states does not hurt.

## 2011-05-04 NOTE — ED Provider Notes (Signed)
History     CSN: [id number]  Arrival date & time 05/04/11  0214   First MD Initiated Contact with Patient 05/04/11 0249      Chief Complaint  Patient presents with  . Insect Bite    (Consider location/radiation/quality/duration/timing/severity/associated sxs/prior treatment) HPI Location left scapular region. No radiation. Quality is sharp pain. Duration about 2 days since he first noticed this. Patient worried he may have been bitten by something but did not see a spider or bug or insect. Pain is been persistent and worsening. No history of abscess in the past. Dialysis patient without any difficulty breathing, fevers or vomiting. Severity mild to moderate. Started as a small bump and now enlarging. No drainage. Past Medical History  Diagnosis Date  . Hypertension   . Hypercholesteremia   . Chronic kidney disease   . ESRD (end stage renal disease) 11-2010    Past Surgical History  Procedure Date  . Insertion of dialysis catheter 01/02/2011    Procedure: INSERTION OF DIALYSIS CATHETER;  Surgeon: Pryor Ochoa, MD;  Location: [facility] OR;  Service: Vascular;  Laterality: N/A;  Insertion of Dialysis catheter Right Internal Jugular with 24cm dialysis catheter    Family History  Problem Relation Age of Onset  . Diabetes Mother   . Hypertension Mother   . Hyperlipidemia Mother   . COPD Father     History  Substance Use Topics  . Smoking status: Never Smoker   . Smokeless tobacco: Not on file  . Alcohol Use: No      Review of Systems  Constitutional: Negative for fever and chills.  HENT: Negative for neck pain and neck stiffness.   Eyes: Negative for pain.  Respiratory: Negative for shortness of breath.   Cardiovascular: Negative for chest pain.  Gastrointestinal: Negative for abdominal pain.  Genitourinary: Negative for dysuria.  Musculoskeletal: Negative for back pain.  Skin: Negative for rash.  Neurological: Negative for headaches.  All other systems reviewed and are  negative.    Allergies  Review of patient's allergies indicates no known allergies.  Home Medications   Current Outpatient Rx  Name Route Sig Dispense Refill  . CALCITRIOL 0.25 MCG PO CAPS Oral Take 1 capsule (0.25 mcg total) by mouth daily. 30 capsule 5  . CALCIUM ACETATE 667 MG PO CAPS Oral Take 1 capsule (667 mg total) by mouth 3 (three) times daily with meals. 90 capsule 5  . CLONIDINE HCL 0.2 MG PO TABS Oral Take 1 tablet (0.2 mg total) by mouth 2 (two) times daily. 60 tablet 5  . HYDRALAZINE HCL 50 MG PO TABS Oral Take 50 mg by mouth every 8 (eight) hours.     Marland Kitchen RENA-VITE PO TABS Oral Take 1 tablet by mouth daily.      Marland Kitchen PEG 3350-KCL-NABCB-NACL-NASULF 236 G PO SOLR  Drink one quart today, if no improvement  then one quart tomorrow , continue until relief. 4000 mL 0    BP 139/86  Pulse 80  Temp(Src) 98.3 F (36.8 C) (Oral)  Resp 16  Ht 5\' 6"  (1.676 m)  Wt 160 lb (72.576 kg)  BMI 25.82 kg/m2  SpO2 98%  Physical Exam  Constitutional: He is oriented to person, place, and time. He appears well-developed and well-nourished.  HENT:  Head: Normocephalic and atraumatic.  Eyes: Conjunctivae and EOM are normal. Pupils are equal, round, and reactive to light.  Neck: Trachea normal. Neck supple. No thyromegaly present.  Cardiovascular: Normal rate, regular rhythm, S1 normal, S2 normal and normal  pulses.     No systolic murmur is present   No diastolic murmur is present  Pulses:      Radial pulses are 2+ on the right side, and 2+ on the left side.  Pulmonary/Chest: Effort normal and breath sounds normal. He has no wheezes. He has no rhonchi. He has no rales. He exhibits no tenderness.  Abdominal: Soft. Normal appearance and bowel sounds are normal. There is no tenderness. There is no CVA tenderness and negative Murphy's sign.  Musculoskeletal:       Left scapular region with area of tenderness and pointing with underlying fluctuance. No surrounding erythema or streaking  lymphangitis.    Neurological: He is alert and oriented to person, place, and time. He has normal strength. No cranial nerve deficit or sensory deficit. GCS eye subscore is 4. GCS verbal subscore is 5. GCS motor subscore is 6.  Skin: Skin is warm and dry. No rash noted. He is not diaphoretic.  Psychiatric: His speech is normal.       Cooperative and appropriate    ED Course  INCISION AND DRAINAGE Date/Time: 05/04/2011 4:05 AM Performed by: Sunnie Nielsen Authorized by: Sunnie Nielsen Consent: Verbal consent obtained. Risks and benefits: risks, benefits and alternatives were discussed Consent given by: patient Patient understanding: patient states understanding of the procedure being performed Patient consent: the patient's understanding of the procedure matches consent given Procedure consent: procedure consent matches procedure scheduled Required items: required blood products, implants, devices, and special equipment available Patient identity confirmed: verbally with patient Time out: Immediately prior to procedure a "time out" was called to verify the correct patient, procedure, equipment, support staff and site/side marked as required. Type: abscess Location: left scapular region. Anesthesia: local infiltration Local anesthetic: lidocaine 1% without epinephrine Anesthetic total: 2 ml Risk factor: underlying major vessel, underlying major nerve and coagulopathy Scalpel size: 11 Needle gauge: 22 Incision type: single with marsupialization Complexity: simple Drainage: purulent Drainage amount: moderate Wound treatment: wound left open Patient tolerance: Patient tolerated the procedure well with no immediate complications.   (including critical care time)     MDM   Abscess left scapular region treated with I&D as above, given relatively small wound decision made not to pack, wound left open with marsupialization.  Plan recheck 48 hours. Patient stable for discharge home. No  systemic symptoms.         Sunnie Nielsen, MD 05/04/11 [phone]

## 2011-05-07 ENCOUNTER — Inpatient Hospital Stay (HOSPITAL_COMMUNITY)
Admission: EM | Admit: 2011-05-07 | Discharge: 2011-05-09 | DRG: 314 | Disposition: A | Discharge status: Home / Self Care | Payer: Medicare Other | Attending: Family Medicine | Admitting: Family Medicine

## 2011-05-07 ENCOUNTER — Emergency Department (HOSPITAL_COMMUNITY): Payer: Medicare Other

## 2011-05-07 ENCOUNTER — Encounter (HOSPITAL_COMMUNITY): Payer: Self-pay | Admitting: *Deleted

## 2011-05-07 DIAGNOSIS — Y841 Kidney dialysis as the cause of abnormal reaction of the patient, or of later complication, without mention of misadventure at the time of the procedure: Secondary | ICD-10-CM | POA: Diagnosis present

## 2011-05-07 DIAGNOSIS — E785 Hyperlipidemia, unspecified: Secondary | ICD-10-CM | POA: Diagnosis present

## 2011-05-07 DIAGNOSIS — D649 Anemia, unspecified: Secondary | ICD-10-CM | POA: Diagnosis present

## 2011-05-07 DIAGNOSIS — N186 End stage renal disease: Secondary | ICD-10-CM | POA: Diagnosis present

## 2011-05-07 DIAGNOSIS — N19 Unspecified kidney failure: Secondary | ICD-10-CM

## 2011-05-07 DIAGNOSIS — R509 Fever, unspecified: Secondary | ICD-10-CM

## 2011-05-07 DIAGNOSIS — Z4901 Encounter for fitting and adjustment of extracorporeal dialysis catheter: Secondary | ICD-10-CM

## 2011-05-07 DIAGNOSIS — A419 Sepsis, unspecified organism: Secondary | ICD-10-CM | POA: Diagnosis present

## 2011-05-07 DIAGNOSIS — I1311 Hypertensive heart and chronic kidney disease without heart failure, with stage 5 chronic kidney disease, or end stage renal disease: Secondary | ICD-10-CM | POA: Diagnosis present

## 2011-05-07 DIAGNOSIS — T80218A Other infection due to central venous catheter, initial encounter: Principal | ICD-10-CM | POA: Diagnosis present

## 2011-05-07 LAB — URINALYSIS, ROUTINE W REFLEX MICROSCOPIC
Nitrite: NEGATIVE
Protein, ur: 100 mg/dL — AB
Specific Gravity, Urine: 1.01 (ref 1.005–1.030)
Urobilinogen, UA: 0.2 mg/dL (ref 0.0–1.0)

## 2011-05-07 LAB — CBC
Hemoglobin: 11.6 g/dL — ABNORMAL LOW (ref 13.0–17.0)
MCH: 29.6 pg (ref 26.0–34.0)
MCHC: 32 g/dL (ref 30.0–36.0)

## 2011-05-07 LAB — DIFFERENTIAL
Basophils Relative: 0 % (ref 0–1)
Eosinophils Absolute: 0 10*3/uL (ref 0.0–0.7)
Eosinophils Relative: 0 % (ref 0–5)
Monocytes Relative: 4 % (ref 3–12)
Neutrophils Relative %: 93 % — ABNORMAL HIGH (ref 43–77)

## 2011-05-07 LAB — URINE MICROSCOPIC-ADD ON

## 2011-05-07 LAB — COMPREHENSIVE METABOLIC PANEL
Albumin: 3.5 g/dL (ref 3.5–5.2)
BUN: 42 mg/dL — ABNORMAL HIGH (ref 6–23)
Calcium: 10.1 mg/dL (ref 8.4–10.5)
Creatinine, Ser: 10.68 mg/dL — ABNORMAL HIGH (ref 0.50–1.35)
Potassium: 5.6 mEq/L — ABNORMAL HIGH (ref 3.5–5.1)
Total Protein: 7.9 g/dL (ref 6.0–8.3)

## 2011-05-07 LAB — LACTIC ACID, PLASMA: Lactic Acid, Venous: 2.1 mmol/L (ref 0.5–2.2)

## 2011-05-07 LAB — MRSA PCR SCREENING: MRSA by PCR: NEGATIVE

## 2011-05-07 LAB — PROCALCITONIN: Procalcitonin: 73.92 ng/mL

## 2011-05-07 MED ORDER — SODIUM POLYSTYRENE SULFONATE 15 GM/60ML PO SUSP
30.0000 g | Freq: Once | ORAL | Status: AC
  Administered 2011-05-07: 30 g via ORAL
  Filled 2011-05-07: qty 120

## 2011-05-07 MED ORDER — PIPERACILLIN-TAZOBACTAM IN DEX 2-0.25 GM/50ML IV SOLN
2.2500 g | Freq: Three times a day (TID) | INTRAVENOUS | Status: DC
  Administered 2011-05-07 – 2011-05-09 (×5): 2.25 g via INTRAVENOUS
  Filled 2011-05-07 (×14): qty 50

## 2011-05-07 MED ORDER — CARVEDILOL 3.125 MG PO TABS
3.1250 mg | ORAL_TABLET | Freq: Two times a day (BID) | ORAL | Status: DC
  Administered 2011-05-07 – 2011-05-08 (×3): 3.125 mg via ORAL
  Filled 2011-05-07 (×3): qty 1

## 2011-05-07 MED ORDER — PIPERACILLIN-TAZOBACTAM IN DEX 2-0.25 GM/50ML IV SOLN
INTRAVENOUS | Status: AC
  Filled 2011-05-07: qty 100

## 2011-05-07 MED ORDER — VANCOMYCIN HCL IN DEXTROSE 1-5 GM/200ML-% IV SOLN
1000.0000 mg | INTRAVENOUS | Status: DC | PRN
  Filled 2011-05-07: qty 200

## 2011-05-07 MED ORDER — RENA-VITE PO TABS
1.0000 | ORAL_TABLET | Freq: Every day | ORAL | Status: DC
  Administered 2011-05-07 – 2011-05-08 (×2): 1 via ORAL
  Filled 2011-05-07 (×6): qty 1

## 2011-05-07 MED ORDER — HYDROMORPHONE HCL PF 1 MG/ML IJ SOLN
INTRAMUSCULAR | Status: AC
  Administered 2011-05-07: 16:00:00
  Filled 2011-05-07: qty 1

## 2011-05-07 MED ORDER — SEVELAMER CARBONATE 800 MG PO TABS: 1600.0000 mg | ORAL_TABLET | ORAL | Status: DC | PRN

## 2011-05-07 MED ORDER — CLONIDINE HCL 0.1 MG PO TABS
0.1000 mg | ORAL_TABLET | Freq: Two times a day (BID) | ORAL | Status: DC
  Administered 2011-05-07 – 2011-05-08 (×3): 0.1 mg via ORAL
  Filled 2011-05-07 (×3): qty 1

## 2011-05-07 MED ORDER — SODIUM CHLORIDE 0.9 % IV BOLUS (SEPSIS)
500.0000 mL | Freq: Once | INTRAVENOUS | Status: AC
  Administered 2011-05-07: 500 mL via INTRAVENOUS

## 2011-05-07 MED ORDER — LIDOCAINE-EPINEPHRINE (PF) 1 %-1:200000 IJ SOLN
INTRAMUSCULAR | Status: AC
  Administered 2011-05-07: 16:00:00
  Filled 2011-05-07: qty 10

## 2011-05-07 MED ORDER — SIMVASTATIN 20 MG PO TABS
20.0000 mg | ORAL_TABLET | Freq: Every day | ORAL | Status: DC
  Administered 2011-05-07 – 2011-05-08 (×2): 20 mg via ORAL
  Filled 2011-05-07 (×2): qty 1

## 2011-05-07 MED ORDER — SODIUM CHLORIDE 0.9 % IV SOLN: INTRAVENOUS | Status: DC

## 2011-05-07 MED ORDER — AMLODIPINE BESYLATE 5 MG PO TABS
10.0000 mg | ORAL_TABLET | Freq: Every day | ORAL | Status: DC
  Administered 2011-05-07 – 2011-05-08 (×2): 10 mg via ORAL
  Filled 2011-05-07 (×2): qty 2

## 2011-05-07 MED ORDER — SEVELAMER HCL 800 MG PO TABS: 1600.0000 mg | ORAL_TABLET | Freq: Three times a day (TID) | ORAL | Status: DC

## 2011-05-07 MED ORDER — SODIUM CHLORIDE 0.9 % IJ SOLN
INTRAMUSCULAR | Status: AC
  Filled 2011-05-07: qty 3

## 2011-05-07 MED ORDER — VANCOMYCIN HCL IN DEXTROSE 1-5 GM/200ML-% IV SOLN
1000.0000 mg | Freq: Once | INTRAVENOUS | Status: AC
  Administered 2011-05-07: 1000 mg via INTRAVENOUS
  Filled 2011-05-07: qty 200

## 2011-05-07 MED ORDER — ACETAMINOPHEN 325 MG PO TABS: 650.0000 mg | ORAL_TABLET | ORAL | Status: DC | PRN

## 2011-05-07 MED ORDER — HYDRALAZINE HCL 25 MG PO TABS
50.0000 mg | ORAL_TABLET | Freq: Three times a day (TID) | ORAL | Status: DC
  Administered 2011-05-07 – 2011-05-08 (×4): 50 mg via ORAL
  Filled 2011-05-07 (×4): qty 2

## 2011-05-07 MED ORDER — SEVELAMER CARBONATE 800 MG PO TABS
3200.0000 mg | ORAL_TABLET | Freq: Three times a day (TID) | ORAL | Status: DC
  Administered 2011-05-07 – 2011-05-09 (×5): 3200 mg via ORAL
  Filled 2011-05-07 (×5): qty 4

## 2011-05-07 MED ORDER — PIPERACILLIN-TAZOBACTAM 3.375 G IVPB
3.3750 g | Freq: Once | INTRAVENOUS | Status: AC
Start: 2011-05-07 — End: 2011-05-07
  Administered 2011-05-07: 3.375 g via INTRAVENOUS
  Filled 2011-05-07: qty 50

## 2011-05-07 MED ORDER — CLOPIDOGREL BISULFATE 75 MG PO TABS
75.0000 mg | ORAL_TABLET | Freq: Every day | ORAL | Status: DC
  Administered 2011-05-07 – 2011-05-08 (×2): 75 mg via ORAL
  Filled 2011-05-07 (×2): qty 1

## 2011-05-07 MED ORDER — ACETAMINOPHEN 325 MG PO TABS
650.0000 mg | ORAL_TABLET | Freq: Once | ORAL | Status: AC
  Administered 2011-05-07: 650 mg via ORAL
  Filled 2011-05-07: qty 2

## 2011-05-07 NOTE — ED Notes (Signed)
States he was told at dialysis he may have an infection in the catheter in his chest

## 2011-05-07 NOTE — Plan of Care (Signed)
Problem: Phase I Progression Outcomes Goal: OOB as tolerated unless otherwise ordered Outcome: Progressing Bathroom  Goal: Voiding-avoid urinary catheter unless indicated Outcome: Completed/Met Date Met:  05/07/11 Dialysis Tue Thurs Sat

## 2011-05-07 NOTE — ED Notes (Signed)
Ask pt if he could give [place] a urine and he said he would try

## 2011-05-07 NOTE — Progress Notes (Signed)
ANTIBIOTIC CONSULT NOTE - INITIAL  Pharmacy Consult for Vancomycin / Zosyn Indication:  Infection Catheter  No Known Allergies  Patient Measurements: Height: 5\' 6"  (167.6 cm) Weight: 165 lb 5.5 oz (75 kg) IBW/kg (Calculated) : 63.8    Vital Signs: Temp: 98.4 F (36.9 C) (04/11 1705) Temp src: Oral (04/11 1705) BP: 111/72 mmHg (04/11 1705) Pulse Rate: 103  (04/11 1705) Intake/Output from previous day:   Intake/Output from this shift:    Labs:  Basename 05/07/11 1329  WBC 15.5*  HGB 11.6*  PLT 189  LABCREA --  CREATININE 10.68*   Estimated Creatinine Clearance: 8.1 ml/min (by C-G formula based on Cr of 10.68). No results found for this basename: VANCOTROUGH:2,VANCOPEAK:2,VANCORANDOM:2,GENTTROUGH:2,GENTPEAK:2,GENTRANDOM:2,TOBRATROUGH:2,TOBRAPEAK:2,TOBRARND:2,AMIKACINPEAK:2,AMIKACINTROU:2,AMIKACIN:2, in the last 72 hours   Microbiology: No results found for this or any previous visit (from the past 720 hour(s)).  Medical History: Past Medical History  Diagnosis Date  . Hypertension   . Hypercholesteremia   . Chronic kidney disease   . ESRD (end stage renal disease) 11-2010    Medications:  Scheduled:    . acetaminophen  650 mg Oral Once  . HYDROmorphone      . lidocaine-EPINEPHrine      . piperacillin-tazobactam (ZOSYN)  IV  2.25 g Intravenous Q8H  . piperacillin-tazobactam (ZOSYN)  IV  3.375 g Intravenous Once  . sodium chloride  500 mL Intravenous Once  . sodium chloride      . vancomycin  1,000 mg Intravenous Once   Assessment: Dialysis Patient  Goal of Therapy:  Vancomycin trough level 15-20 mcg/ml  Plan:  Vancomycin 1 GM given in ER 05/07/11 3 PM. Next  dose due after next dialysis.  Zosyn 2.25 GM IV every 8 hours   Josephine Igo 05/07/2011,5:09 PM

## 2011-05-07 NOTE — ED Provider Notes (Signed)
History     CSN: [id number]  Arrival date & time 05/07/11  1100   First MD Initiated Contact with Patient 05/07/11 1306      Chief Complaint  Patient presents with  . Emesis    (Consider location/radiation/quality/duration/timing/severity/associated sxs/prior treatment) HPI Patient on dialysis t,th, Saturday at dialysis today cramp in neck, told fever, and nausea and vomiting.  Told he might have catheter infection. Given three tylenol at dialysis center.  Patient transported by pv to ed.  Patient chronic dialysis.  No current pain, no change in cough, diarrhea.  Patient vomited three times this a.m.  No breakfast. PMD is Dr. Renard Matter.   Past Medical History  Diagnosis Date  . Hypertension   . Hypercholesteremia   . Chronic kidney disease   . ESRD (end stage renal disease) 11-2010    Past Surgical History  Procedure Date  . Insertion of dialysis catheter 01/02/2011    Procedure: INSERTION OF DIALYSIS CATHETER;  Surgeon: Pryor Ochoa, MD;  Location: [facility] OR;  Service: Vascular;  Laterality: N/A;  Insertion of Dialysis catheter Right Internal Jugular with 24cm dialysis catheter    Family History  Problem Relation Age of Onset  . Diabetes Mother   . Hypertension Mother   . Hyperlipidemia Mother   . COPD Father     History  Substance Use Topics  . Smoking status: Never Smoker   . Smokeless tobacco: Not on file  . Alcohol Use: No      Review of Systems  All other systems reviewed and are negative.    Allergies  Review of patient's allergies indicates no known allergies.  Home Medications   Current Outpatient Rx  Name Route Sig Dispense Refill  . CALCITRIOL 0.25 MCG PO CAPS Oral Take 1 capsule (0.25 mcg total) by mouth daily. 30 capsule 5  . CALCIUM ACETATE 667 MG PO CAPS Oral Take 1 capsule (667 mg total) by mouth 3 (three) times daily with meals. 90 capsule 5  . CLONIDINE HCL 0.2 MG PO TABS Oral Take 1 tablet (0.2 mg total) by mouth 2 (two) times daily. 60  tablet 5  . HYDRALAZINE HCL 50 MG PO TABS Oral Take 50 mg by mouth every 8 (eight) hours.     Marland Kitchen RENA-VITE PO TABS Oral Take 1 tablet by mouth daily.      Marland Kitchen PEG 3350-KCL-NABCB-NACL-NASULF 236 G PO SOLR  Drink one quart today, if no improvement  then one quart tomorrow , continue until relief. 4000 mL 0    BP 151/85  Pulse 115  Temp(Src) 101.1 F (38.4 C) (Rectal)  Resp 21  Ht 5\' 6"  (1.676 m)  Wt 167 lb (75.751 kg)  BMI 26.95 kg/m2  SpO2 100%  Physical Exam  Nursing note and vitals reviewed. Constitutional: He is oriented to person, place, and time. He appears well-developed and well-nourished.  HENT:  Head: Normocephalic and atraumatic.  Right Ear: External ear normal.  Left Ear: External ear normal.  Nose: Nose normal.  Mouth/Throat: Oropharynx is clear and moist.  Eyes: Conjunctivae and EOM are normal. Pupils are equal, round, and reactive to light.  Neck: Normal range of motion. Neck supple.  Cardiovascular: Normal rate, regular rhythm, normal heart sounds and intact distal pulses.   Pulmonary/Chest: Effort normal and breath sounds normal.       Right dialysis catheter, no erythema at site.    Abdominal: Soft. Bowel sounds are normal.  Musculoskeletal: Normal range of motion.       Left  forearm with fistula, no redness or swelling  Neurological: He is alert and oriented to person, place, and time. He has normal reflexes.  Skin: Skin is warm and dry.  Psychiatric: He has a normal mood and affect. His behavior is normal. Thought content normal.    ED Course  Procedures (including critical care time)  Labs Reviewed - No data to display No results found.   No diagnosis found.    Date: 05/07/2011  Rate: 109  Rhythm: sinus tachycardia  QRS Axis: normal  Intervals: normal     ST/T Wave abnormalities: nonspecific ST/T changes and t wave inversion v6  Conduction Disutrbances:lvh with repol abnormalities  Old EKG Reviewed: unchanged  Results for orders placed during  the hospital encounter of 05/07/11  CBC      Component Value Range   WBC 15.5 (*) 4.0 - 10.5 (K/uL)   RBC 3.92 (*) 4.22 - 5.81 (MIL/uL)   Hemoglobin 11.6 (*) 13.0 - 17.0 (g/dL)   HCT 19.1 (*) 47.8 - 52.0 (%)   MCV 92.6  78.0 - 100.0 (fL)   MCH 29.6  26.0 - 34.0 (pg)   MCHC 32.0  30.0 - 36.0 (g/dL)   RDW 29.5 (*) 62.1 - 15.5 (%)   Platelets 189  150 - 400 (K/uL)  DIFFERENTIAL      Component Value Range   Neutrophils Relative 93 (*) 43 - 77 (%)   Neutro Abs 14.4 (*) 1.7 - 7.7 (K/uL)   Lymphocytes Relative 3 (*) 12 - 46 (%)   Lymphs Abs 0.5 (*) 0.7 - 4.0 (K/uL)   Monocytes Relative 4  3 - 12 (%)   Monocytes Absolute 0.6  0.1 - 1.0 (K/uL)   Eosinophils Relative 0  0 - 5 (%)   Eosinophils Absolute 0.0  0.0 - 0.7 (K/uL)   Basophils Relative 0  0 - 1 (%)   Basophils Absolute 0.0  0.0 - 0.1 (K/uL)  COMPREHENSIVE METABOLIC PANEL      Component Value Range   Sodium 137  135 - 145 (mEq/L)   Potassium 5.6 (*) 3.5 - 5.1 (mEq/L)   Chloride 96  96 - 112 (mEq/L)   CO2 27  19 - 32 (mEq/L)   Glucose, Bld 92  70 - 99 (mg/dL)   BUN 42 (*) 6 - 23 (mg/dL)   Creatinine, Ser 30.86 (*) 0.50 - 1.35 (mg/dL)   Calcium 57.8  8.4 - 10.5 (mg/dL)   Total Protein 7.9  6.0 - 8.3 (g/dL)   Albumin 3.5  3.5 - 5.2 (g/dL)   AST 40 (*) 0 - 37 (U/L)   ALT 30  0 - 53 (U/L)   Alkaline Phosphatase 63  39 - 117 (U/L)   Total Bilirubin 0.5  0.3 - 1.2 (mg/dL)   GFR calc non Af Amer 5 (*) >90 (mL/min)   GFR calc Af Amer 6 (*) >90 (mL/min)  LACTIC ACID, PLASMA      Component Value Range   Lactic Acid, Venous 2.1  0.5 - 2.2 (mmol/L)  PROCALCITONIN      Component Value Range   Procalcitonin 73.92     Dg Chest Port 1 View  05/07/2011  *RADIOLOGY REPORT*  Clinical Data: Emesis, cough and fever  PORTABLE CHEST - 1 VIEW  Comparison: The chest radiograph 01/01/2011  Findings: There is a large bore right central venous line with split catheter tips in the distal SVC.  Stable cardiac silhouette. No effusion, infiltrate, or  pneumothorax.  IMPRESSION: No acute cardiopulmonary process.  Dialysis catheter appears in good position.  Original Report Authenticated By: Genevive Bi, M.D.   MDM  Consulted with general surgery regarding dialysis catheter removal.  Patient with dialysis and fever with unclear source on initial evaluation but catheter sepsis is high in differential.  Zosyn and vanc ordered.  2:23 PM General surgery advised catheter to be pulled with tip cultured.  Area prepped and draped, anestetized with 3 cc lido with 1% epi.  Catheter removed and tip sent for culture.  Minimal bleeding.  3:29 PM   Patient's care discussed with Dr. Renard Matter. Dr. Renard Matter was seen in bed for possible early sepsis.  Patient is on dialysis with catheter previously in place and fever without source. Blood pressure has been elevated here. Heart rate is elevated at 107. Blood and catheter tip sent for culture. Patient given Zosyn and date IV here in the emergency department.      Hilario Quarry, MD 05/07/11 [phone]

## 2011-05-07 NOTE — H&P (Signed)
NAMECRAIGORY, TOSTE                  ACCOUNT NO.:  [id number]  MEDICAL RECORD NO.:  [id number]  LOCATION:  A320                          FACILITY:  [facility]  PHYSICIAN:  Conard Alvira G. Renard Matter, MD   DATE OF BIRTH:  Jun 25, 1970  DATE OF ADMISSION:  05/07/2011 DATE OF DISCHARGE:  LH                             HISTORY & PHYSICAL   ADDENDUM  MEDICATIONS LIST: 1. Amlodipine 10 mg daily. 2. Carvedilol 3.125 mg b.i.d. 3. Clonidine 0.1 mg b.i.d. 4. Clopidogrel 75 mg daily. 5. Hydralazine 50 mg b.i.d. 6. Multivitamin 1 daily. 7. Simvastatin 20 mg daily. 8. Sevelamer 1600-3200 mg t.i.d.     Labaron Digirolamo G. Renard Matter, MD     AGM/MEDQ  D:  05/07/2011  T:  05/07/2011  Job:  [id number]

## 2011-05-07 NOTE — ED Notes (Signed)
Vomiting onset this am, states he had dialysis this am, given tylenol at dialysis

## 2011-05-07 NOTE — Consult Note (Signed)
Reason for Consult: End-stage renal disease Referring Physician: Dr. Joellyn Quails is an 41 y.o. male.  HPI: He is a patient was history of hypertension end-stage renal disease on maintenance hemodialysis presently and came to the dialysis unit with the complaints of fever chills since this morning. And also he was complaining of  right-sided neck pain where he has a catheter . The exist site was clean no drainage however still with some tenderness on the top of his hemodialysis  catheter on the right neck area. Patient denies any cough.  Past Medical History  Diagnosis Date  . Hypertension   . Hypercholesteremia   . Chronic kidney disease   . ESRD (end stage renal disease) 11-2010    Past Surgical History  Procedure Date  . Insertion of dialysis catheter 01/02/2011    Procedure: INSERTION OF DIALYSIS CATHETER;  Surgeon: Pryor Ochoa, MD;  Location: [facility] OR;  Service: Vascular;  Laterality: N/A;  Insertion of Dialysis catheter Right Internal Jugular with 24cm dialysis catheter    Family History  Problem Relation Age of Onset  . Diabetes Mother   . Hypertension Mother   . Hyperlipidemia Mother   . COPD Father     Social History:  reports that he has never smoked. He does not have any smokeless tobacco history on file. He reports that he does not drink alcohol or use illicit drugs.  Allergies: No Known Allergies  Medications: I have reviewed the patient's current medications.  Results for orders placed during the hospital encounter of 05/07/11 (from the past 48 hour(s))  CBC     Status: Abnormal   Collection Time   05/07/11  1:29 PM      Component Value Range Comment   WBC 15.5 (*) 4.0 - 10.5 (K/uL)    RBC 3.92 (*) 4.22 - 5.81 (MIL/uL)    Hemoglobin 11.6 (*) 13.0 - 17.0 (g/dL)    HCT 16.1 (*) 09.6 - 52.0 (%)    MCV 92.6  78.0 - 100.0 (fL)    MCH 29.6  26.0 - 34.0 (pg)    MCHC 32.0  30.0 - 36.0 (g/dL)    RDW 04.5 (*) 40.9 - 15.5 (%)    Platelets 189  150 - 400  (K/uL)   DIFFERENTIAL     Status: Abnormal   Collection Time   05/07/11  1:29 PM      Component Value Range Comment   Neutrophils Relative 93 (*) 43 - 77 (%)    Neutro Abs 14.4 (*) 1.7 - 7.7 (K/uL)    Lymphocytes Relative 3 (*) 12 - 46 (%)    Lymphs Abs 0.5 (*) 0.7 - 4.0 (K/uL)    Monocytes Relative 4  3 - 12 (%)    Monocytes Absolute 0.6  0.1 - 1.0 (K/uL)    Eosinophils Relative 0  0 - 5 (%)    Eosinophils Absolute 0.0  0.0 - 0.7 (K/uL)    Basophils Relative 0  0 - 1 (%)    Basophils Absolute 0.0  0.0 - 0.1 (K/uL)   COMPREHENSIVE METABOLIC PANEL     Status: Abnormal   Collection Time   05/07/11  1:29 PM      Component Value Range Comment   Sodium 137  135 - 145 (mEq/L)    Potassium 5.6 (*) 3.5 - 5.1 (mEq/L)    Chloride 96  96 - 112 (mEq/L)    CO2 27  19 - 32 (mEq/L)    Glucose, Bld  92  70 - 99 (mg/dL)    BUN 42 (*) 6 - 23 (mg/dL)    Creatinine, Ser 16.10 (*) 0.50 - 1.35 (mg/dL)    Calcium 96.0  8.4 - 10.5 (mg/dL)    Total Protein 7.9  6.0 - 8.3 (g/dL)    Albumin 3.5  3.5 - 5.2 (g/dL)    AST 40 (*) 0 - 37 (U/L)    ALT 30  0 - 53 (U/L)    Alkaline Phosphatase 63  39 - 117 (U/L)    Total Bilirubin 0.5  0.3 - 1.2 (mg/dL)    GFR calc non Af Amer 5 (*) >90 (mL/min)    GFR calc Af Amer 6 (*) >90 (mL/min)   LACTIC ACID, PLASMA     Status: Normal   Collection Time   05/07/11  1:30 PM      Component Value Range Comment   Lactic Acid, Venous 2.1  0.5 - 2.2 (mmol/L)   PROCALCITONIN     Status: Normal   Collection Time   05/07/11  1:30 PM      Component Value Range Comment   Procalcitonin 73.92       Dg Chest Port 1 View  05/07/2011  *RADIOLOGY REPORT*  Clinical Data: Emesis, cough and fever  PORTABLE CHEST - 1 VIEW  Comparison: The chest radiograph 01/01/2011  Findings: There is a large bore right central venous line with split catheter tips in the distal SVC.  Stable cardiac silhouette. No effusion, infiltrate, or pneumothorax.  IMPRESSION: No acute cardiopulmonary process.   Dialysis catheter appears in good position.  Original Report Authenticated By: Genevive Bi, M.D.    Review of Systems  Constitutional: Positive for fever and chills.  HENT: Positive for neck pain.   Respiratory: Negative for cough, hemoptysis and shortness of breath.   Gastrointestinal: Negative for nausea and vomiting.  Musculoskeletal: Negative for back pain.       On the right side/neck. He had the also some erythema. There is no swelling. This is the area on the top of his  dietk catheter.   Blood pressure 111/72, pulse 103, temperature 98.4 F (36.9 C), temperature source Oral, resp. rate 20, height 5\' 6"  (1.676 m), weight 75 kg (165 lb 5.5 oz), SpO2 94.00%. Physical Exam  Eyes: No scleral icterus.  Neck: No JVD present.  Cardiovascular: Normal rate and regular rhythm.   Murmur heard. Respiratory: No respiratory distress.  GI: He exhibits no distension. There is no tenderness.  Musculoskeletal: He exhibits no edema.  Skin: Skin is dry.    Assessment/Plan: Problem #1 fever and tenderness of her his right neck over his catheter in possibly secondary to infection. Was done in dialysis unit and was given 1 g of cefazolin and 1 g of Ancef after dialysis. After that patient came to the emergency room where he received a dose of vancomycin and status post removed. Presently patient seems to be feeling better. Problem #2 end-stage renal disease is status post hemodialysis today he does have any uremic sinus symptoms no sign of fluid overload Problem #3 history of hypertension his blood pressure seems to be controlled very well Problem #4 history of hypercholesterolemia Problem #5 history of  left ventricular hypertrophy Problem #6 history of anemia Plan: Since patient received hemodialysis today and he doesn't have any sign of fluid overload his next dialysis will be at his regular schedule on Saturday. We'll check a CBC, basic metabolic panel and phosphorus in the morning We'll  follow his  blood culture and possibly adjust his antibiotics. We'll continue with other medication as before.  Alexandra Posadas S 05/07/2011, 6:01 PM

## 2011-05-07 NOTE — H&P (Signed)
Mario Alexander, Mario Alexander                  ACCOUNT NO.:  [id number]  MEDICAL RECORD NO.:  [id number]  LOCATION:  A320                          FACILITY:  [facility]  PHYSICIAN:  Liel Rudden G. Renard Matter, MD   DATE OF BIRTH:  1970-03-07  DATE OF ADMISSION:  05/07/2011 DATE OF DISCHARGE:  LH                             HISTORY & PHYSICAL   This 41 year old male has chronic renal failure and hypertension, had been on dialysis 3 times a week, Tuesday, Thursday, and Saturday, had dialysis today, developed fever, was seen in the emergency room following this, apparently had nausea and vomiting as well.  He was told he might have a catheter infection.  The catheter was removed in the ED from right chest and cultured.  Blood cultures were obtained.  The patient was given vancomycin and Zosyn and was felt he should be admitted because of possible impending sepsis from infection.  SOCIAL HISTORY:  The patient does not smoke or drink alcohol.  PAST FAMILY HISTORY:  Mother has diabetes, hypertension, hyperlipidemia, COPD in father.  PAST MEDICAL HISTORY:  The patient has history of hypertension, hyperlipidemia, chronic kidney disease, end-stage renal disease.  SURGICAL HISTORY:  The patient has had insertion of dialysis catheter on right and left forearm fistula.  REVIEW OF SYSTEMS:  HEENT:  Negative.  CARDIOPULMONARY:  No cough, hemoptysis, or dyspnea.  GI:  Episodes of vomiting.  GU:  The patient has diminished urinary output.  No dysuria.  ALLERGIES:  No known allergies.  HOME MEDICATIONS: 1. Calcitriol 0.25 mg 1 daily. 2. Calcium acetate 667 mg 1 capsule 3 times a day with meals. 3. Clonidine 0.2 mg b.i.d. 4. Hydralazine 50 mg every 8 hours. 5. Rena-Vite 1 tablet daily. 6. PEG 3350/KCl/NaBcb/NaCl/NaSulf 236 g p.o. daily.  PHYSICAL EXAMINATION:  GENERAL:  Alert male. VITAL SIGNS:  Blood pressure 151/85, pulse 115, temperature 101.1. HEENT:  Eyes PERRLA. NECK:  Supple.  No JVD or thyroid  abnormalities. HEART:  Regular rhythm.  No murmurs. LUNGS:  Clear to P and A.  The patient has right dialysis catheter at the site, upper chest.  No surrounding erythema or tenderness.  Left forearm fistula, no redness or tenderness. ABDOMEN:  No palpable organs or masses.  No organomegaly.  EXTREMITIES: Free of edema. NEUROLOGIC:  No focal deficits.  Cranial nerves intact.  Normal reflexes.  No sensory or motor abnormalities.  ASSESSMENT:  The patient with his in chronic renal failure, on dialysis. He was admitted with fever, elevated creatinine, has felt to have a possible early sepsis from right dialysis catheter.  PLAN:  Monitor the patient's blood pressure, vital signs, continue intravenous vancomycin and Zosyn.  We will monitor his chemistries and hopefully we will have blood culture results back soon.     Mario Alexander G. Renard Matter, MD     AGM/MEDQ  D:  05/07/2011  T:  05/07/2011  Job:  [id number]

## 2011-05-08 ENCOUNTER — Inpatient Hospital Stay (HOSPITAL_COMMUNITY): Payer: Medicare Other

## 2011-05-08 LAB — URINE CULTURE

## 2011-05-08 LAB — CBC
HCT: 37 % — ABNORMAL LOW (ref 39.0–52.0)
Hemoglobin: 11.8 g/dL — ABNORMAL LOW (ref 13.0–17.0)
MCV: 94.9 fL (ref 78.0–100.0)
RBC: 3.9 MIL/uL — ABNORMAL LOW (ref 4.22–5.81)
WBC: 9.6 10*3/uL (ref 4.0–10.5)

## 2011-05-08 LAB — BASIC METABOLIC PANEL
CO2: 27 mEq/L (ref 19–32)
Chloride: 94 mEq/L — ABNORMAL LOW (ref 96–112)
Creatinine, Ser: 12.33 mg/dL — ABNORMAL HIGH (ref 0.50–1.35)
GFR calc Af Amer: 5 mL/min — ABNORMAL LOW (ref >90)
Potassium: 4.7 mEq/L (ref 3.5–5.1)

## 2011-05-08 MED ORDER — VANCOMYCIN HCL IN DEXTROSE 1-5 GM/200ML-% IV SOLN
1000.0000 mg | INTRAVENOUS | Status: DC
  Administered 2011-05-08: 1000 mg via INTRAVENOUS
  Filled 2011-05-08 (×2): qty 200

## 2011-05-08 MED ORDER — SODIUM CHLORIDE 0.9 % IJ SOLN
INTRAMUSCULAR | Status: AC
  Administered 2011-05-08: 3 mL
  Filled 2011-05-08: qty 3

## 2011-05-08 MED ORDER — HEPARIN SODIUM (PORCINE) 1000 UNIT/ML DIALYSIS
20.0000 [IU]/kg | INTRAMUSCULAR | Status: DC | PRN
  Filled 2011-05-08: qty 2

## 2011-05-08 MED ORDER — PIPERACILLIN SOD-TAZOBACTAM SO 2.25 (2-0.25) G IV SOLR: 1.1500 g | INTRAVENOUS | Status: DC

## 2011-05-08 MED ORDER — HEPARIN SODIUM (PORCINE) 1000 UNIT/ML DIALYSIS
300.0000 [IU] | INTRAMUSCULAR | Status: DC | PRN
  Filled 2011-05-08: qty 1

## 2011-05-08 NOTE — Progress Notes (Signed)
Mario Alexander, Mario Alexander                  ACCOUNT NO.:  [id number]  MEDICAL RECORD NO.:  [id number]  LOCATION:  A320                          FACILITY:  [facility]  PHYSICIAN:  Eura Mccauslin G. Renard Matter, MD   DATE OF BIRTH:  [birth year]  DATE OF PROCEDURE: DATE OF DISCHARGE:                                PROGRESS NOTE   This patient was brought into the hospital with acute fever.  He is in chronic renal failure and has hypertension.  He is receiving dialysis 3 times a week.  He had developed a fever, was seen in emergency room with nausea and vomiting.  Catheter worked was removed from the right chest. He remains afebrile.  PERTINENT LABORATORY STUDIES:  Hemoglobin 11.6, hematocrit 36.3, BUN 42, creatinine 10.68.  PHYSICAL EXAMINATION:  HEART:  Regular rhythm. LUNGS:  Clear to P and A. ABDOMEN:  No palpable organs or masses. EXTREMITIES:  The patient has fistula in left arm.  ASSESSMENT:  The patient has chronic renal failure, on dialysis.  He did develop fever, nausea, and vomiting following dialysis and may be becoming septic.  PLAN:  Cultures obtained.  The patient is on vancomycin intravenously and Zosyn.  Also, had been seen by Nephrology.  We will follow his blood culture and possibly had to adjust his antibiotics.     Arnecia Ector G. Renard Matter, MD     AGM/MEDQ  D:  05/08/2011  T:  05/08/2011  Job:  [id number]

## 2011-05-08 NOTE — Progress Notes (Signed)
ANTIBIOTIC CONSULT NOTE  Pharmacy Consult for Vancomycin / Zosyn Indication:  Infection Catheter  No Known Allergies  Patient Measurements: Height: 5\' 6"  (167.6 cm) Weight: 165 lb 5.5 oz (75 kg) IBW/kg (Calculated) : 63.8    Vital Signs: Temp: 98.1 F (36.7 C) (04/12 0528) Temp src: Oral (04/12 0528) BP: 130/75 mmHg (04/12 0528) Pulse Rate: 88  (04/12 0528) Intake/Output from previous day: 04/11 0701 - 04/12 0700 In: 320 [P.O.:220; IV Piggyback:100] Out: -  Intake/Output from this shift:    Labs:  Basename 05/08/11 0645 05/07/11 1329  WBC 9.6 15.5*  HGB 11.8* 11.6*  PLT 125* 189  LABCREA -- --  CREATININE 12.33* 10.68*   Estimated Creatinine Clearance: 7 ml/min (by C-G formula based on Cr of 12.33). No results found for this basename: VANCOTROUGH:2,VANCOPEAK:2,VANCORANDOM:2,GENTTROUGH:2,GENTPEAK:2,GENTRANDOM:2,TOBRATROUGH:2,TOBRAPEAK:2,TOBRARND:2,AMIKACINPEAK:2,AMIKACINTROU:2,AMIKACIN:2, in the last 72 hours   Microbiology: Recent Results (from the past 720 hour(s))  CULTURE, BLOOD (ROUTINE X 2)     Status: Normal (Preliminary result)   Collection Time   05/07/11 12:30 PM      Component Value Range Status Comment   Specimen Description BLOOD RIGHT ANTECUBITAL   Final    Special Requests BOTTLES DRAWN AEROBIC ONLY 4CC   Final    Culture NO GROWTH 1 DAY   Final    Report Status PENDING   Incomplete   CULTURE, BLOOD (ROUTINE X 2)     Status: Normal (Preliminary result)   Collection Time   05/07/11  1:29 PM      Component Value Range Status Comment   Specimen Description BLOOD RIGHT FOREARM DRAWN BY RN   Final    Special Requests BOTTLES DRAWN AEROBIC AND ANAEROBIC 4CC   Final    Culture NO GROWTH 1 DAY   Final    Report Status PENDING   Incomplete   CATH TIP CULTURE     Status: Normal (Preliminary result)   Collection Time   05/07/11  3:29 PM      Component Value Range Status Comment   Specimen Description CATH TIP RIGHT SUBCLAVIAN   Final    Special Requests  NONE   Final    Culture NO GROWTH 1 DAY   Final    Report Status PENDING   Incomplete   MRSA PCR SCREENING     Status: Normal   Collection Time   05/07/11  8:53 PM      Component Value Range Status Comment   MRSA by PCR NEGATIVE  NEGATIVE  Final     Medical History: Past Medical History  Diagnosis Date  . Hypertension   . Hypercholesteremia   . Chronic kidney disease   . ESRD (end stage renal disease) 11-2010    Medications:  Scheduled:     . acetaminophen  650 mg Oral Once  . amLODipine  10 mg Oral Daily  . carvedilol  3.125 mg Oral BID  . cloNIDine  0.1 mg Oral BID  . clopidogrel  75 mg Oral Daily  . hydrALAZINE  50 mg Oral TID  . HYDROmorphone      . lidocaine-EPINEPHrine      . multivitamin  1 tablet Oral Daily  . piperacillin-tazobactam (ZOSYN)  IV  2.25 g Intravenous Q8H  . piperacillin-tazobactam (ZOSYN)  IV  3.375 g Intravenous Once  . sevelamer  3,200 mg Oral TID WC  . simvastatin  20 mg Oral QHS  . sodium chloride  500 mL Intravenous Once  . sodium polystyrene  30 g Oral Once  . vancomycin  1,000 mg Intravenous Once  . vancomycin  1,000 mg Intravenous Q M,W,F-HD  . DISCONTD: piperacillin-tazobactam (ZOSYN)  IV  1.15 g Intravenous Q T,Th,Sa-HD  . DISCONTD: sevelamer  1,600-3,200 mg Oral TID WC   Assessment: Dialysis Patient Micro pending  Goal of Therapy:  Pre-HD Vancomycin Level = 15-20  Plan:  Vancomycin 1000mg  IV every HD (MWF) Zosyn 2.25 GM IV every 8 hours  Pre-HD level next week if continues.  Mady Gemma 05/08/2011,8:46 AM

## 2011-05-08 NOTE — Progress Notes (Signed)
UR Chart Review Completed  

## 2011-05-08 NOTE — Progress Notes (Signed)
   CARE MANAGEMENT NOTE 05/08/2011  Patient:  Mario Alexander, Mario Alexander   Account Number:  [id number]  Date Initiated:  05/08/2011  Documentation initiated by:  Sharrie Rothman  Subjective/Objective Assessment:   Pt admitted from home with fever and leukocytosis. Pt lives with wife and receives dialysis from [facility] in [place]. Permacath removed upon admission due to ?infected dialysis cath.     Action/Plan:   No CM needs noted at this time. If pt requires outpt IV AB, may be given during dialysis treatment. Will follow for any CM needs.   Anticipated DC Date:  05/14/2011   Anticipated DC Plan:  HOME/SELF CARE      DC Planning Services  CM consult      Choice offered to / List presented to:             Status of service:  In process, will continue to follow Medicare Important Message given?   (If response is "NO", the following Medicare IM given date fields will be blank) Date Medicare IM given:   Date Additional Medicare IM given:    Discharge Disposition:  HOME/SELF CARE  Per UR Regulation:    If discussed at Long Length of Stay Meetings, dates discussed:    Comments:  05/08/11 1510 Arlyss Queen, RN BSN CM

## 2011-05-08 NOTE — Progress Notes (Signed)
Subjective: Interval History: has no complaint of fever or chills.. Patient denies also any neck pain. At this moment overall he said he's doing better than yesterday. He does not have any difficulty in breathing. Appetite is good.  Objective: Vital signs in last 24 hours: Temp:  [98 F (36.7 C)-101.5 F (38.6 C)] 98.1 F (36.7 C) (04/12 0528) Pulse Rate:  [79-115] 88  (04/12 0528) Resp:  [15-21] 18  (04/12 0528) BP: (103-152)/(63-107) 130/75 mmHg (04/12 0528) SpO2:  [92 %-100 %] 92 % (04/12 0528) Weight:  [75 kg (165 lb 5.5 oz)-75.751 kg (167 lb)] 75 kg (165 lb 5.5 oz) (04/11 1705) Weight change:   Intake/Output from previous day: 04/11 0701 - 04/12 0700 In: 320 [P.O.:220; IV Piggyback:100] Out: -  Intake/Output this shift:    General appearance: alert, cooperative and no distress Breasts: normal appearance, no masses or tenderness Cardio: regular rate and rhythm, S1, S2 normal, no murmur, click, rub or gallop GI: soft, non-tender; bowel sounds normal; no masses,  no organomegaly Extremities: extremities normal, atraumatic, no cyanosis or edema  Lab Results:  Basename 05/08/11 0645 05/07/11 1329  WBC 9.6 15.5*  HGB 11.8* 11.6*  HCT 37.0* 36.3*  PLT 125* 189   BMET:  Basename 05/08/11 0645 05/07/11 1329  NA 135 137  K 4.7 5.6*  CL 94* 96  CO2 27 27  GLUCOSE 103* 92  BUN 53* 42*  CREATININE 12.33* 10.68*  CALCIUM 9.1 10.1   No results found for this basename: PTH:2 in the last 72 hours Iron Studies: No results found for this basename: IRON,TIBC,TRANSFERRIN,FERRITIN in the last 72 hours  Studies/Results: Dg Chest Port 1 View  05/07/2011  *RADIOLOGY REPORT*  Clinical Data: Emesis, cough and fever  PORTABLE CHEST - 1 VIEW  Comparison: The chest radiograph 01/01/2011  Findings: There is a large bore right central venous line with split catheter tips in the distal SVC.  Stable cardiac silhouette. No effusion, infiltrate, or pneumothorax.  IMPRESSION: No acute  cardiopulmonary process.  Dialysis catheter appears in good position.  Original Report Authenticated By: Genevive Bi, M.D.    I have reviewed the patient's current medications.  Assessment/Plan: Problem #1 fever with leukocytosis possibly line infection patient cath has been removed and presently his on antibiotics. Today patient is a febrile and his white blood cell count is normal. Blood culture as this moment doesn't show any gross. Problem #2 anemia his hemoglobin is 11.8 hematocrit 37 stable he's on Epogen. Problem #3 history of hyperkalemia potassium is 4.7 stable  Problem #4 history of hypertension his blood pressure is controlled very well Problem #5 history of severe LVH possibly related to his hypertension. Plan: We'll make arrangements for patient to get dialysis tomorrow           We'll follow his blood culture and we'll check also his CBC, basic metabolic panel and phosphorus.           We'll give him an additional dose of Zosyn 1.15 mg after hemodialysis.    LOS: 1 day   Jaicee Michelotti S 05/08/2011,8:16 AM

## 2011-05-09 LAB — CBC
HCT: 33.1 % — ABNORMAL LOW (ref 39.0–52.0)
Hemoglobin: 10.6 g/dL — ABNORMAL LOW (ref 13.0–17.0)
MCH: 29.9 pg (ref 26.0–34.0)
MCHC: 32 g/dL (ref 30.0–36.0)
MCV: 93.5 fL (ref 78.0–100.0)
Platelets: 174 K/uL (ref 150–400)
RBC: 3.54 MIL/uL — ABNORMAL LOW (ref 4.22–5.81)
RDW: 16.3 % — ABNORMAL HIGH (ref 11.5–15.5)
WBC: 8.4 K/uL (ref 4.0–10.5)

## 2011-05-09 LAB — PHOSPHORUS: Phosphorus: 4.5 mg/dL (ref 2.3–4.6)

## 2011-05-09 NOTE — Plan of Care (Signed)
Problem: Phase III Progression Outcomes Goal: Pain controlled on oral analgesia Outcome: Not Applicable Date Met:  05/09/11 Pt denies pain.

## 2011-05-09 NOTE — Progress Notes (Signed)
States understanding of discharge,d/ced from floor accompanied by staff.

## 2011-05-09 NOTE — Discharge Summary (Signed)
Mario Alexander, Mario Alexander                  ACCOUNT NO.:  [id number]  MEDICAL RECORD NO.:  [id number]  LOCATION:  A320                          FACILITY:  [facility]  PHYSICIAN:  Mahli Glahn G. Renard Matter, MD   DATE OF BIRTH:  [birth year]  DATE OF ADMISSION:  05/07/2011 DATE OF DISCHARGE:  LH                              DISCHARGE SUMMARY   DIAGNOSES:  End-stage renal disease requiring dialysis, leukocytosis and fever, possible early sepsis, hypertension, left ventricular hypertrophy.  CONDITION:  Stable and improved at the time of his discharge.  This 41 year old male who has chronic renal failure and hypertension. He has been on dialysis 3 times a week on Tuesday, Thursday, and Saturday.  He had dialysis on the day of admission, developed fever, was seen in the emergency room following this.  Apparently had nausea and vomiting as well and was told he might have a catheter infection.  The catheter was removed from the chest in ED and cultured.  Blood cultures were obtained.  The patient was given vancomycin and Zosyn.  It was felt he should be admitted because of possible impending sepsis from infection.  PHYSICAL EXAMINATION:  GENERAL:  Alert male. VITAL SIGNS:  Blood pressure 151/85, pulse 115, temp 101.1. HEENT:  Eyes, PERRLA.  TM negative.  Oropharynx benign. NECK:  Supple.  No JVD or thyroid abnormalities. HEART:  Regular rhythm.  No murmurs. LUNGS:  Clear to P and A. CHEST:  The patient has right dialysis catheter in upper chest.  No surrounding erythema or tenderness.  Left forearm fistula.  No redness or tenderness. ABDOMEN:  No palpable organs or masses.  No organomegaly. EXTREMITIES:  Free of edema. NEUROLOGICAL:  No focal deficits.  Cranial nerves intact.  Normal reflexes.  No sensory or motor abnormalities.  LABORATORY DATA ON ADMISSION:  CBC:  WBC 8400 with hemoglobin 10.6, hematocrit 33.1, 93 neutrophils, 3 lymphocytes.  Lactic acid 2.1. Procalcitonin 73.92.  Chemistry:  Sodium 135,  potassium 4.7, chloride 94, CO2 of 27, BUN 53, creatinine 12.33, calcium 9.1, glucose 103. Calcium, total PTH 8.9, phosphorus 4.5, alkaline phosphatase 63, albumin 3.5, AST 40, ALT 30.  X-RAYS:  Chest x-ray, no acute cardiopulmonary process.  Dialysis catheter appears in good position.  HOSPITAL COURSE:  The patient at the time of his admission was placed on a low-sodium renal diet.  IV fluid was started with normal saline.  He was continued on the following medications:  Amlodipine 10 mg daily, carvedilol 3.125 mg b.i.d., clonidine 0.1 mg b.i.d., clopidogrel 75 mg daily, hydralazine 50 mg t.i.d., multivitamin 1 daily.  He was placed on IV Zosyn 2.25 mg q.8 hours and vancomycin 1000 mg IV which should be continued on Monday, Wednesday, and Friday, also simvastatin 20 mg daily, Renvela 3200 mg t.i.d.  The patients exam on admission, blood pressure 151/85, pulse 115, temp 101.1.  His vital signs were monitored and his blood pressure on day of discharge 118/66, respirations 18, pulse 85, temp 98.2.  The patient was seen by Nephrology and was dialyzed on Friday, May 08, 2011.  Blood cultures showed no growth. The patient insisted on going home and insisted on getting back on outpatient dialysis  which was felt to be appropriate.  Therefore, he was discharged on May 09, 2011.  He is discharged on the following medication regimen: 1. Norvasc 10 mg daily. 2. Carvedilol 3.125 mg b.i.d. 3. Catapres 0.1 mg b.i.d. 4. Plavix 75 mg daily. 5. Apresoline 50 mg 3 times a day. 6. Simvastatin 20 mg daily. 7. Multivitamin. 8. Rena-Vite 1 tab daily. 9. Renagel 800 mg tablet 1600 mg t.i.d.     Dawnya Grams G. Renard Matter, MD     AGM/MEDQ  D:  05/09/2011  T:  05/09/2011  Job:  [id number]

## 2011-05-11 LAB — CATH TIP CULTURE: Culture: >100

## 2011-05-12 LAB — CULTURE, BLOOD (ROUTINE X 2)
Culture: NO GROWTH
Culture: NO GROWTH

## 2011-05-19 ENCOUNTER — Other Ambulatory Visit (HOSPITAL_COMMUNITY): Payer: Self-pay | Admitting: Nephrology

## 2011-05-19 ENCOUNTER — Ambulatory Visit (HOSPITAL_COMMUNITY)
Admission: RE | Admit: 2011-05-19 | Discharge: 2011-05-19 | Disposition: A | Discharge status: Home / Self Care | Payer: Medicare Other | Source: Ambulatory Visit | Attending: Nephrology | Admitting: Nephrology

## 2011-05-19 DIAGNOSIS — R220 Localized swelling, mass and lump, head: Secondary | ICD-10-CM | POA: Insufficient documentation

## 2011-05-19 DIAGNOSIS — R7881 Bacteremia: Secondary | ICD-10-CM | POA: Insufficient documentation

## 2011-05-19 DIAGNOSIS — Z9889 Other specified postprocedural states: Secondary | ICD-10-CM | POA: Insufficient documentation

## 2011-05-19 DIAGNOSIS — R221 Localized swelling, mass and lump, neck: Secondary | ICD-10-CM | POA: Insufficient documentation

## 2011-05-19 DIAGNOSIS — R229 Localized swelling, mass and lump, unspecified: Secondary | ICD-10-CM | POA: Insufficient documentation

## 2011-07-27 HISTORY — PX: CORONARY ARTERY BYPASS GRAFT: SHX141

## 2011-08-04 ENCOUNTER — Encounter (HOSPITAL_COMMUNITY): Payer: Self-pay | Admitting: *Deleted

## 2011-08-04 ENCOUNTER — Emergency Department (HOSPITAL_COMMUNITY)
Admission: EM | Admit: 2011-08-04 | Discharge: 2011-08-04 | Disposition: A | Discharge status: Home / Self Care | Payer: Medicare Other | Attending: Emergency Medicine | Admitting: Emergency Medicine

## 2011-08-04 DIAGNOSIS — Z8249 Family history of ischemic heart disease and other diseases of the circulatory system: Secondary | ICD-10-CM | POA: Insufficient documentation

## 2011-08-04 DIAGNOSIS — R51 Headache: Secondary | ICD-10-CM | POA: Insufficient documentation

## 2011-08-04 DIAGNOSIS — Z833 Family history of diabetes mellitus: Secondary | ICD-10-CM | POA: Insufficient documentation

## 2011-08-04 DIAGNOSIS — N186 End stage renal disease: Secondary | ICD-10-CM | POA: Insufficient documentation

## 2011-08-04 DIAGNOSIS — Z8489 Family history of other specified conditions: Secondary | ICD-10-CM | POA: Insufficient documentation

## 2011-08-04 DIAGNOSIS — E7800 Pure hypercholesterolemia, unspecified: Secondary | ICD-10-CM | POA: Insufficient documentation

## 2011-08-04 DIAGNOSIS — I120 Hypertensive chronic kidney disease with stage 5 chronic kidney disease or end stage renal disease: Secondary | ICD-10-CM | POA: Insufficient documentation

## 2011-08-04 MED ORDER — TRAMADOL HCL 50 MG PO TABS: 50.0000 mg | ORAL_TABLET | Freq: Four times a day (QID) | ORAL | Status: AC | PRN

## 2011-08-04 MED ORDER — HYDROMORPHONE HCL PF 1 MG/ML IJ SOLN
1.0000 mg | Freq: Once | INTRAMUSCULAR | Status: AC
  Administered 2011-08-04: 1 mg via INTRAMUSCULAR
  Filled 2011-08-04: qty 1

## 2011-08-04 NOTE — ED Provider Notes (Cosign Needed)
History     CSN: [id number]  Arrival date & time 08/04/11  1217   First MD Initiated Contact with Patient 08/04/11 1250      Chief Complaint  Patient presents with  . Headache    (Consider location/radiation/quality/duration/timing/severity/associated sxs/prior treatment) Patient is a 41 y.o. male presenting with headaches. The history is provided by the patient (the pt complains of a a headache). No language interpreter was used.  Headache  This is a new problem. The current episode started 12 to 24 hours ago. The problem occurs constantly. The problem has not changed since onset.The headache is associated with nothing. The pain is located in the right unilateral region. The quality of the pain is described as dull. The pain is at a severity of 6/10. The pain is moderate. The pain does not radiate. Pertinent negatives include no anorexia. The treatment provided moderate relief.    Past Medical History  Diagnosis Date  . Hypertension   . Hypercholesteremia   . Chronic kidney disease   . ESRD (end stage renal disease) 11-2010  . Dialysis patient     Past Surgical History  Procedure Date  . Insertion of dialysis catheter 01/02/2011    Procedure: INSERTION OF DIALYSIS CATHETER;  Surgeon: Pryor Ochoa, MD;  Location: [facility] OR;  Service: Vascular;  Laterality: N/A;  Insertion of Dialysis catheter Right Internal Jugular with 24cm dialysis catheter    Family History  Problem Relation Age of Onset  . Diabetes Mother   . Hypertension Mother   . Hyperlipidemia Mother   . COPD Father     History  Substance Use Topics  . Smoking status: Never Smoker   . Smokeless tobacco: Not on file  . Alcohol Use: No      Review of Systems  Constitutional: Negative for fatigue.  HENT: Negative for congestion, sinus pressure and ear discharge.   Eyes: Negative for discharge.  Respiratory: Negative for cough.   Cardiovascular: Negative for chest pain.  Gastrointestinal: Negative for  abdominal pain, diarrhea and anorexia.  Genitourinary: Negative for frequency and hematuria.  Musculoskeletal: Negative for back pain.  Skin: Negative for rash.  Neurological: Positive for headaches. Negative for seizures.  Hematological: Negative.   Psychiatric/Behavioral: Negative for hallucinations.    Allergies  Review of patient's allergies indicates no known allergies.  Home Medications   Current Outpatient Rx  Name Route Sig Dispense Refill  . AMLODIPINE BESYLATE 10 MG PO TABS Oral Take 10 mg by mouth daily.    Marland Kitchen CALCIUM ACETATE 667 MG PO CAPS Oral Take 2,001 mg by mouth 3 (three) times daily with meals.    Marland Kitchen CARVEDILOL 3.125 MG PO TABS Oral Take 3.125 mg by mouth 2 (two) times daily.    Marland Kitchen CLONIDINE HCL 0.1 MG PO TABS Oral Take 0.1 mg by mouth 2 (two) times daily.    Marland Kitchen CLOPIDOGREL BISULFATE 75 MG PO TABS Oral Take 75 mg by mouth daily.    Marland Kitchen HYDRALAZINE HCL 25 MG PO TABS Oral Take 50 mg by mouth 2 (two) times daily.     Marland Kitchen RENA-VITE PO TABS Oral Take 1 tablet by mouth daily.      Marland Kitchen SEVELAMER HCL 800 MG PO TABS Oral Take 2,400-3,200 mg by mouth 3 (three) times daily with meals. Patient takes 4 tablets with meals and 3 tablets with snacks    . SIMVASTATIN 20 MG PO TABS Oral Take 20 mg by mouth at bedtime.    . TRAMADOL HCL 50 MG PO  TABS Oral Take 1 tablet (50 mg total) by mouth every 6 (six) hours as needed for pain. 15 tablet 0    BP 118/82  Pulse 92  Temp 97.9 F (36.6 C) (Oral)  Resp 18  Ht 5\' 6"  (1.676 m)  Wt 180 lb (81.647 kg)  BMI 29.05 kg/m2  SpO2 100%  Physical Exam  Constitutional: He is oriented to person, place, and time. He appears well-developed.  HENT:  Head: Normocephalic and atraumatic.  Eyes: Conjunctivae and EOM are normal. No scleral icterus.  Neck: Neck supple. No thyromegaly present.  Cardiovascular: Normal rate and regular rhythm.  Exam reveals no gallop and no friction rub.   No murmur heard. Pulmonary/Chest: No stridor. He has no wheezes. He  has no rales. He exhibits no tenderness.  Abdominal: He exhibits no distension. There is no tenderness. There is no rebound.  Musculoskeletal: Normal range of motion. He exhibits no edema.  Lymphadenopathy:    He has no cervical adenopathy.  Neurological: He is oriented to person, place, and time. Coordination normal.  Skin: No rash noted. No erythema.  Psychiatric: He has a normal mood and affect. His behavior is normal.    ED Course  Procedures (including critical care time)  Labs Reviewed - No data to display No results found.   1. Headache     MDM          Benny Lennert, MD 08/04/11 [phone]

## 2011-08-04 NOTE — ED Notes (Signed)
Pt c/o headache with light sensitivity.

## 2011-08-04 NOTE — ED Notes (Signed)
Pt c/o headache located in right temple area, denies any n/v, light sensitivity pt states that the pain started suddenly this am around 10:30,

## 2011-08-27 ENCOUNTER — Emergency Department (HOSPITAL_COMMUNITY): Payer: Medicare Other

## 2011-08-27 ENCOUNTER — Emergency Department (HOSPITAL_COMMUNITY)
Admission: EM | Admit: 2011-08-27 | Discharge: 2011-08-27 | Disposition: A | Discharge status: Home / Self Care | Payer: Medicare Other | Attending: Emergency Medicine | Admitting: Emergency Medicine

## 2011-08-27 ENCOUNTER — Encounter (HOSPITAL_COMMUNITY): Payer: Self-pay | Admitting: *Deleted

## 2011-08-27 DIAGNOSIS — N186 End stage renal disease: Secondary | ICD-10-CM | POA: Insufficient documentation

## 2011-08-27 DIAGNOSIS — K5900 Constipation, unspecified: Secondary | ICD-10-CM | POA: Insufficient documentation

## 2011-08-27 DIAGNOSIS — K6289 Other specified diseases of anus and rectum: Secondary | ICD-10-CM | POA: Insufficient documentation

## 2011-08-27 DIAGNOSIS — Z992 Dependence on renal dialysis: Secondary | ICD-10-CM | POA: Insufficient documentation

## 2011-08-27 DIAGNOSIS — I120 Hypertensive chronic kidney disease with stage 5 chronic kidney disease or end stage renal disease: Secondary | ICD-10-CM | POA: Insufficient documentation

## 2011-08-27 LAB — CBC WITH DIFFERENTIAL/PLATELET
Basophils Absolute: 0.1 10*3/uL (ref 0.0–0.1)
Lymphocytes Relative: 6 % — ABNORMAL LOW (ref 12–46)
Lymphs Abs: 1 10*3/uL (ref 0.7–4.0)
Neutro Abs: 13.6 10*3/uL — ABNORMAL HIGH (ref 1.7–7.7)
Neutrophils Relative %: 88 % — ABNORMAL HIGH (ref 43–77)
Platelets: 525 10*3/uL — ABNORMAL HIGH (ref 150–400)
RBC: 3.36 MIL/uL — ABNORMAL LOW (ref 4.22–5.81)
WBC: 15.5 10*3/uL — ABNORMAL HIGH (ref 4.0–10.5)

## 2011-08-27 LAB — BASIC METABOLIC PANEL
CO2: 30 mEq/L (ref 19–32)
Glucose, Bld: 104 mg/dL — ABNORMAL HIGH (ref 70–99)
Potassium: 4 mEq/L (ref 3.5–5.1)
Sodium: 139 mEq/L (ref 135–145)

## 2011-08-27 MED ORDER — HYDROMORPHONE HCL PF 2 MG/ML IJ SOLN
2.0000 mg | Freq: Once | INTRAMUSCULAR | Status: AC
  Administered 2011-08-27: 2 mg via INTRAMUSCULAR
  Filled 2011-08-27: qty 1

## 2011-08-27 MED ORDER — FLEET ENEMA 7-19 GM/118ML RE ENEM
1.0000 | ENEMA | Freq: Once | RECTAL | Status: AC
  Administered 2011-08-27: 1 via RECTAL

## 2011-08-27 MED ORDER — PEG 3350-KCL-NA BICARB-NACL 420 G PO SOLR
4000.0000 mL | Freq: Once | ORAL | Status: AC
  Administered 2011-08-27: 4000 mL via ORAL
  Filled 2011-08-27: qty 4000

## 2011-08-27 MED ORDER — HYDROMORPHONE HCL PF 1 MG/ML IJ SOLN
1.0000 mg | Freq: Once | INTRAMUSCULAR | Status: DC
Start: 2011-08-27 — End: 2011-08-27
  Filled 2011-08-27: qty 1

## 2011-08-27 MED ORDER — LORAZEPAM 2 MG/ML IJ SOLN
1.0000 mg | Freq: Once | INTRAMUSCULAR | Status: DC
  Filled 2011-08-27: qty 1

## 2011-08-27 MED ORDER — LORAZEPAM 2 MG/ML IJ SOLN
1.0000 mg | Freq: Once | INTRAMUSCULAR | Status: AC
  Administered 2011-08-27: 1 mg via INTRAMUSCULAR

## 2011-08-27 NOTE — ED Notes (Signed)
Dr Estell Harpin notified of pt's pain level.

## 2011-08-27 NOTE — ED Notes (Signed)
Pt states had surgery on the 19 th or the 12 th of July, (confirmed July 19 th Cabg  At [facility], Triple bipass ) Pt has Steri strips intact to center of chest, wound is clean edges approximated with no drainage noted. Pt presents to Ed with C/O constipation and rectal pain. Last bowel movement was July 18 th, pre-op. Pt states has been passing gas since, but no BM. Pt denies n/v at this time. Has taken " 2 stool softeners a day" since surgery without success. Abdomin is soft with hypoactive bowel sounds x 4.  Pt is alert and oriented x 4. Skin pink warm and dry.

## 2011-08-27 NOTE — ED Notes (Addendum)
Rectal pain, says no bm for 1 week, Had dialysis today.  Had CABG  In July

## 2011-08-27 NOTE — ED Provider Notes (Signed)
History   This chart was scribed for Benny Lennert, MD by Sofie Rower. The patient was seen in room [facility] and the patient's care was started at 1:13 PM     CSN: [id number]  Arrival date & time 08/27/11  1129   First MD Initiated Contact with Patient 08/27/11 1311      Chief Complaint  Patient presents with  . Rectal Pain    (Consider location/radiation/quality/duration/timing/severity/associated sxs/prior treatment) Patient is a 41 y.o. male presenting with constipation. The history is provided by the patient. No language interpreter was used.  Constipation  The current episode started today. The onset was gradual. The problem occurs continuously. The problem has been gradually worsening. The pain is moderate. There was no prior successful therapy. There was no prior unsuccessful therapy. Pertinent negatives include no fever, no diarrhea, no nausea and no vomiting.    PCP is Dr. Renard Matter.   Past Medical History  Diagnosis Date  . Hypertension   . Hypercholesteremia   . Chronic kidney disease   . ESRD (end stage renal disease) 11-2010  . Dialysis patient     Past Surgical History  Procedure Date  . Insertion of dialysis catheter 01/02/2011    Procedure: INSERTION OF DIALYSIS CATHETER;  Surgeon: Pryor Ochoa, MD;  Location: [facility] OR;  Service: Vascular;  Laterality: N/A;  Insertion of Dialysis catheter Right Internal Jugular with 24cm dialysis catheter  . Cardiac surgery     Family History  Problem Relation Age of Onset  . Diabetes Mother   . Hypertension Mother   . Hyperlipidemia Mother   . COPD Father     History  Substance Use Topics  . Smoking status: Never Smoker   . Smokeless tobacco: Not on file  . Alcohol Use: No      Review of Systems  Constitutional: Negative for fever.  Gastrointestinal: Positive for constipation. Negative for nausea, vomiting and diarrhea.  All other systems reviewed and are negative.    Allergies  Review of patient's  allergies indicates no known allergies.  Home Medications   Current Outpatient Rx  Name Route Sig Dispense Refill  . ASPIRIN 325 MG PO TBEC Oral Take 325 mg by mouth daily.    Marland Kitchen CALCIUM ACETATE 667 MG PO CAPS Oral Take 2,001 mg by mouth 3 (three) times daily with meals.    Marland Kitchen CARVEDILOL 3.125 MG PO TABS Oral Take 3.125 mg by mouth 2 (two) times daily.    Marland Kitchen CLONIDINE HCL 0.2 MG PO TABS Oral Take 0.2 mg by mouth 2 (two) times daily.    Marland Kitchen CLOPIDOGREL BISULFATE 75 MG PO TABS Oral Take 75 mg by mouth daily.    Marland Kitchen HYDRALAZINE HCL 50 MG PO TABS Oral Take 50 mg by mouth 2 (two) times daily.    Marland Kitchen METOPROLOL TARTRATE 25 MG PO TABS Oral Take 37.5 mg by mouth 2 (two) times daily.    Marland Kitchen RENA-VITE PO TABS Oral Take 1 tablet by mouth daily.      . OXYCODONE HCL 15 MG PO TABS Oral Take 15 mg by mouth every 4 (four) hours as needed. pain    . SENNA-DOCUSATE SODIUM 8.6-50 MG PO TABS Oral Take 2 tablets by mouth daily.    Marland Kitchen SEVELAMER HCL 800 MG PO TABS Oral Take 2,400-3,200 mg by mouth 3 (three) times daily with meals. Patient takes 4 tablets with meals and 3 tablets with snacks    . SIMVASTATIN 20 MG PO TABS Oral Take 20 mg by  mouth at bedtime.      BP 141/78  Pulse 89  Temp 98 F (36.7 C) (Oral)  Resp 20  Ht 5\' 6"  (1.676 m)  Wt 160 lb (72.576 kg)  BMI 25.82 kg/m2  SpO2 100%  Physical Exam  Nursing note and vitals reviewed. Constitutional: He is oriented to person, place, and time. He appears well-developed.  HENT:  Head: Normocephalic and atraumatic.  Eyes: Conjunctivae and EOM are normal. No scleral icterus.  Neck: Neck supple. No thyromegaly present.  Cardiovascular: Normal rate and regular rhythm.  Exam reveals no gallop and no friction rub.   No murmur heard. Pulmonary/Chest: No stridor. He has no wheezes. He has no rales. He exhibits no tenderness.       Healed incision at the chest.   Abdominal: He exhibits no distension. There is no tenderness. There is no rebound.  Musculoskeletal:  Normal range of motion. He exhibits no edema.  Lymphadenopathy:    He has no cervical adenopathy.  Neurological: He is oriented to person, place, and time. Coordination normal.  Skin: No rash noted. No erythema.  Psychiatric: He has a normal mood and affect. His behavior is normal.    ED Course  Procedures (including critical care time)   Medications  oxyCODONE (ROXICODONE) 15 MG immediate release tablet (not administered)  hydrALAZINE (APRESOLINE) 50 MG tablet (not administered)  metoprolol tartrate (LOPRESSOR) 25 MG tablet (not administered)  cloNIDine (CATAPRES) 0.2 MG tablet (not administered)  aspirin 325 MG EC tablet (not administered)  sennosides-docusate sodium (SENOKOT-S) 8.6-50 MG tablet (not administered)  HYDROmorphone (DILAUDID) injection 2 mg (2 mg Intramuscular Given 08/27/11 1432)  LORazepam (ATIVAN) injection 1 mg (1 mg Intramuscular Given 08/27/11 1433)  sodium phosphate (FLEET) 7-19 GM/118ML enema 1 enema (1 enema Rectal Given 08/27/11 1531)     DIAGNOSTIC STUDIES: Oxygen Saturation is 100% on room air, normal by my interpretation.    COORDINATION OF CARE:    1:17PM- Treatment plan discussed with pt. Pt agrees with treatment. Rectal exam to be performed.   Results for orders placed during the hospital encounter of 08/27/11  CBC WITH DIFFERENTIAL      Component Value Range   WBC 15.5 (*) 4.0 - 10.5 K/uL   RBC 3.36 (*) 4.22 - 5.81 MIL/uL   Hemoglobin 10.2 (*) 13.0 - 17.0 g/dL   HCT 56.2 (*) 13.0 - 86.5 %   MCV 92.3  78.0 - 100.0 fL   MCH 30.4  26.0 - 34.0 pg   MCHC 32.9  30.0 - 36.0 g/dL   RDW 78.4  69.6 - 29.5 %   Platelets 525 (*) 150 - 400 K/uL   Neutrophils Relative 88 (*) 43 - 77 %   Neutro Abs 13.6 (*) 1.7 - 7.7 K/uL   Lymphocytes Relative 6 (*) 12 - 46 %   Lymphs Abs 1.0  0.7 - 4.0 K/uL   Monocytes Relative 4  3 - 12 %   Monocytes Absolute 0.7  0.1 - 1.0 K/uL   Eosinophils Relative 1  0 - 5 %   Eosinophils Absolute 0.2  0.0 - 0.7 K/uL    Basophils Relative 0  0 - 1 %   Basophils Absolute 0.1  0.0 - 0.1 K/uL  BASIC METABOLIC PANEL      Component Value Range   Sodium 139  135 - 145 mEq/L   Potassium 4.0  3.5 - 5.1 mEq/L   Chloride 96  96 - 112 mEq/L   CO2 30  19 -  32 mEq/L   Glucose, Bld 104 (*) 70 - 99 mg/dL   BUN 21  6 - 23 mg/dL   Creatinine, Ser 7.82 (*) 0.50 - 1.35 mg/dL   Calcium 9.6  8.4 - 95.6 mg/dL   GFR calc non Af Amer 9 (*) >90 mL/min   GFR calc Af Amer 10 (*) >90 mL/min      No results found.   No diagnosis found.    MDM  The chart was scribed for me under my direct supervision.  I personally performed the history, physical, and medical decision making and all procedures in the evaluation of this patient.Benny Lennert, MD 08/27/11 [phone]

## 2011-08-27 NOTE — ED Notes (Signed)
States enema is not working, edp notified and ct scan of abdomen ordered, patient informed of same

## 2011-08-27 NOTE — ED Notes (Signed)
PIV attempted x 6 by 3 RN's without success. Dr Estell Harpin notified.

## 2011-12-27 ENCOUNTER — Encounter (HOSPITAL_COMMUNITY): Payer: Self-pay | Admitting: Emergency Medicine

## 2011-12-27 ENCOUNTER — Emergency Department (HOSPITAL_COMMUNITY)
Admission: EM | Admit: 2011-12-27 | Discharge: 2011-12-27 | Disposition: A | Discharge status: Home / Self Care | Payer: Medicare Other | Attending: Emergency Medicine | Admitting: Emergency Medicine

## 2011-12-27 ENCOUNTER — Emergency Department (HOSPITAL_COMMUNITY): Payer: Medicare Other

## 2011-12-27 DIAGNOSIS — Z7982 Long term (current) use of aspirin: Secondary | ICD-10-CM | POA: Insufficient documentation

## 2011-12-27 DIAGNOSIS — Z79899 Other long term (current) drug therapy: Secondary | ICD-10-CM | POA: Insufficient documentation

## 2011-12-27 DIAGNOSIS — Z992 Dependence on renal dialysis: Secondary | ICD-10-CM | POA: Insufficient documentation

## 2011-12-27 DIAGNOSIS — E7800 Pure hypercholesterolemia, unspecified: Secondary | ICD-10-CM | POA: Insufficient documentation

## 2011-12-27 DIAGNOSIS — I129 Hypertensive chronic kidney disease with stage 1 through stage 4 chronic kidney disease, or unspecified chronic kidney disease: Secondary | ICD-10-CM | POA: Insufficient documentation

## 2011-12-27 DIAGNOSIS — J40 Bronchitis, not specified as acute or chronic: Secondary | ICD-10-CM | POA: Insufficient documentation

## 2011-12-27 DIAGNOSIS — N186 End stage renal disease: Secondary | ICD-10-CM | POA: Insufficient documentation

## 2011-12-27 DIAGNOSIS — N189 Chronic kidney disease, unspecified: Secondary | ICD-10-CM | POA: Insufficient documentation

## 2011-12-27 MED ORDER — ALBUTEROL SULFATE HFA 108 (90 BASE) MCG/ACT IN AERS: 2.0000 | INHALATION_SPRAY | RESPIRATORY_TRACT | Status: DC | PRN

## 2011-12-27 MED ORDER — ALBUTEROL SULFATE (5 MG/ML) 0.5% IN NEBU
2.5000 mg | INHALATION_SOLUTION | Freq: Once | RESPIRATORY_TRACT | Status: AC
  Administered 2011-12-27: 2.5 mg via RESPIRATORY_TRACT
  Filled 2011-12-27: qty 0.5

## 2011-12-27 MED ORDER — DOXYCYCLINE HYCLATE 100 MG PO CAPS: 100.0000 mg | ORAL_CAPSULE | Freq: Two times a day (BID) | ORAL | Status: DC

## 2011-12-27 MED ORDER — PREDNISONE 10 MG PO TABS
60.0000 mg | ORAL_TABLET | Freq: Once | ORAL | Status: AC
  Administered 2011-12-27: 60 mg via ORAL
  Filled 2011-12-27: qty 1

## 2011-12-27 MED ORDER — PREDNISONE 50 MG PO TABS: ORAL_TABLET | ORAL | Status: DC

## 2011-12-27 MED ORDER — IPRATROPIUM BROMIDE 0.02 % IN SOLN
0.5000 mg | Freq: Once | RESPIRATORY_TRACT | Status: AC
  Administered 2011-12-27: 0.5 mg via RESPIRATORY_TRACT
  Filled 2011-12-27: qty 2.5

## 2011-12-27 NOTE — ED Notes (Signed)
Patient ambulated without difficulty around nurses station. Patients oxygen saturation maintained between 94%-99%. NAD. No needs voiced at this time.

## 2011-12-27 NOTE — ED Notes (Signed)
Pt c/o coughing and wheezing since last night.

## 2011-12-27 NOTE — ED Notes (Signed)
Pt discharged. Pt stable at time of discharge. Medications reviewed pt has no questions regarding discharge at this time. Pt voiced understanding of discharge instructions.  

## 2011-12-27 NOTE — ED Provider Notes (Signed)
History   This chart was scribed for Mario Octave, MD by Leone Payor, ED Scribe. This patient was seen in room APA06/APA06 and the patient's care was started at 2125.   CSN: [id number]  Arrival date & time 12/27/11  4098   First MD Initiated Contact with Patient 12/27/11 2125      Chief Complaint  Patient presents with  . Wheezing  . Cough    The history is provided by the patient. No language interpreter was used.    Mario Alexander is a 41 y.o. male dialysis pt who presents to the Emergency Department complaining of coughing and wheezing starting 1 days ago in the chest. Pt reports taking a pill for sneezing/coughing with minimal relief. Pt reports he has been in the hospital for asthma in the past, and has h/o of COPD as well as bypass surgery. Pt is positive for headache and sore throat but denies fever, SOB, chest pain, stomach pain.    Past Medical History  Diagnosis Date  . Hypertension   . Hypercholesteremia   . Chronic kidney disease   . ESRD (end stage renal disease) 11-2010  . Dialysis patient     Past Surgical History  Procedure Date  . Insertion of dialysis catheter 01/02/2011    Procedure: INSERTION OF DIALYSIS CATHETER;  Surgeon: Pryor Ochoa, MD;  Location: [facility] OR;  Service: Vascular;  Laterality: N/A;  Insertion of Dialysis catheter Right Internal Jugular with 24cm dialysis catheter  . Cardiac surgery     Family History  Problem Relation Age of Onset  . Diabetes Mother   . Hypertension Mother   . Hyperlipidemia Mother   . COPD Father     History  Substance Use Topics  . Smoking status: Never Smoker   . Smokeless tobacco: Not on file  . Alcohol Use: No      Review of Systems  Constitutional: Negative for fever.  HENT: Positive for sore throat.   Respiratory: Negative for shortness of breath.   Cardiovascular: Negative for chest pain.  Gastrointestinal: Negative for abdominal pain.  Neurological: Positive for headaches.  All other systems  reviewed and are negative.    Allergies  Review of patient's allergies indicates no known allergies.  Home Medications   Current Outpatient Rx  Name  Route  Sig  Dispense  Refill  . ASPIRIN 325 MG PO TBEC   Oral   Take 325 mg by mouth daily.         Marland Kitchen CARVEDILOL 3.125 MG PO TABS   Oral   Take 3.125 mg by mouth 2 (two) times daily.         Marland Kitchen CLONIDINE HCL 0.2 MG PO TABS   Oral   Take 0.2 mg by mouth 2 (two) times daily.         Marland Kitchen HYDRALAZINE HCL 50 MG PO TABS   Oral   Take 50 mg by mouth 2 (two) times daily.         Marland Kitchen METOPROLOL TARTRATE 25 MG PO TABS   Oral   Take 37.5 mg by mouth 2 (two) times daily.         Marland Kitchen RENA-VITE PO TABS   Oral   Take 1 tablet by mouth daily.           . OXYCODONE HCL 15 MG PO TABS   Oral   Take 15 mg by mouth every 4 (four) hours as needed. pain         .  SENNA-DOCUSATE SODIUM 8.6-50 MG PO TABS   Oral   Take 2 tablets by mouth daily.         Marland Kitchen SEVELAMER HCL 800 MG PO TABS   Oral   Take 2,400-3,200 mg by mouth 3 (three) times daily with meals. Patient takes 4 tablets with meals and 3 tablets with snacks         . SIMVASTATIN 20 MG PO TABS   Oral   Take 20 mg by mouth at bedtime.         . ALBUTEROL SULFATE HFA 108 (90 BASE) MCG/ACT IN AERS   Inhalation   Inhale 2 puffs into the lungs every 4 (four) hours as needed for wheezing.   1 Inhaler   0   . DOXYCYCLINE HYCLATE 100 MG PO CAPS   Oral   Take 1 capsule (100 mg total) by mouth 2 (two) times daily.   20 capsule   0   . PREDNISONE 50 MG PO TABS      1 tablet PO daily   5 tablet   0     BP 106/63  Pulse 74  Temp 97.4 F (36.3 C) (Oral)  Resp 18  Ht 5\' 6"  (1.676 m)  Wt 170 lb (77.111 kg)  BMI 27.44 kg/m2  SpO2 98%  Physical Exam  Nursing note and vitals reviewed. Constitutional: He is oriented to person, place, and time. He appears well-developed and well-nourished. No distress.  HENT:  Head: Normocephalic and atraumatic.  Eyes: EOM are  normal. Pupils are equal, round, and reactive to light.  Neck: Normal range of motion. Neck supple. No tracheal deviation present.  Cardiovascular: Normal rate, regular rhythm and normal heart sounds.   Pulmonary/Chest: Effort normal and breath sounds normal. No respiratory distress. He has no wheezes.  Abdominal: Soft. Bowel sounds are normal. There is no tenderness.  Musculoskeletal: Normal range of motion.       Left upper extremity fistula with thrill and bruity. No peripheral edema.   Neurological: He is alert and oriented to person, place, and time.  Skin: Skin is warm and dry.  Psychiatric: He has a normal mood and affect. His behavior is normal.    ED Course  Procedures (including critical care time)  DIAGNOSTIC STUDIES: Oxygen Saturation is 97% on room air, normal by my interpretation.    COORDINATION OF CARE:  9:32 PM Discussed treatment plan which includes chest x-ray with pt at bedside and pt agreed to plan.    Labs Reviewed - No data to display Dg Chest 2 View  12/27/2011  *RADIOLOGY REPORT*  Clinical Data: Cough.  Wheezing.  CHEST - 2 VIEW  Comparison:  05/07/2011  Findings:  The heart size and mediastinal contours are within normal limits.  Both lungs are clear.  The visualized skeletal structures are unremarkable.  Interval CABG noted.  IMPRESSION: No active cardiopulmonary disease.   Original Report Authenticated By: Myles Rosenthal, M.D.      1. Bronchitis       MDM  Dialysis patient with coughing and wheezing for the past 2 days. Denies history of asthma but states he is COPD. No inhaler use at home. No chest pain or shortness of breath. No fever. Last dialysis yesterday.  EKG nonischemic.  No chest pain or SOB.  CXR negative. No distress. Will treat for bronchitis.   Date: 12/27/2011  Rate: 63  Rhythm: normal sinus rhythm  QRS Axis: normal  Intervals: normal  ST/T Wave abnormalities: nonspecific ST/T changes  Conduction Disutrbances:none  Narrative  Interpretation: LVH or  Old EKG Reviewed: unchanged        I personally performed the services described in this documentation, which was scribed in my presence. The recorded information has been reviewed and is accurate.      Mario Octave, MD 12/28/11 [phone]

## 2011-12-27 NOTE — ED Notes (Signed)
Patient

## 2012-10-13 ENCOUNTER — Encounter (HOSPITAL_COMMUNITY): Payer: Self-pay

## 2012-10-13 ENCOUNTER — Emergency Department (HOSPITAL_COMMUNITY)
Admission: EM | Admit: 2012-10-13 | Discharge: 2012-10-13 | Disposition: A | Discharge status: Home / Self Care | Payer: Medicare Other | Attending: Emergency Medicine | Admitting: Emergency Medicine

## 2012-10-13 DIAGNOSIS — N186 End stage renal disease: Secondary | ICD-10-CM | POA: Insufficient documentation

## 2012-10-13 DIAGNOSIS — I10 Essential (primary) hypertension: Secondary | ICD-10-CM | POA: Insufficient documentation

## 2012-10-13 DIAGNOSIS — E7800 Pure hypercholesterolemia, unspecified: Secondary | ICD-10-CM | POA: Insufficient documentation

## 2012-10-13 DIAGNOSIS — Z7982 Long term (current) use of aspirin: Secondary | ICD-10-CM | POA: Insufficient documentation

## 2012-10-13 DIAGNOSIS — T169XXA Foreign body in ear, unspecified ear, initial encounter: Secondary | ICD-10-CM | POA: Insufficient documentation

## 2012-10-13 DIAGNOSIS — Y9229 Other specified public building as the place of occurrence of the external cause: Secondary | ICD-10-CM | POA: Insufficient documentation

## 2012-10-13 DIAGNOSIS — Z792 Long term (current) use of antibiotics: Secondary | ICD-10-CM | POA: Insufficient documentation

## 2012-10-13 DIAGNOSIS — Y9389 Activity, other specified: Secondary | ICD-10-CM | POA: Insufficient documentation

## 2012-10-13 DIAGNOSIS — Z992 Dependence on renal dialysis: Secondary | ICD-10-CM | POA: Insufficient documentation

## 2012-10-13 DIAGNOSIS — Z79899 Other long term (current) drug therapy: Secondary | ICD-10-CM | POA: Insufficient documentation

## 2012-10-13 DIAGNOSIS — S00451A Superficial foreign body of right ear, initial encounter: Secondary | ICD-10-CM

## 2012-10-13 DIAGNOSIS — IMO0002 Reserved for concepts with insufficient information to code with codable children: Secondary | ICD-10-CM | POA: Insufficient documentation

## 2012-10-13 MED ORDER — NEOMYCIN-POLYMYXIN-HC 1 % OT SOLN
4.0000 [drp] | Freq: Four times a day (QID) | OTIC | Status: DC
  Administered 2012-10-13: 4 [drp] via OTIC
  Filled 2012-10-13: qty 10

## 2012-10-13 MED ORDER — LIDOCAINE HCL (PF) 2 % IJ SOLN
INTRAMUSCULAR | Status: AC
  Filled 2012-10-13: qty 10

## 2012-10-13 NOTE — ED Provider Notes (Signed)
CSN: [id number]     Arrival date & time 10/13/12  0559 History   First MD Initiated Contact with Patient 10/13/12 [phone]     Chief Complaint  Patient presents with  . Foreign Body in Ear   (Consider location/radiation/quality/duration/timing/severity/associated sxs/prior Treatment) HPI This is a 42 year old male who was at dialysis this morning preparing to be dialyzed when he felt something fly into his right ear. He is having some discomfort but he does not describe it as pain. The symptoms are mild to moderate. He believes he has an insect in there. He states it feels like his ear is vibrating. He denies other complaint.   Past Medical History  Diagnosis Date  . Hypertension   . Hypercholesteremia   . Chronic kidney disease   . ESRD (end stage renal disease) 11-2010  . Dialysis patient    Past Surgical History  Procedure Laterality Date  . Insertion of dialysis catheter  01/02/2011    Procedure: INSERTION OF DIALYSIS CATHETER;  Surgeon: Pryor Ochoa, MD;  Location: [facility] OR;  Service: Vascular;  Laterality: N/A;  Insertion of Dialysis catheter Right Internal Jugular with 24cm dialysis catheter  . Cardiac surgery     Family History  Problem Relation Age of Onset  . Diabetes Mother   . Hypertension Mother   . Hyperlipidemia Mother   . COPD Father    History  Substance Use Topics  . Smoking status: Never Smoker   . Smokeless tobacco: Not on file  . Alcohol Use: No    Review of Systems  All other systems reviewed and are negative.    Allergies  Review of patient's allergies indicates no known allergies.  Home Medications   Current Outpatient Rx  Name  Route  Sig  Dispense  Refill  . carvedilol (COREG) 3.125 MG tablet   Oral   Take 3.125 mg by mouth 2 (two) times daily.         . cloNIDine (CATAPRES) 0.2 MG tablet   Oral   Take 0.2 mg by mouth 2 (two) times daily.         . hydrALAZINE (APRESOLINE) 50 MG tablet   Oral   Take 50 mg by mouth 2 (two) times  daily.         . metoprolol tartrate (LOPRESSOR) 25 MG tablet   Oral   Take 37.5 mg by mouth 2 (two) times daily.         . multivitamin (RENA-VIT) TABS tablet   Oral   Take 1 tablet by mouth daily.           Marland Kitchen oxyCODONE (ROXICODONE) 15 MG immediate release tablet   Oral   Take 15 mg by mouth every 4 (four) hours as needed. pain         . predniSONE (DELTASONE) 50 MG tablet      1 tablet PO daily   5 tablet   0   . sennosides-docusate sodium (SENOKOT-S) 8.6-50 MG tablet   Oral   Take 2 tablets by mouth daily.         . sevelamer (RENAGEL) 800 MG tablet   Oral   Take 2,400-3,200 mg by mouth 3 (three) times daily with meals. Patient takes 4 tablets with meals and 3 tablets with snacks         . simvastatin (ZOCOR) 20 MG tablet   Oral   Take 20 mg by mouth at bedtime.         Marland Kitchen albuterol (PROVENTIL  HFA;VENTOLIN HFA) 108 (90 BASE) MCG/ACT inhaler   Inhalation   Inhale 2 puffs into the lungs every 4 (four) hours as needed for wheezing.   1 Inhaler   0   . aspirin 325 MG EC tablet   Oral   Take 325 mg by mouth daily.         Marland Kitchen doxycycline (VIBRAMYCIN) 100 MG capsule   Oral   Take 1 capsule (100 mg total) by mouth 2 (two) times daily.   20 capsule   0    There were no vitals taken for this visit.  Physical Exam General: Well-developed, well-nourished male in no acute distress; appearance consistent with age of record HENT: normocephalic; atraumatic; small insect seen in the right external auditory canal near tympanic membrane which appears to be biting the wall of the canal Eyes: Normal appearance Neck: supple Heart: regular rate and rhythm Lungs: Normal respiratory effort and excursion Abdomen: soft; nondistended Extremities: No deformity; full range of motion; dialysis fistula left forearm with pulse and thrill Neurologic: Awake, alert and oriented; motor function intact in all extremities and symmetric; no facial droop Skin: Warm and  dry Psychiatric: Normal mood and affect    ED Course  Procedures (including critical care time)  FOREIGN BODY REMOVAL The insect in the patient's right external auditory canal was removed by flushing with 10 mL of 2% lidocaine without epinephrine. The patient experienced immediate relief. There was some bleeding noted afterwards. There were no other complications and the patient tolerated the procedure well.  MDM      Hanley Seamen, MD 10/13/12 [phone]

## 2012-10-13 NOTE — ED Notes (Signed)
Patient given discharge instruction, verbalized understand. Patient ambulatory out of the department.  

## 2012-10-13 NOTE — ED Notes (Signed)
Pt awoke this am with what he thinks is a bug in right ear

## 2012-10-13 NOTE — ED Notes (Signed)
Supplies gathered, MD flushed bug out of ear, pt tolerated well.

## 2012-12-03 ENCOUNTER — Emergency Department (HOSPITAL_COMMUNITY): Payer: Medicare Other

## 2012-12-03 ENCOUNTER — Encounter (HOSPITAL_COMMUNITY): Payer: Self-pay | Admitting: Emergency Medicine

## 2012-12-03 ENCOUNTER — Emergency Department (HOSPITAL_COMMUNITY)
Admission: EM | Admit: 2012-12-03 | Discharge: 2012-12-03 | Disposition: A | Discharge status: Home / Self Care | Payer: Medicare Other | Attending: Emergency Medicine | Admitting: Emergency Medicine

## 2012-12-03 DIAGNOSIS — H53149 Visual discomfort, unspecified: Secondary | ICD-10-CM | POA: Insufficient documentation

## 2012-12-03 DIAGNOSIS — R0789 Other chest pain: Secondary | ICD-10-CM | POA: Insufficient documentation

## 2012-12-03 DIAGNOSIS — H538 Other visual disturbances: Secondary | ICD-10-CM | POA: Insufficient documentation

## 2012-12-03 DIAGNOSIS — I120 Hypertensive chronic kidney disease with stage 5 chronic kidney disease or end stage renal disease: Secondary | ICD-10-CM | POA: Insufficient documentation

## 2012-12-03 DIAGNOSIS — R51 Headache: Secondary | ICD-10-CM | POA: Insufficient documentation

## 2012-12-03 DIAGNOSIS — Z79899 Other long term (current) drug therapy: Secondary | ICD-10-CM | POA: Insufficient documentation

## 2012-12-03 DIAGNOSIS — Z7982 Long term (current) use of aspirin: Secondary | ICD-10-CM | POA: Insufficient documentation

## 2012-12-03 DIAGNOSIS — Z992 Dependence on renal dialysis: Secondary | ICD-10-CM | POA: Insufficient documentation

## 2012-12-03 DIAGNOSIS — E7800 Pure hypercholesterolemia, unspecified: Secondary | ICD-10-CM | POA: Insufficient documentation

## 2012-12-03 DIAGNOSIS — N186 End stage renal disease: Secondary | ICD-10-CM | POA: Insufficient documentation

## 2012-12-03 MED ORDER — HYDROCODONE-ACETAMINOPHEN 5-325 MG PO TABS: 1.0000 | ORAL_TABLET | Freq: Two times a day (BID) | ORAL | Status: DC

## 2012-12-03 MED ORDER — HYDROCODONE-ACETAMINOPHEN 5-325 MG PO TABS
1.0000 | ORAL_TABLET | Freq: Once | ORAL | Status: AC
  Administered 2012-12-03: 1 via ORAL
  Filled 2012-12-03: qty 1

## 2012-12-03 NOTE — ED Notes (Signed)
Pt states pain to right side of head began after dialysis this morning. States he is also seeing spots.

## 2012-12-03 NOTE — ED Provider Notes (Signed)
CSN: [id number]     Arrival date & time 12/03/12  1252 History   First MD Initiated Contact with Patient 12/03/12 1514     This chart was scribed for Shelda Jakes, MD by Manuela Schwartz, ED scribe. This patient was seen in room APA08/APA08 and the patient's care was started at 1514.  Chief Complaint  Patient presents with  . Headache  . seeing spots    Patient is a 42 y.o. male presenting with headaches. The history is provided by the patient. No language interpreter was used.  Headache Pain location:  R temporal Quality:  Sharp Radiates to:  Does not radiate Severity currently:  5/10 Severity at highest:  10/10 Duration:  4 hours Timing:  Constant Progression:  Improving Chronicity:  New Similar to prior headaches: no   Context: bright light   Context: not activity and not coughing   Relieved by:  Nothing Worsened by:  Light Ineffective treatments:  NSAIDs Associated symptoms: photophobia and visual change   Associated symptoms: no back pain, no cough, no dizziness, no fever, no nausea, no numbness, no URI, no vomiting and no weakness    HPI Comments: KENICHI CASSADA is a 42 y.o. male who presents to the Emergency Department complaining of a sharp 10/10 max and 5/10 curent, non-throbbing, non-radiating, right sided temporal HA, onset 4 hours ago after he finished dialysis 6 hours ago (dialysis was 530 AM until 10 AM and goes tues/thurs/saturday). He states has not had this type of HA before. He reports associated visual aura or "seeing spots" and mild photophobia. He took x2 ibuprofen at home w/no relief from HA. He states HA has improved since onset. He also c/o some mild CP. He denies associated nausea, emesis, dizziness or any numbness/weakness over his body/extrimities.   He denies fever/chills, cough, rhinorrhea, sore throat, pitting edema, dysuria, neck/back pain, dizziness.   He has no allergies to medicines.   His PCP is Dr. Renard Matter  Past Medical History  Diagnosis Date   . Hypertension   . Hypercholesteremia   . Chronic kidney disease   . ESRD (end stage renal disease) 11-2010  . Dialysis patient    Past Surgical History  Procedure Laterality Date  . Insertion of dialysis catheter  01/02/2011    Procedure: INSERTION OF DIALYSIS CATHETER;  Surgeon: Pryor Ochoa, MD;  Location: [facility] OR;  Service: Vascular;  Laterality: N/A;  Insertion of Dialysis catheter Right Internal Jugular with 24cm dialysis catheter  . Cardiac surgery     Family History  Problem Relation Age of Onset  . Diabetes Mother   . Hypertension Mother   . Hyperlipidemia Mother   . COPD Father    History  Substance Use Topics  . Smoking status: Never Smoker   . Smokeless tobacco: Not on file  . Alcohol Use: No    Review of Systems  Constitutional: Negative for fever and chills.  Eyes: Positive for photophobia.  Respiratory: Negative for cough and shortness of breath.   Gastrointestinal: Negative for nausea and vomiting.  Musculoskeletal: Negative for back pain.  Neurological: Positive for headaches. Negative for dizziness, weakness and numbness.  All other systems reviewed and are negative.   A complete 10 system review of systems was obtained and all systems are negative except as noted in the HPI and PMH.   Allergies  Review of patient's allergies indicates no known allergies.  Home Medications   Current Outpatient Rx  Name  Route  Sig  Dispense  Refill  .  albuterol (PROVENTIL HFA;VENTOLIN HFA) 108 (90 BASE) MCG/ACT inhaler   Inhalation   Inhale 2 puffs into the lungs every 4 (four) hours as needed for wheezing.   1 Inhaler   0   . aspirin 325 MG EC tablet   Oral   Take 325 mg by mouth daily.         . carvedilol (COREG) 3.125 MG tablet   Oral   Take 3.125 mg by mouth 2 (two) times daily.         . cloNIDine (CATAPRES) 0.2 MG tablet   Oral   Take 0.2 mg by mouth 2 (two) times daily.         . hydrALAZINE (APRESOLINE) 50 MG tablet   Oral   Take 50 mg  by mouth 2 (two) times daily.         . metoprolol tartrate (LOPRESSOR) 25 MG tablet   Oral   Take 37.5 mg by mouth 2 (two) times daily.         . multivitamin (RENA-VIT) TABS tablet   Oral   Take 1 tablet by mouth daily.           . sennosides-docusate sodium (SENOKOT-S) 8.6-50 MG tablet   Oral   Take 2 tablets by mouth daily.         . sevelamer (RENAGEL) 800 MG tablet   Oral   Take 2,400-3,200 mg by mouth 3 (three) times daily with meals. Patient takes 4 tablets with meals and 3 tablets with snacks         . simvastatin (ZOCOR) 20 MG tablet   Oral   Take 20 mg by mouth at bedtime.         Marland Kitchen HYDROcodone-acetaminophen (NORCO/VICODIN) 5-325 MG per tablet   Oral   Take 1-2 tablets by mouth 2 (two) times daily.   6 tablet   0    Triage Vitals: BP 145/94  Pulse 86  Temp(Src) 97.5 F (36.4 C) (Oral)  Resp 16  Ht 5\' 6"  (1.676 m)  Wt 190 lb (86.183 kg)  BMI 30.68 kg/m2  SpO2 100% Physical Exam  Nursing note and vitals reviewed. Constitutional: He is oriented to person, place, and time. He appears well-developed and well-nourished. No distress.  HENT:  Head: Normocephalic and atraumatic.  Mouth/Throat: Oropharynx is clear and moist. No oropharyngeal exudate.  Eyes: Conjunctivae and EOM are normal. Pupils are equal, round, and reactive to light. Right eye exhibits no discharge. Left eye exhibits no discharge.  Neck: Neck supple. No tracheal deviation present.  Cardiovascular: Normal rate, regular rhythm, normal heart sounds and intact distal pulses.   No murmur heard. Pulmonary/Chest: Effort normal and breath sounds normal. No respiratory distress. He has no wheezes. He has no rales.  Abdominal: Soft. Bowel sounds are normal. He exhibits no distension. There is no tenderness.  Musculoskeletal: Normal range of motion. He exhibits no edema and no tenderness.  Good ROM/strength of all extremities.   Neurological: He is alert and oriented to person, place, and  time. No cranial nerve deficit.  Skin: Skin is warm and dry.  Av fistula left forearm  Psychiatric: He has a normal mood and affect. His behavior is normal.    ED Course  Procedures (including critical care time) DIAGNOSTIC STUDIES: Oxygen Saturation is 100% on room air, normal by my interpretation.    COORDINATION OF CARE: At 330 PM Discussed treatment plan with patient which includes hydrocodone-acetaminophen, head CT. Patient agrees.   Labs Review Labs Reviewed -  No data to display Imaging Review Ct Head Wo Contrast  12/03/2012   CLINICAL DATA:  Headache.  EXAM: CT HEAD WITHOUT CONTRAST  TECHNIQUE: Contiguous axial images were obtained from the base of the skull through the vertex without intravenous contrast.  COMPARISON:  12/07/2010  FINDINGS: The ventricles, cisterns and other CSF spaces are within normal. There is no mass, mass effect, shift of midline structures or acute hemorrhage. There is no acute infarction. There is mild opacification over the left sphenoid sinus unchanged.  IMPRESSION: No acute intracranial findings.  Chronic sinus inflammatory disease.   Electronically Signed   By: Elberta Fortis M.D.   On: 12/03/2012 16:19    EKG Interpretation   None       MDM   1. Headache    Patient status post dialysis this morning. Following dialysis he developed a right temporal area headache which is now improved significantly. He was improving prior to arrival. Patient had some faint seen spots and some faint light sensitivity but nothing girl severe no focal neuro deficits. Head CT was negative. Patient will be discharged home.    I personally performed the services described in this documentation, which was scribed in my presence. The recorded information has been reviewed and is accurate.        Shelda Jakes, MD 12/03/12 [phone]

## 2013-01-27 DIAGNOSIS — N186 End stage renal disease: Secondary | ICD-10-CM | POA: Diagnosis not present

## 2013-01-27 DIAGNOSIS — N2581 Secondary hyperparathyroidism of renal origin: Secondary | ICD-10-CM | POA: Diagnosis not present

## 2013-01-27 DIAGNOSIS — D509 Iron deficiency anemia, unspecified: Secondary | ICD-10-CM | POA: Diagnosis not present

## 2013-02-01 DIAGNOSIS — N186 End stage renal disease: Secondary | ICD-10-CM | POA: Diagnosis not present

## 2013-02-25 DIAGNOSIS — N186 End stage renal disease: Secondary | ICD-10-CM | POA: Diagnosis not present

## 2013-02-27 DIAGNOSIS — D509 Iron deficiency anemia, unspecified: Secondary | ICD-10-CM | POA: Diagnosis not present

## 2013-02-27 DIAGNOSIS — N2581 Secondary hyperparathyroidism of renal origin: Secondary | ICD-10-CM | POA: Diagnosis not present

## 2013-02-27 DIAGNOSIS — N186 End stage renal disease: Secondary | ICD-10-CM | POA: Diagnosis not present

## 2013-03-01 DIAGNOSIS — N186 End stage renal disease: Secondary | ICD-10-CM | POA: Diagnosis not present

## 2013-03-25 DIAGNOSIS — N186 End stage renal disease: Secondary | ICD-10-CM | POA: Diagnosis not present

## 2013-03-27 DIAGNOSIS — D509 Iron deficiency anemia, unspecified: Secondary | ICD-10-CM | POA: Diagnosis not present

## 2013-03-27 DIAGNOSIS — N2581 Secondary hyperparathyroidism of renal origin: Secondary | ICD-10-CM | POA: Diagnosis not present

## 2013-03-27 DIAGNOSIS — N186 End stage renal disease: Secondary | ICD-10-CM | POA: Diagnosis not present

## 2013-04-25 DIAGNOSIS — N186 End stage renal disease: Secondary | ICD-10-CM | POA: Diagnosis not present

## 2013-04-26 DIAGNOSIS — N2581 Secondary hyperparathyroidism of renal origin: Secondary | ICD-10-CM | POA: Diagnosis not present

## 2013-04-26 DIAGNOSIS — N186 End stage renal disease: Secondary | ICD-10-CM | POA: Diagnosis not present

## 2013-04-26 DIAGNOSIS — D509 Iron deficiency anemia, unspecified: Secondary | ICD-10-CM | POA: Diagnosis not present

## 2013-04-28 DIAGNOSIS — D509 Iron deficiency anemia, unspecified: Secondary | ICD-10-CM | POA: Diagnosis not present

## 2013-04-28 DIAGNOSIS — N2581 Secondary hyperparathyroidism of renal origin: Secondary | ICD-10-CM | POA: Diagnosis not present

## 2013-04-28 DIAGNOSIS — N186 End stage renal disease: Secondary | ICD-10-CM | POA: Diagnosis not present

## 2013-05-01 DIAGNOSIS — N186 End stage renal disease: Secondary | ICD-10-CM | POA: Diagnosis not present

## 2013-05-01 DIAGNOSIS — N2581 Secondary hyperparathyroidism of renal origin: Secondary | ICD-10-CM | POA: Diagnosis not present

## 2013-05-01 DIAGNOSIS — D509 Iron deficiency anemia, unspecified: Secondary | ICD-10-CM | POA: Diagnosis not present

## 2013-05-03 DIAGNOSIS — D509 Iron deficiency anemia, unspecified: Secondary | ICD-10-CM | POA: Diagnosis not present

## 2013-05-03 DIAGNOSIS — N186 End stage renal disease: Secondary | ICD-10-CM | POA: Diagnosis not present

## 2013-05-03 DIAGNOSIS — N2581 Secondary hyperparathyroidism of renal origin: Secondary | ICD-10-CM | POA: Diagnosis not present

## 2013-05-04 DIAGNOSIS — Z9889 Other specified postprocedural states: Secondary | ICD-10-CM | POA: Diagnosis not present

## 2013-05-04 DIAGNOSIS — R918 Other nonspecific abnormal finding of lung field: Secondary | ICD-10-CM | POA: Diagnosis not present

## 2013-05-05 DIAGNOSIS — N186 End stage renal disease: Secondary | ICD-10-CM | POA: Diagnosis not present

## 2013-05-05 DIAGNOSIS — N2581 Secondary hyperparathyroidism of renal origin: Secondary | ICD-10-CM | POA: Diagnosis not present

## 2013-05-05 DIAGNOSIS — D509 Iron deficiency anemia, unspecified: Secondary | ICD-10-CM | POA: Diagnosis not present

## 2013-05-08 DIAGNOSIS — D509 Iron deficiency anemia, unspecified: Secondary | ICD-10-CM | POA: Diagnosis not present

## 2013-05-08 DIAGNOSIS — N186 End stage renal disease: Secondary | ICD-10-CM | POA: Diagnosis not present

## 2013-05-08 DIAGNOSIS — N2581 Secondary hyperparathyroidism of renal origin: Secondary | ICD-10-CM | POA: Diagnosis not present

## 2013-05-10 DIAGNOSIS — N186 End stage renal disease: Secondary | ICD-10-CM | POA: Diagnosis not present

## 2013-05-10 DIAGNOSIS — D509 Iron deficiency anemia, unspecified: Secondary | ICD-10-CM | POA: Diagnosis not present

## 2013-05-10 DIAGNOSIS — N2581 Secondary hyperparathyroidism of renal origin: Secondary | ICD-10-CM | POA: Diagnosis not present

## 2013-05-12 DIAGNOSIS — D509 Iron deficiency anemia, unspecified: Secondary | ICD-10-CM | POA: Diagnosis not present

## 2013-05-12 DIAGNOSIS — N2581 Secondary hyperparathyroidism of renal origin: Secondary | ICD-10-CM | POA: Diagnosis not present

## 2013-05-12 DIAGNOSIS — N186 End stage renal disease: Secondary | ICD-10-CM | POA: Diagnosis not present

## 2013-05-15 DIAGNOSIS — D509 Iron deficiency anemia, unspecified: Secondary | ICD-10-CM | POA: Diagnosis not present

## 2013-05-15 DIAGNOSIS — N186 End stage renal disease: Secondary | ICD-10-CM | POA: Diagnosis not present

## 2013-05-15 DIAGNOSIS — N2581 Secondary hyperparathyroidism of renal origin: Secondary | ICD-10-CM | POA: Diagnosis not present

## 2013-05-17 DIAGNOSIS — N2581 Secondary hyperparathyroidism of renal origin: Secondary | ICD-10-CM | POA: Diagnosis not present

## 2013-05-17 DIAGNOSIS — D509 Iron deficiency anemia, unspecified: Secondary | ICD-10-CM | POA: Diagnosis not present

## 2013-05-17 DIAGNOSIS — N186 End stage renal disease: Secondary | ICD-10-CM | POA: Diagnosis not present

## 2013-05-19 DIAGNOSIS — N186 End stage renal disease: Secondary | ICD-10-CM | POA: Diagnosis not present

## 2013-05-19 DIAGNOSIS — N2581 Secondary hyperparathyroidism of renal origin: Secondary | ICD-10-CM | POA: Diagnosis not present

## 2013-05-19 DIAGNOSIS — D509 Iron deficiency anemia, unspecified: Secondary | ICD-10-CM | POA: Diagnosis not present

## 2013-05-22 DIAGNOSIS — N2581 Secondary hyperparathyroidism of renal origin: Secondary | ICD-10-CM | POA: Diagnosis not present

## 2013-05-22 DIAGNOSIS — N186 End stage renal disease: Secondary | ICD-10-CM | POA: Diagnosis not present

## 2013-05-22 DIAGNOSIS — D509 Iron deficiency anemia, unspecified: Secondary | ICD-10-CM | POA: Diagnosis not present

## 2013-05-24 DIAGNOSIS — D509 Iron deficiency anemia, unspecified: Secondary | ICD-10-CM | POA: Diagnosis not present

## 2013-05-24 DIAGNOSIS — N2581 Secondary hyperparathyroidism of renal origin: Secondary | ICD-10-CM | POA: Diagnosis not present

## 2013-05-24 DIAGNOSIS — N186 End stage renal disease: Secondary | ICD-10-CM | POA: Diagnosis not present

## 2013-05-25 DIAGNOSIS — T82898A Other specified complication of vascular prosthetic devices, implants and grafts, initial encounter: Secondary | ICD-10-CM | POA: Diagnosis not present

## 2013-05-25 DIAGNOSIS — N186 End stage renal disease: Secondary | ICD-10-CM | POA: Diagnosis not present

## 2013-05-25 DIAGNOSIS — I871 Compression of vein: Secondary | ICD-10-CM | POA: Diagnosis not present

## 2013-05-26 DIAGNOSIS — D509 Iron deficiency anemia, unspecified: Secondary | ICD-10-CM | POA: Diagnosis not present

## 2013-05-26 DIAGNOSIS — N2581 Secondary hyperparathyroidism of renal origin: Secondary | ICD-10-CM | POA: Diagnosis not present

## 2013-05-26 DIAGNOSIS — N186 End stage renal disease: Secondary | ICD-10-CM | POA: Diagnosis not present

## 2013-05-29 DIAGNOSIS — N186 End stage renal disease: Secondary | ICD-10-CM | POA: Diagnosis not present

## 2013-05-29 DIAGNOSIS — N2581 Secondary hyperparathyroidism of renal origin: Secondary | ICD-10-CM | POA: Diagnosis not present

## 2013-05-29 DIAGNOSIS — D509 Iron deficiency anemia, unspecified: Secondary | ICD-10-CM | POA: Diagnosis not present

## 2013-05-31 DIAGNOSIS — D509 Iron deficiency anemia, unspecified: Secondary | ICD-10-CM | POA: Diagnosis not present

## 2013-05-31 DIAGNOSIS — N186 End stage renal disease: Secondary | ICD-10-CM | POA: Diagnosis not present

## 2013-05-31 DIAGNOSIS — N2581 Secondary hyperparathyroidism of renal origin: Secondary | ICD-10-CM | POA: Diagnosis not present

## 2013-06-02 DIAGNOSIS — D509 Iron deficiency anemia, unspecified: Secondary | ICD-10-CM | POA: Diagnosis not present

## 2013-06-02 DIAGNOSIS — N186 End stage renal disease: Secondary | ICD-10-CM | POA: Diagnosis not present

## 2013-06-02 DIAGNOSIS — N2581 Secondary hyperparathyroidism of renal origin: Secondary | ICD-10-CM | POA: Diagnosis not present

## 2013-06-05 DIAGNOSIS — N2581 Secondary hyperparathyroidism of renal origin: Secondary | ICD-10-CM | POA: Diagnosis not present

## 2013-06-05 DIAGNOSIS — N186 End stage renal disease: Secondary | ICD-10-CM | POA: Diagnosis not present

## 2013-06-05 DIAGNOSIS — D509 Iron deficiency anemia, unspecified: Secondary | ICD-10-CM | POA: Diagnosis not present

## 2013-06-07 DIAGNOSIS — D509 Iron deficiency anemia, unspecified: Secondary | ICD-10-CM | POA: Diagnosis not present

## 2013-06-07 DIAGNOSIS — N186 End stage renal disease: Secondary | ICD-10-CM | POA: Diagnosis not present

## 2013-06-07 DIAGNOSIS — N2581 Secondary hyperparathyroidism of renal origin: Secondary | ICD-10-CM | POA: Diagnosis not present

## 2013-06-09 DIAGNOSIS — D509 Iron deficiency anemia, unspecified: Secondary | ICD-10-CM | POA: Diagnosis not present

## 2013-06-09 DIAGNOSIS — N2581 Secondary hyperparathyroidism of renal origin: Secondary | ICD-10-CM | POA: Diagnosis not present

## 2013-06-09 DIAGNOSIS — N186 End stage renal disease: Secondary | ICD-10-CM | POA: Diagnosis not present

## 2013-06-12 DIAGNOSIS — N2581 Secondary hyperparathyroidism of renal origin: Secondary | ICD-10-CM | POA: Diagnosis not present

## 2013-06-12 DIAGNOSIS — N186 End stage renal disease: Secondary | ICD-10-CM | POA: Diagnosis not present

## 2013-06-12 DIAGNOSIS — D509 Iron deficiency anemia, unspecified: Secondary | ICD-10-CM | POA: Diagnosis not present

## 2013-06-14 DIAGNOSIS — N186 End stage renal disease: Secondary | ICD-10-CM | POA: Diagnosis not present

## 2013-06-14 DIAGNOSIS — D509 Iron deficiency anemia, unspecified: Secondary | ICD-10-CM | POA: Diagnosis not present

## 2013-06-14 DIAGNOSIS — N2581 Secondary hyperparathyroidism of renal origin: Secondary | ICD-10-CM | POA: Diagnosis not present

## 2013-06-16 DIAGNOSIS — D509 Iron deficiency anemia, unspecified: Secondary | ICD-10-CM | POA: Diagnosis not present

## 2013-06-16 DIAGNOSIS — N186 End stage renal disease: Secondary | ICD-10-CM | POA: Diagnosis not present

## 2013-06-16 DIAGNOSIS — N2581 Secondary hyperparathyroidism of renal origin: Secondary | ICD-10-CM | POA: Diagnosis not present

## 2013-06-19 DIAGNOSIS — N2581 Secondary hyperparathyroidism of renal origin: Secondary | ICD-10-CM | POA: Diagnosis not present

## 2013-06-19 DIAGNOSIS — N186 End stage renal disease: Secondary | ICD-10-CM | POA: Diagnosis not present

## 2013-06-19 DIAGNOSIS — D509 Iron deficiency anemia, unspecified: Secondary | ICD-10-CM | POA: Diagnosis not present

## 2013-06-21 DIAGNOSIS — N2581 Secondary hyperparathyroidism of renal origin: Secondary | ICD-10-CM | POA: Diagnosis not present

## 2013-06-21 DIAGNOSIS — N186 End stage renal disease: Secondary | ICD-10-CM | POA: Diagnosis not present

## 2013-06-21 DIAGNOSIS — D509 Iron deficiency anemia, unspecified: Secondary | ICD-10-CM | POA: Diagnosis not present

## 2013-06-23 DIAGNOSIS — D509 Iron deficiency anemia, unspecified: Secondary | ICD-10-CM | POA: Diagnosis not present

## 2013-06-23 DIAGNOSIS — N2581 Secondary hyperparathyroidism of renal origin: Secondary | ICD-10-CM | POA: Diagnosis not present

## 2013-06-23 DIAGNOSIS — N186 End stage renal disease: Secondary | ICD-10-CM | POA: Diagnosis not present

## 2013-06-25 DIAGNOSIS — N186 End stage renal disease: Secondary | ICD-10-CM | POA: Diagnosis not present

## 2013-06-26 DIAGNOSIS — D509 Iron deficiency anemia, unspecified: Secondary | ICD-10-CM | POA: Diagnosis not present

## 2013-06-26 DIAGNOSIS — N2581 Secondary hyperparathyroidism of renal origin: Secondary | ICD-10-CM | POA: Diagnosis not present

## 2013-06-26 DIAGNOSIS — N186 End stage renal disease: Secondary | ICD-10-CM | POA: Diagnosis not present

## 2013-06-28 DIAGNOSIS — N2581 Secondary hyperparathyroidism of renal origin: Secondary | ICD-10-CM | POA: Diagnosis not present

## 2013-06-28 DIAGNOSIS — D509 Iron deficiency anemia, unspecified: Secondary | ICD-10-CM | POA: Diagnosis not present

## 2013-06-28 DIAGNOSIS — N186 End stage renal disease: Secondary | ICD-10-CM | POA: Diagnosis not present

## 2013-06-30 DIAGNOSIS — D509 Iron deficiency anemia, unspecified: Secondary | ICD-10-CM | POA: Diagnosis not present

## 2013-06-30 DIAGNOSIS — N2581 Secondary hyperparathyroidism of renal origin: Secondary | ICD-10-CM | POA: Diagnosis not present

## 2013-06-30 DIAGNOSIS — N186 End stage renal disease: Secondary | ICD-10-CM | POA: Diagnosis not present

## 2013-07-03 DIAGNOSIS — N186 End stage renal disease: Secondary | ICD-10-CM | POA: Diagnosis not present

## 2013-07-03 DIAGNOSIS — D509 Iron deficiency anemia, unspecified: Secondary | ICD-10-CM | POA: Diagnosis not present

## 2013-07-03 DIAGNOSIS — N2581 Secondary hyperparathyroidism of renal origin: Secondary | ICD-10-CM | POA: Diagnosis not present

## 2013-07-05 DIAGNOSIS — D509 Iron deficiency anemia, unspecified: Secondary | ICD-10-CM | POA: Diagnosis not present

## 2013-07-05 DIAGNOSIS — N186 End stage renal disease: Secondary | ICD-10-CM | POA: Diagnosis not present

## 2013-07-05 DIAGNOSIS — N2581 Secondary hyperparathyroidism of renal origin: Secondary | ICD-10-CM | POA: Diagnosis not present

## 2013-07-07 DIAGNOSIS — N2581 Secondary hyperparathyroidism of renal origin: Secondary | ICD-10-CM | POA: Diagnosis not present

## 2013-07-07 DIAGNOSIS — N186 End stage renal disease: Secondary | ICD-10-CM | POA: Diagnosis not present

## 2013-07-07 DIAGNOSIS — D509 Iron deficiency anemia, unspecified: Secondary | ICD-10-CM | POA: Diagnosis not present

## 2013-07-10 DIAGNOSIS — N186 End stage renal disease: Secondary | ICD-10-CM | POA: Diagnosis not present

## 2013-07-10 DIAGNOSIS — D509 Iron deficiency anemia, unspecified: Secondary | ICD-10-CM | POA: Diagnosis not present

## 2013-07-10 DIAGNOSIS — N2581 Secondary hyperparathyroidism of renal origin: Secondary | ICD-10-CM | POA: Diagnosis not present

## 2013-07-12 DIAGNOSIS — N2581 Secondary hyperparathyroidism of renal origin: Secondary | ICD-10-CM | POA: Diagnosis not present

## 2013-07-12 DIAGNOSIS — D509 Iron deficiency anemia, unspecified: Secondary | ICD-10-CM | POA: Diagnosis not present

## 2013-07-12 DIAGNOSIS — N186 End stage renal disease: Secondary | ICD-10-CM | POA: Diagnosis not present

## 2013-07-14 DIAGNOSIS — N186 End stage renal disease: Secondary | ICD-10-CM | POA: Diagnosis not present

## 2013-07-14 DIAGNOSIS — N2581 Secondary hyperparathyroidism of renal origin: Secondary | ICD-10-CM | POA: Diagnosis not present

## 2013-07-14 DIAGNOSIS — D509 Iron deficiency anemia, unspecified: Secondary | ICD-10-CM | POA: Diagnosis not present

## 2013-07-17 DIAGNOSIS — D509 Iron deficiency anemia, unspecified: Secondary | ICD-10-CM | POA: Diagnosis not present

## 2013-07-17 DIAGNOSIS — N186 End stage renal disease: Secondary | ICD-10-CM | POA: Diagnosis not present

## 2013-07-17 DIAGNOSIS — N2581 Secondary hyperparathyroidism of renal origin: Secondary | ICD-10-CM | POA: Diagnosis not present

## 2013-07-19 DIAGNOSIS — N186 End stage renal disease: Secondary | ICD-10-CM | POA: Diagnosis not present

## 2013-07-19 DIAGNOSIS — N2581 Secondary hyperparathyroidism of renal origin: Secondary | ICD-10-CM | POA: Diagnosis not present

## 2013-07-19 DIAGNOSIS — D509 Iron deficiency anemia, unspecified: Secondary | ICD-10-CM | POA: Diagnosis not present

## 2013-07-21 DIAGNOSIS — N186 End stage renal disease: Secondary | ICD-10-CM | POA: Diagnosis not present

## 2013-07-21 DIAGNOSIS — N2581 Secondary hyperparathyroidism of renal origin: Secondary | ICD-10-CM | POA: Diagnosis not present

## 2013-07-21 DIAGNOSIS — D509 Iron deficiency anemia, unspecified: Secondary | ICD-10-CM | POA: Diagnosis not present

## 2013-07-24 DIAGNOSIS — D509 Iron deficiency anemia, unspecified: Secondary | ICD-10-CM | POA: Diagnosis not present

## 2013-07-24 DIAGNOSIS — N2581 Secondary hyperparathyroidism of renal origin: Secondary | ICD-10-CM | POA: Diagnosis not present

## 2013-07-24 DIAGNOSIS — N186 End stage renal disease: Secondary | ICD-10-CM | POA: Diagnosis not present

## 2013-07-25 DIAGNOSIS — N186 End stage renal disease: Secondary | ICD-10-CM | POA: Diagnosis not present

## 2013-07-26 DIAGNOSIS — D509 Iron deficiency anemia, unspecified: Secondary | ICD-10-CM | POA: Diagnosis not present

## 2013-07-26 DIAGNOSIS — N2581 Secondary hyperparathyroidism of renal origin: Secondary | ICD-10-CM | POA: Diagnosis not present

## 2013-07-26 DIAGNOSIS — N186 End stage renal disease: Secondary | ICD-10-CM | POA: Diagnosis not present

## 2013-08-02 DIAGNOSIS — N186 End stage renal disease: Secondary | ICD-10-CM | POA: Diagnosis not present

## 2013-08-03 ENCOUNTER — Ambulatory Visit (INDEPENDENT_AMBULATORY_CARE_PROVIDER_SITE_OTHER): Payer: Medicare Other | Admitting: Cardiology

## 2013-08-03 ENCOUNTER — Encounter: Payer: Self-pay | Admitting: Cardiology

## 2013-08-03 VITALS — BP 150/100 | HR 84 | Ht 66.0 in | Wt 190.0 lb

## 2013-08-03 DIAGNOSIS — I2510 Atherosclerotic heart disease of native coronary artery without angina pectoris: Secondary | ICD-10-CM | POA: Diagnosis not present

## 2013-08-03 DIAGNOSIS — R072 Precordial pain: Secondary | ICD-10-CM | POA: Diagnosis not present

## 2013-08-03 DIAGNOSIS — N186 End stage renal disease: Secondary | ICD-10-CM

## 2013-08-03 DIAGNOSIS — I10 Essential (primary) hypertension: Secondary | ICD-10-CM

## 2013-08-03 DIAGNOSIS — I25709 Atherosclerosis of coronary artery bypass graft(s), unspecified, with unspecified angina pectoris: Secondary | ICD-10-CM

## 2013-08-03 DIAGNOSIS — M549 Dorsalgia, unspecified: Secondary | ICD-10-CM | POA: Diagnosis not present

## 2013-08-03 DIAGNOSIS — E782 Mixed hyperlipidemia: Secondary | ICD-10-CM | POA: Diagnosis not present

## 2013-08-03 DIAGNOSIS — Z992 Dependence on renal dialysis: Secondary | ICD-10-CM

## 2013-08-03 DIAGNOSIS — I209 Angina pectoris, unspecified: Secondary | ICD-10-CM

## 2013-08-03 DIAGNOSIS — I25119 Atherosclerotic heart disease of native coronary artery with unspecified angina pectoris: Secondary | ICD-10-CM

## 2013-08-03 MED ORDER — CARVEDILOL 3.125 MG PO TABS: 3.1250 mg | ORAL_TABLET | Freq: Two times a day (BID) | ORAL | Status: DC

## 2013-08-03 NOTE — Assessment & Plan Note (Signed)
It sounds like this has been difficult to manage over the years and has been complicated by medication noncompliance. Dr. Kristian CoveyBefekadu is following blood pressure regularly with hemodialysis sessions, and the patient also sees Dr. Renard MatterMcInnis. Low-dose Coreg is being added. May need additional adjustments.

## 2013-08-03 NOTE — Progress Notes (Addendum)
Clinical Summary [name] is a 43 y.o.male referred for cardiology consultation by Dr. Kristian Alexander. Available records reviewed. He was seen by our practice in the past, last by Dr. Eden Alexander. History includes poorly controlled hypertension, complicated by medication noncompliance, progressive renal disease to ESRD on hemodialysis, and previously documented hypertensive heart disease with negative noninvasive ischemic workup. He also had prior evidence of apparent type II NSTEMI that was managed medically. I see that in July 2013 he [facility] following abnormal noninvasive workup as part of a renal transplantation evaluation. He underwent LIMA to LAD, SVG to circumflex, and SVG to ramus intermedius, LVEF was 70% at that point.   He presents today stating that occasionally he feels a discomfort in his chest during hemodialysis sessions, usually lasts from 5-10 minutes, and does not require any specific intervention. He states that this occurs sometimes when they are "pulling too hard." He does not report any regular exertional chest pain.  ECG today shows normal sinus rhythm with LVH and repolarization abnormalities. He has had no followup ischemic testing since bypass surgery.  Lab work from June showed hemoglobin 12.2, potassium 3.7, BUN 47, creatinine 8.6, normal LFTs.  He did bring in his present medication bottles. Previous medical regimens were more robust based on record review.  No Known Allergies  Current Outpatient Prescriptions  Medication Sig Dispense Refill  . aspirin 81 MG tablet Take 81 mg by mouth daily.      . carvedilol (COREG) 3.125 MG tablet Take 1 tablet (3.125 mg total) by mouth 2 (two) times daily.  180 tablet  3  . cloNIDine (CATAPRES) 0.2 MG tablet Take 0.2 mg by mouth 2 (two) times daily.      . hydrALAZINE (APRESOLINE) 50 MG tablet Take 50 mg by mouth 2 (two) times daily.      . multivitamin (RENA-VIT) TABS tablet Take 1 tablet by mouth daily.         No  current facility-administered medications for this visit.    Past Medical History  Diagnosis Date  . Essential hypertension, benign   . Mixed hyperlipidemia   . ESRD on hemodialysis   . History of medication noncompliance   . Hypertensive heart disease   . History of stroke 2012    No ASD, PFO, SOE by TEE 01/2010  . Coronary atherosclerosis of native coronary artery     [facility] 07[facility], LVEF 70%    Past Surgical History  Procedure Laterality Date  . Insertion of dialysis catheter  01/02/2011    Procedure: INSERTION OF DIALYSIS CATHETER;  Surgeon: Mario OchoaJames D Lawson, MD;  [facility];  Service: Vascular;  Laterality: N/A;  Insertion of Dialysis catheter Right Internal Jugular with 24cm dialysis catheter  . Coronary artery bypass graft  07/2011    [facility] - LIMA to LAD, SVG to circumflex, SVG to ramus intermedius    Family History  Problem Relation Age of Onset  . Diabetes Mother   . Hypertension Mother   . Hyperlipidemia Mother   . COPD Father   . Lung cancer Father     Social History [name] reports that he has never smoked. He does not have any smokeless tobacco history on file. [name] reports that he does not drink alcohol.  Review of Systems No palpitations or syncope. No spontaneous bleeding problems. Reports no access site problems with hemodialysis. Stable appetite. No melena or hematochezia. Other systems reviewed and negative.  Physical Examination Filed  Vitals:   08/03/13 0835  BP: 150/100  Pulse: 84   Filed Weights   08/03/13 0835  Weight: 190 lb (86.183 kg)   Patient appears comfortable at rest. HEENT: Conjunctiva and lids normal, oropharynx clear. Neck: Supple, no elevated JVP or carotid bruits, no thyromegaly. Lungs: Clear to auscultation, nonlabored breathing at rest. Cardiac: Regular rate and rhythm, no S3, 2/6 systolic murmur, no pericardial rub. Abdomen: Soft, nontender, bowel sounds present, no guarding or  rebound. Extremities: No pitting edema, distal pulses 2+. Left forearm AV fistula with palpable thrill. Skin: Warm and dry. Musculoskeletal: No kyphosis. Neuropsychiatric: Alert and oriented x3, affect grossly appropriate.   Problem List and Plan   Precordial pain Reports this intermittently, 5-10 minutes at a time, usually with hemodialysis sessions. This is not recurring with any regularity however. Could be anginal chest pain with apparent diagnosis of ischemic heart disease.  Coronary atherosclerosis of native coronary artery Records reviewed from [facility]. Patient underwent CABG with LIMA to LAD, SVG to circumflex, and SVG to ramus intermedius in July 2013. His ECG shows LVH with repolarization abnormalities. He has had no followup ischemic testing. I have asked him to start on aspirin 81 mg daily, continue current regimen, and Coreg 3.125 mg twice daily. We will obtain an echocardiogram to assess cardiac structure and function, and a Lexiscan Cardiolite for assessment of ischemic burden. Office followup arranged to review.  Essential hypertension, benign It sounds like this has been difficult to manage over the years and has been complicated by medication noncompliance. Dr. Kristian Covey is following blood pressure regularly with hemodialysis sessions, and the patient also sees Dr. Renard Alexander. Low-dose Coreg is being added. May need additional adjustments.  ESRD on hemodialysis Followed by Dr. Kristian Covey.  Mixed hyperlipidemia Previously on Zocor. Will discuss resuming statin therapy as we continue to modify his medical regimen.    Mario Alexander, M.D., F.A.C.C.

## 2013-08-03 NOTE — Patient Instructions (Addendum)
Your physician recommends that you schedule a follow-up appointment in: 1 month   Your physician has recommended you make the following change in your medication:     START Aspirin 81 mg daily   START Coreg 3.125 mg twice a day     Your physician has requested that you have a lexiscan myoview. For further information please visit [email or web address]. Please follow instruction sheet, as given.    Your physician has requested that you have an echocardiogram. Echocardiography is a painless test that uses sound waves to create images of your heart. It provides your doctor with information about the size and shape of your heart and how well your heart's chambers and valves are working. This procedure takes approximately one hour. There are no restrictions for this procedure.

## 2013-08-03 NOTE — Assessment & Plan Note (Addendum)
[facility]. Patient underwent CABG with LIMA to LAD, SVG to circumflex, and SVG to ramus intermedius in July 2013. His ECG shows LVH with repolarization abnormalities. He has had no followup ischemic testing. I have asked him to start on aspirin 81 mg daily, continue current regimen, and Coreg 3.125 mg twice daily. We will obtain an echocardiogram to assess cardiac structure and function, and a Lexiscan Cardiolite for assessment of ischemic burden. Office followup arranged to review.

## 2013-08-03 NOTE — Assessment & Plan Note (Signed)
Followed by Dr. Befekadu. 

## 2013-08-03 NOTE — Assessment & Plan Note (Signed)
Previously on Zocor. Will discuss resuming statin therapy as we continue to modify his medical regimen.

## 2013-08-03 NOTE — Assessment & Plan Note (Signed)
Reports this intermittently, 5-10 minutes at a time, usually with hemodialysis sessions. This is not recurring with any regularity however. Could be anginal chest pain with apparent diagnosis of ischemic heart disease.

## 2013-08-10 ENCOUNTER — Encounter (HOSPITAL_COMMUNITY): Payer: Self-pay

## 2013-08-10 ENCOUNTER — Ambulatory Visit (HOSPITAL_COMMUNITY)
Admission: RE | Admit: 2013-08-10 | Discharge: 2013-08-10 | Disposition: A | Discharge status: Home / Self Care | Payer: Medicare Other | Source: Ambulatory Visit | Attending: Cardiology | Admitting: Cardiology

## 2013-08-10 ENCOUNTER — Ambulatory Visit (HOSPITAL_COMMUNITY): Payer: Self-pay

## 2013-08-10 ENCOUNTER — Encounter (HOSPITAL_COMMUNITY)
Admission: RE | Admit: 2013-08-10 | Discharge: 2013-08-10 | Disposition: A | Discharge status: Home / Self Care | Payer: Medicare Other | Source: Ambulatory Visit | Attending: Cardiology | Admitting: Cardiology

## 2013-08-10 DIAGNOSIS — I25810 Atherosclerosis of coronary artery bypass graft(s) without angina pectoris: Secondary | ICD-10-CM | POA: Diagnosis not present

## 2013-08-10 DIAGNOSIS — I25709 Atherosclerosis of coronary artery bypass graft(s), unspecified, with unspecified angina pectoris: Secondary | ICD-10-CM

## 2013-08-10 DIAGNOSIS — Z992 Dependence on renal dialysis: Secondary | ICD-10-CM | POA: Insufficient documentation

## 2013-08-10 DIAGNOSIS — R072 Precordial pain: Secondary | ICD-10-CM

## 2013-08-10 DIAGNOSIS — E785 Hyperlipidemia, unspecified: Secondary | ICD-10-CM | POA: Insufficient documentation

## 2013-08-10 DIAGNOSIS — I252 Old myocardial infarction: Secondary | ICD-10-CM | POA: Diagnosis not present

## 2013-08-10 DIAGNOSIS — I2510 Atherosclerotic heart disease of native coronary artery without angina pectoris: Secondary | ICD-10-CM | POA: Diagnosis not present

## 2013-08-10 DIAGNOSIS — R079 Chest pain, unspecified: Secondary | ICD-10-CM | POA: Diagnosis not present

## 2013-08-10 DIAGNOSIS — I129 Hypertensive chronic kidney disease with stage 1 through stage 4 chronic kidney disease, or unspecified chronic kidney disease: Secondary | ICD-10-CM | POA: Insufficient documentation

## 2013-08-10 DIAGNOSIS — I369 Nonrheumatic tricuspid valve disorder, unspecified: Secondary | ICD-10-CM | POA: Diagnosis not present

## 2013-08-10 DIAGNOSIS — Z8673 Personal history of transient ischemic attack (TIA), and cerebral infarction without residual deficits: Secondary | ICD-10-CM | POA: Insufficient documentation

## 2013-08-10 DIAGNOSIS — I209 Angina pectoris, unspecified: Secondary | ICD-10-CM | POA: Diagnosis not present

## 2013-08-10 DIAGNOSIS — N189 Chronic kidney disease, unspecified: Secondary | ICD-10-CM | POA: Insufficient documentation

## 2013-08-10 MED ORDER — TECHNETIUM TC 99M SESTAMIBI GENERIC - CARDIOLITE
10.0000 | Freq: Once | INTRAVENOUS | Status: AC | PRN
  Administered 2013-08-10: 10 via INTRAVENOUS

## 2013-08-10 MED ORDER — REGADENOSON 0.4 MG/5ML IV SOLN
0.4000 mg | Freq: Once | INTRAVENOUS | Status: AC | PRN
  Administered 2013-08-10: 0.4 mg via INTRAVENOUS

## 2013-08-10 MED ORDER — SODIUM CHLORIDE 0.9 % IJ SOLN
INTRAMUSCULAR | Status: AC
  Administered 2013-08-10: 10 mL via INTRAVENOUS
  Filled 2013-08-10: qty 10

## 2013-08-10 MED ORDER — REGADENOSON 0.4 MG/5ML IV SOLN
INTRAVENOUS | Status: AC
  Administered 2013-08-10: 0.4 mg via INTRAVENOUS
  Filled 2013-08-10: qty 5

## 2013-08-10 MED ORDER — SODIUM CHLORIDE 0.9 % IJ SOLN
10.0000 mL | INTRAMUSCULAR | Status: DC | PRN
  Administered 2013-08-10: 10 mL via INTRAVENOUS

## 2013-08-10 MED ORDER — TECHNETIUM TC 99M SESTAMIBI - CARDIOLITE
30.0000 | Freq: Once | INTRAVENOUS | Status: AC | PRN
  Administered 2013-08-10: 31 via INTRAVENOUS

## 2013-08-10 NOTE — Progress Notes (Signed)
  Echocardiogram 2D Echocardiogram has been performed.  Mario Alexander 08/10/2013, 8:48 AM

## 2013-08-10 NOTE — Progress Notes (Signed)
Stress Lab Nurses Notes - Mario Alexander  Mario Alexander 08/10/2013 Reason for doing test: CAD and precordial pain Type of test: Marlane HatcherLexiscan Cardiolite Nurse performing test: Parke PoissonPhyllis Billingsly, RN Nuclear Medicine Tech: Lyndel Pleasureyan Liles Echo Tech: Not Applicable MD performing test: Branch/K.Lawrence NP Family MD: McInnis/Befekadu Test explained and consent signed: Yes.   IV started: 22g jelco, Saline lock flushed, No redness or edema and Saline lock started in radiology Symptoms: stomach discomfort & nausea Treatment/Intervention: None Reason test stopped: protocol completed After recovery IV was: Discontinued via X-ray tech and No redness or edema Patient to return to Nuc. Med at : 11:45 Patient discharged: Home Patient's Condition upon discharge was: stable Comments: During test BP 179/84 & HR 126.  Recovery BP 150/825 & HR 85.  Symptoms resolved in recovery. Erskine SpeedBillingsley, Jewelia Bocchino T

## 2013-08-25 DIAGNOSIS — N186 End stage renal disease: Secondary | ICD-10-CM | POA: Diagnosis not present

## 2013-08-28 DIAGNOSIS — D509 Iron deficiency anemia, unspecified: Secondary | ICD-10-CM | POA: Diagnosis not present

## 2013-08-28 DIAGNOSIS — N2581 Secondary hyperparathyroidism of renal origin: Secondary | ICD-10-CM | POA: Diagnosis not present

## 2013-08-28 DIAGNOSIS — N186 End stage renal disease: Secondary | ICD-10-CM | POA: Diagnosis not present

## 2013-08-30 DIAGNOSIS — N2581 Secondary hyperparathyroidism of renal origin: Secondary | ICD-10-CM | POA: Diagnosis not present

## 2013-08-30 DIAGNOSIS — D509 Iron deficiency anemia, unspecified: Secondary | ICD-10-CM | POA: Diagnosis not present

## 2013-08-30 DIAGNOSIS — N186 End stage renal disease: Secondary | ICD-10-CM | POA: Diagnosis not present

## 2013-09-01 DIAGNOSIS — N186 End stage renal disease: Secondary | ICD-10-CM | POA: Diagnosis not present

## 2013-09-01 DIAGNOSIS — N2581 Secondary hyperparathyroidism of renal origin: Secondary | ICD-10-CM | POA: Diagnosis not present

## 2013-09-01 DIAGNOSIS — D509 Iron deficiency anemia, unspecified: Secondary | ICD-10-CM | POA: Diagnosis not present

## 2013-09-04 DIAGNOSIS — N186 End stage renal disease: Secondary | ICD-10-CM | POA: Diagnosis not present

## 2013-09-04 DIAGNOSIS — D509 Iron deficiency anemia, unspecified: Secondary | ICD-10-CM | POA: Diagnosis not present

## 2013-09-04 DIAGNOSIS — N2581 Secondary hyperparathyroidism of renal origin: Secondary | ICD-10-CM | POA: Diagnosis not present

## 2013-09-06 DIAGNOSIS — N2581 Secondary hyperparathyroidism of renal origin: Secondary | ICD-10-CM | POA: Diagnosis not present

## 2013-09-06 DIAGNOSIS — N186 End stage renal disease: Secondary | ICD-10-CM | POA: Diagnosis not present

## 2013-09-06 DIAGNOSIS — D509 Iron deficiency anemia, unspecified: Secondary | ICD-10-CM | POA: Diagnosis not present

## 2013-09-08 DIAGNOSIS — D509 Iron deficiency anemia, unspecified: Secondary | ICD-10-CM | POA: Diagnosis not present

## 2013-09-08 DIAGNOSIS — N186 End stage renal disease: Secondary | ICD-10-CM | POA: Diagnosis not present

## 2013-09-08 DIAGNOSIS — N2581 Secondary hyperparathyroidism of renal origin: Secondary | ICD-10-CM | POA: Diagnosis not present

## 2013-09-11 DIAGNOSIS — N2581 Secondary hyperparathyroidism of renal origin: Secondary | ICD-10-CM | POA: Diagnosis not present

## 2013-09-11 DIAGNOSIS — D509 Iron deficiency anemia, unspecified: Secondary | ICD-10-CM | POA: Diagnosis not present

## 2013-09-11 DIAGNOSIS — N186 End stage renal disease: Secondary | ICD-10-CM | POA: Diagnosis not present

## 2013-09-13 DIAGNOSIS — D509 Iron deficiency anemia, unspecified: Secondary | ICD-10-CM | POA: Diagnosis not present

## 2013-09-13 DIAGNOSIS — N2581 Secondary hyperparathyroidism of renal origin: Secondary | ICD-10-CM | POA: Diagnosis not present

## 2013-09-13 DIAGNOSIS — N186 End stage renal disease: Secondary | ICD-10-CM | POA: Diagnosis not present

## 2013-09-15 DIAGNOSIS — N186 End stage renal disease: Secondary | ICD-10-CM | POA: Diagnosis not present

## 2013-09-15 DIAGNOSIS — D509 Iron deficiency anemia, unspecified: Secondary | ICD-10-CM | POA: Diagnosis not present

## 2013-09-15 DIAGNOSIS — N2581 Secondary hyperparathyroidism of renal origin: Secondary | ICD-10-CM | POA: Diagnosis not present

## 2013-09-18 ENCOUNTER — Encounter (HOSPITAL_COMMUNITY): Payer: Self-pay | Admitting: Emergency Medicine

## 2013-09-18 ENCOUNTER — Emergency Department (HOSPITAL_COMMUNITY)
Admission: EM | Admit: 2013-09-18 | Discharge: 2013-09-18 | Disposition: A | Discharge status: Home / Self Care | Payer: Medicare Other | Attending: Emergency Medicine | Admitting: Emergency Medicine

## 2013-09-18 DIAGNOSIS — I10 Essential (primary) hypertension: Secondary | ICD-10-CM | POA: Diagnosis not present

## 2013-09-18 DIAGNOSIS — N186 End stage renal disease: Secondary | ICD-10-CM | POA: Insufficient documentation

## 2013-09-18 DIAGNOSIS — Z8673 Personal history of transient ischemic attack (TIA), and cerebral infarction without residual deficits: Secondary | ICD-10-CM | POA: Insufficient documentation

## 2013-09-18 DIAGNOSIS — I120 Hypertensive chronic kidney disease with stage 5 chronic kidney disease or end stage renal disease: Secondary | ICD-10-CM | POA: Insufficient documentation

## 2013-09-18 DIAGNOSIS — N2581 Secondary hyperparathyroidism of renal origin: Secondary | ICD-10-CM | POA: Diagnosis not present

## 2013-09-18 DIAGNOSIS — I119 Hypertensive heart disease without heart failure: Secondary | ICD-10-CM | POA: Diagnosis not present

## 2013-09-18 DIAGNOSIS — Z79899 Other long term (current) drug therapy: Secondary | ICD-10-CM | POA: Insufficient documentation

## 2013-09-18 DIAGNOSIS — Z992 Dependence on renal dialysis: Secondary | ICD-10-CM | POA: Diagnosis not present

## 2013-09-18 DIAGNOSIS — I2510 Atherosclerotic heart disease of native coronary artery without angina pectoris: Secondary | ICD-10-CM | POA: Insufficient documentation

## 2013-09-18 DIAGNOSIS — Z8639 Personal history of other endocrine, nutritional and metabolic disease: Secondary | ICD-10-CM | POA: Insufficient documentation

## 2013-09-18 DIAGNOSIS — Z862 Personal history of diseases of the blood and blood-forming organs and certain disorders involving the immune mechanism: Secondary | ICD-10-CM | POA: Insufficient documentation

## 2013-09-18 DIAGNOSIS — H1030 Unspecified acute conjunctivitis, unspecified eye: Secondary | ICD-10-CM | POA: Diagnosis not present

## 2013-09-18 DIAGNOSIS — H109 Unspecified conjunctivitis: Secondary | ICD-10-CM | POA: Diagnosis not present

## 2013-09-18 DIAGNOSIS — Z7982 Long term (current) use of aspirin: Secondary | ICD-10-CM | POA: Insufficient documentation

## 2013-09-18 DIAGNOSIS — Z951 Presence of aortocoronary bypass graft: Secondary | ICD-10-CM | POA: Insufficient documentation

## 2013-09-18 DIAGNOSIS — H5710 Ocular pain, unspecified eye: Secondary | ICD-10-CM | POA: Diagnosis not present

## 2013-09-18 DIAGNOSIS — D509 Iron deficiency anemia, unspecified: Secondary | ICD-10-CM | POA: Diagnosis not present

## 2013-09-18 MED ORDER — FLUORESCEIN SODIUM 1 MG OP STRP
1.0000 | ORAL_STRIP | Freq: Once | OPHTHALMIC | Status: AC
  Administered 2013-09-18: 1 via OPHTHALMIC
  Filled 2013-09-18: qty 1

## 2013-09-18 MED ORDER — ERYTHROMYCIN 5 MG/GM OP OINT
TOPICAL_OINTMENT | Freq: Once | OPHTHALMIC | Status: AC
  Administered 2013-09-18: 13:00:00 via OPHTHALMIC
  Filled 2013-09-18: qty 3.5

## 2013-09-18 MED ORDER — TETRACAINE HCL 0.5 % OP SOLN
1.0000 [drp] | Freq: Once | OPHTHALMIC | Status: AC
  Administered 2013-09-18: 1 [drp] via OPHTHALMIC
  Filled 2013-09-18: qty 2

## 2013-09-18 NOTE — ED Provider Notes (Signed)
Medical screening examination/treatment/procedure(s) were performed by non-physician practitioner and as supervising physician I was immediately available for consultation/collaboration.   EKG Interpretation None        Shon Baton, MD 09/18/13 1946

## 2013-09-18 NOTE — Discharge Instructions (Signed)
Use the eye ointment 3 times a day for the next 5 to 7 days. Follow up with Dr. Lita Mains is symptoms persist.

## 2013-09-18 NOTE — ED Provider Notes (Signed)
CSN: [id number]     Arrival date & time 09/18/13  1147 History  This chart was scribed for [facility]. [place], [place], working with Shon Baton, MD found by Elon Spanner, ED Scribe. This patient was seen in room APFT23/APFT23 and the patient's care was started at 12:24 PM.    Chief Complaint  Patient presents with  . Eye Drainage   Patient is a 43 y.o. male presenting with conjunctivitis. The history is provided by the patient. No language interpreter was used.  Conjunctivitis This is a new problem. The current episode started in the past 7 days. The problem occurs constantly. The problem has been gradually worsening. Pertinent negatives include no visual change. He has tried nothing for the symptoms.    HPI Comments: Mario Alexander is a 43 y.o. male with a history of HTN who presents to the Emergency Department complaining of clear, wet right eye discharge onset 2 days ago with associated itching  Patient reports he was outside under a tree when the itching began.  Patient reports a previous corneal abrasion.  Patient receives dialysis 3 times/week for an unknown period of time. with his most recent appointment today.  Patient denies pain, injury, vision changes.  Past Medical History  Diagnosis Date  . Essential hypertension, benign   . Mixed hyperlipidemia   . ESRD on hemodialysis   . History of medication noncompliance   . Hypertensive heart disease   . History of stroke 2012    No ASD, PFO, SOE by TEE 01/2010  . Coronary atherosclerosis of native coronary artery     Multivessel status post CABG at [facility] 07/2011, LVEF 70%   Past Surgical History  Procedure Laterality Date  . Insertion of dialysis catheter  01/02/2011    Procedure: INSERTION OF DIALYSIS CATHETER;  Surgeon: Pryor Ochoa, MD;  Location: [facility] OR;  Service: Vascular;  Laterality: N/A;  Insertion of Dialysis catheter Right Internal Jugular with 24cm dialysis catheter  . Coronary artery bypass graft  07/2011    [facility] - LIMA to LAD,  SVG to circumflex, SVG to ramus intermedius   Family History  Problem Relation Age of Onset  . Diabetes Mother   . Hypertension Mother   . Hyperlipidemia Mother   . COPD Father   . Lung cancer Father    History  Substance Use Topics  . Smoking status: Never Smoker   . Smokeless tobacco: Not on file  . Alcohol Use: No    Review of Systems  Eyes: Positive for discharge, redness and itching. Negative for pain and visual disturbance.  All other systems reviewed and are negative.     Allergies  Review of patient's allergies indicates no known allergies.  Home Medications   Prior to Admission medications   Medication Sig Start Date End Date Taking? Authorizing Provider  aspirin 81 MG tablet Take 81 mg by mouth daily.    Historical Provider, MD  carvedilol (COREG) 3.125 MG tablet Take 1 tablet (3.125 mg total) by mouth 2 (two) times daily. 08/03/13   Jonelle Sidle, MD  cloNIDine (CATAPRES) 0.2 MG tablet Take 0.2 mg by mouth 2 (two) times daily.    Historical Provider, MD  hydrALAZINE (APRESOLINE) 50 MG tablet Take 50 mg by mouth 2 (two) times daily.    Historical Provider, MD  multivitamin (RENA-VIT) TABS tablet Take 1 tablet by mouth daily.      Historical Provider, MD   BP 137/103  Pulse 80  Temp(Src) 98 F (36.7  C) (Oral)  Resp 18  Ht  (1.676 m)  Wt 190 lb (86.183 kg)  BMI 30.68 kg/m2  SpO2 100% Physical Exam  Nursing note and vitals reviewed. Constitutional: He is oriented to person, place, and time. He appears well-developed and well-nourished. No distress.  HENT:  Head: Normocephalic and atraumatic.  Eyes: EOM are normal. Pupils are equal, round, and reactive to light. Right eye exhibits discharge. Right eye exhibits no hordeolum. No foreign body present in the right eye. Right conjunctiva is injected.  Fundoscopic exam:      The right eye shows no hemorrhage.  Slit lamp exam:      The right eye shows no corneal abrasion, no corneal ulcer, no foreign body  and no fluorescein uptake.  Crusty drainage to right eye.   Neck: Neck supple. No tracheal deviation present.  Cardiovascular: Normal rate.   Pulmonary/Chest: Effort normal. No respiratory distress.  Musculoskeletal: Normal range of motion.  Neurological: He is alert and oriented to person, place, and time.  Skin: Skin is warm and dry.  Psychiatric: He has a normal mood and affect. His behavior is normal.    ED Course  Procedures (including critical care time) Visual acuity, tetracaine opth drop to right eye, slit lamp exam, antibiotic eye ointment.   DIAGNOSTIC STUDIES: Oxygen Saturation is 100% on RA, normal by my interpretation.    COORDINATION OF CARE:  12:29 PM Discussed treatment plan with patient at bedside.  Patient acknowledges and agrees with plan.     MDM   43 y.o. male with redness, itching and drainage of right eye. Will treat for conjunctivitis. First dose of erythromycin Opth. Ointment instilled prior to discharge and patient will continue to use three times a day for the next 5 days. He will follow up with Dr. Lita Mains if symptoms persist. Stable for discharge without further screening indicated at this time.   [address] [place], [place] 09/18/13 [phone]

## 2013-09-18 NOTE — ED Notes (Signed)
Pt with drainage to right eye since Sat, denies fever or N/V

## 2013-09-19 ENCOUNTER — Encounter: Payer: Self-pay | Admitting: Cardiology

## 2013-09-19 ENCOUNTER — Ambulatory Visit (INDEPENDENT_AMBULATORY_CARE_PROVIDER_SITE_OTHER): Payer: Medicare Other | Admitting: Cardiology

## 2013-09-19 VITALS — BP 152/102 | HR 86 | Ht 66.0 in | Wt 192.0 lb

## 2013-09-19 DIAGNOSIS — I2510 Atherosclerotic heart disease of native coronary artery without angina pectoris: Secondary | ICD-10-CM | POA: Diagnosis not present

## 2013-09-19 DIAGNOSIS — Z992 Dependence on renal dialysis: Secondary | ICD-10-CM

## 2013-09-19 DIAGNOSIS — I10 Essential (primary) hypertension: Secondary | ICD-10-CM | POA: Diagnosis not present

## 2013-09-19 DIAGNOSIS — I25810 Atherosclerosis of coronary artery bypass graft(s) without angina pectoris: Secondary | ICD-10-CM

## 2013-09-19 DIAGNOSIS — N186 End stage renal disease: Secondary | ICD-10-CM

## 2013-09-19 DIAGNOSIS — E785 Hyperlipidemia, unspecified: Secondary | ICD-10-CM | POA: Diagnosis not present

## 2013-09-19 DIAGNOSIS — I209 Angina pectoris, unspecified: Secondary | ICD-10-CM

## 2013-09-19 DIAGNOSIS — E782 Mixed hyperlipidemia: Secondary | ICD-10-CM

## 2013-09-19 MED ORDER — CARVEDILOL 6.25 MG PO TABS: 6.2500 mg | ORAL_TABLET | Freq: Two times a day (BID) | ORAL | Status: DC

## 2013-09-19 MED ORDER — AMLODIPINE BESYLATE 5 MG PO TABS: 5.0000 mg | ORAL_TABLET | Freq: Every day | ORAL | Status: DC

## 2013-09-19 NOTE — Assessment & Plan Note (Signed)
Maintain followup with Dr. Kristian Covey.

## 2013-09-19 NOTE — Assessment & Plan Note (Signed)
Multivessel status post CABG at [facility] in July 2013. Follow Cardiolite shows no focal ischemia. Continue medical therapy.

## 2013-09-19 NOTE — Patient Instructions (Signed)
Your physician recommends that you schedule a follow-up appointment in: 3 months PLEASE get blood work before next visit (Lipid)    Your physician has recommended you make the following change in your medication:     INCREASE Coreg to 6.25 mg twice a day  START Norvasc 5 mg daily     Thank you for choosing [facility] !

## 2013-09-19 NOTE — Assessment & Plan Note (Signed)
Increase Coreg to 6.25 mg twice daily and add Norvasc starting at 5 mg daily.

## 2013-09-19 NOTE — Progress Notes (Signed)
Clinical Summary Mr. Bouwman is a 43 y.o.male seen recently in July. He reports no progressive chest pain symptoms, continues to tolerate hemodialysis. Blood pressure remains elevated. He tolerated the addition of Corag at the last visit. We discussed his followup cardiac testing outlined below.  Echocardiogram from July showed severe LVH with LVEF 60-65%, sclerotic aortic valve with trivial aortic regurgitation, MAC, mild left atrial enlargement. Lexiscan Cardiolite from July was negative for ischemia with LVEF 53%.  He has not had recent a lipid panel.  No Known Allergies  Current Outpatient Prescriptions  Medication Sig Dispense Refill  . aspirin 81 MG tablet Take 81 mg by mouth daily.      . carvedilol (COREG) 6.25 MG tablet Take 1 tablet (6.25 mg total) by mouth 2 (two) times daily.  180 tablet  3  . cloNIDine (CATAPRES) 0.2 MG tablet Take 0.2 mg by mouth 2 (two) times daily.      . hydrALAZINE (APRESOLINE) 50 MG tablet Take 50 mg by mouth 2 (two) times daily.      . multivitamin (RENA-VIT) TABS tablet Take 1 tablet by mouth daily.        Marland Kitchen amLODipine (NORVASC) 5 MG tablet Take 1 tablet (5 mg total) by mouth daily.  180 tablet  3   No current facility-administered medications for this visit.    Past Medical History  Diagnosis Date  . Essential hypertension, benign   . Mixed hyperlipidemia   . ESRD on hemodialysis   . History of medication noncompliance   . Hypertensive heart disease   . History of stroke 2012    No ASD, PFO, SOE by TEE 01/2010  . Coronary atherosclerosis of native coronary artery     Multivessel status post CABG at [facility] 07/2011, LVEF 70%    Past Surgical History  Procedure Laterality Date  . Insertion of dialysis catheter  01/02/2011    Procedure: INSERTION OF DIALYSIS CATHETER;  Surgeon: Pryor Ochoa, MD;  Location: [facility] OR;  Service: Vascular;  Laterality: N/A;  Insertion of Dialysis catheter Right Internal Jugular with 24cm dialysis catheter  . Coronary  artery bypass graft  07/2011    [facility] - LIMA to LAD, SVG to circumflex, SVG to ramus intermedius    Social History Mr. Villamizar reports that he has never smoked. He does not have any smokeless tobacco history on file. Mr. Vanzanten reports that he does not drink alcohol.  Review of Systems No palpitations or syncope. Good appetite. NYHA class II dyspnea. Other systems reviewed and negative.  Physical Examination Filed Vitals:   09/19/13 1244  BP: 152/102  Pulse: 86   Filed Weights   09/19/13 1244  Weight: 192 lb (87.091 kg)    Patient appears comfortable at rest.  HEENT: Conjunctiva and lids normal, oropharynx clear.  Neck: Supple, no elevated JVP or carotid bruits, no thyromegaly.  Lungs: Clear to auscultation, nonlabored breathing at rest.  Cardiac: Regular rate and rhythm, no S3, 2/6 systolic murmur, no pericardial rub.  Abdomen: Soft, nontender, bowel sounds present, no guarding or rebound.  Extremities: No pitting edema, distal pulses 2+. Left forearm AV fistula with palpable thrill.  Skin: Warm and dry.  Musculoskeletal: No kyphosis.  Neuropsychiatric: Alert and oriented x3, affect grossly appropriate.   Problem List and Plan   Coronary atherosclerosis of native coronary artery Multivessel status post CABG at [facility] in July 2013. Follow Cardiolite shows no focal ischemia. Continue medical therapy.  Essential hypertension, benign Increase Coreg to 6.25 mg twice  daily and add Norvasc starting at 5 mg daily.  ESRD on hemodialysis Maintain followup with Dr. Kristian Covey.  Mixed hyperlipidemia A fasting lipid panel will be arranged.    Jonelle Sidle, M.D., F.A.C.C.

## 2013-09-19 NOTE — Assessment & Plan Note (Signed)
A fasting lipid panel will be arranged.

## 2013-09-20 DIAGNOSIS — N186 End stage renal disease: Secondary | ICD-10-CM | POA: Diagnosis not present

## 2013-09-20 DIAGNOSIS — N2581 Secondary hyperparathyroidism of renal origin: Secondary | ICD-10-CM | POA: Diagnosis not present

## 2013-09-20 DIAGNOSIS — D509 Iron deficiency anemia, unspecified: Secondary | ICD-10-CM | POA: Diagnosis not present

## 2013-09-22 DIAGNOSIS — D509 Iron deficiency anemia, unspecified: Secondary | ICD-10-CM | POA: Diagnosis not present

## 2013-09-22 DIAGNOSIS — N2581 Secondary hyperparathyroidism of renal origin: Secondary | ICD-10-CM | POA: Diagnosis not present

## 2013-09-22 DIAGNOSIS — N186 End stage renal disease: Secondary | ICD-10-CM | POA: Diagnosis not present

## 2013-09-25 DIAGNOSIS — N2581 Secondary hyperparathyroidism of renal origin: Secondary | ICD-10-CM | POA: Diagnosis not present

## 2013-09-25 DIAGNOSIS — D509 Iron deficiency anemia, unspecified: Secondary | ICD-10-CM | POA: Diagnosis not present

## 2013-09-25 DIAGNOSIS — N186 End stage renal disease: Secondary | ICD-10-CM | POA: Diagnosis not present

## 2013-09-27 DIAGNOSIS — N186 End stage renal disease: Secondary | ICD-10-CM | POA: Diagnosis not present

## 2013-09-27 DIAGNOSIS — N2581 Secondary hyperparathyroidism of renal origin: Secondary | ICD-10-CM | POA: Diagnosis not present

## 2013-09-27 DIAGNOSIS — D509 Iron deficiency anemia, unspecified: Secondary | ICD-10-CM | POA: Diagnosis not present

## 2013-10-09 DIAGNOSIS — N186 End stage renal disease: Secondary | ICD-10-CM | POA: Diagnosis not present

## 2013-10-22 ENCOUNTER — Emergency Department (HOSPITAL_COMMUNITY)
Admission: EM | Admit: 2013-10-22 | Discharge: 2013-10-22 | Disposition: A | Discharge status: Home / Self Care | Payer: Medicare Other | Attending: Emergency Medicine | Admitting: Emergency Medicine

## 2013-10-22 ENCOUNTER — Encounter (HOSPITAL_COMMUNITY): Payer: Self-pay | Admitting: Emergency Medicine

## 2013-10-22 ENCOUNTER — Emergency Department (HOSPITAL_COMMUNITY): Payer: Medicare Other

## 2013-10-22 DIAGNOSIS — Z9119 Patient's noncompliance with other medical treatment and regimen: Secondary | ICD-10-CM | POA: Insufficient documentation

## 2013-10-22 DIAGNOSIS — I1311 Hypertensive heart and chronic kidney disease without heart failure, with stage 5 chronic kidney disease, or end stage renal disease: Secondary | ICD-10-CM | POA: Insufficient documentation

## 2013-10-22 DIAGNOSIS — Z8673 Personal history of transient ischemic attack (TIA), and cerebral infarction without residual deficits: Secondary | ICD-10-CM | POA: Insufficient documentation

## 2013-10-22 DIAGNOSIS — IMO0002 Reserved for concepts with insufficient information to code with codable children: Secondary | ICD-10-CM | POA: Diagnosis not present

## 2013-10-22 DIAGNOSIS — Z91199 Patient's noncompliance with other medical treatment and regimen due to unspecified reason: Secondary | ICD-10-CM | POA: Diagnosis not present

## 2013-10-22 DIAGNOSIS — S46909A Unspecified injury of unspecified muscle, fascia and tendon at shoulder and upper arm level, unspecified arm, initial encounter: Secondary | ICD-10-CM | POA: Insufficient documentation

## 2013-10-22 DIAGNOSIS — Z7982 Long term (current) use of aspirin: Secondary | ICD-10-CM | POA: Diagnosis not present

## 2013-10-22 DIAGNOSIS — Y9289 Other specified places as the place of occurrence of the external cause: Secondary | ICD-10-CM | POA: Insufficient documentation

## 2013-10-22 DIAGNOSIS — Z79899 Other long term (current) drug therapy: Secondary | ICD-10-CM | POA: Insufficient documentation

## 2013-10-22 DIAGNOSIS — I2510 Atherosclerotic heart disease of native coronary artery without angina pectoris: Secondary | ICD-10-CM | POA: Diagnosis not present

## 2013-10-22 DIAGNOSIS — Y9389 Activity, other specified: Secondary | ICD-10-CM | POA: Diagnosis not present

## 2013-10-22 DIAGNOSIS — I10 Essential (primary) hypertension: Secondary | ICD-10-CM | POA: Diagnosis not present

## 2013-10-22 DIAGNOSIS — N186 End stage renal disease: Secondary | ICD-10-CM | POA: Insufficient documentation

## 2013-10-22 DIAGNOSIS — R296 Repeated falls: Secondary | ICD-10-CM | POA: Insufficient documentation

## 2013-10-22 DIAGNOSIS — Z8639 Personal history of other endocrine, nutritional and metabolic disease: Secondary | ICD-10-CM | POA: Insufficient documentation

## 2013-10-22 DIAGNOSIS — Z951 Presence of aortocoronary bypass graft: Secondary | ICD-10-CM | POA: Diagnosis not present

## 2013-10-22 DIAGNOSIS — S4980XA Other specified injuries of shoulder and upper arm, unspecified arm, initial encounter: Secondary | ICD-10-CM | POA: Diagnosis not present

## 2013-10-22 DIAGNOSIS — S46819A Strain of other muscles, fascia and tendons at shoulder and upper arm level, unspecified arm, initial encounter: Secondary | ICD-10-CM | POA: Diagnosis not present

## 2013-10-22 DIAGNOSIS — Z862 Personal history of diseases of the blood and blood-forming organs and certain disorders involving the immune mechanism: Secondary | ICD-10-CM | POA: Insufficient documentation

## 2013-10-22 DIAGNOSIS — S43499A Other sprain of unspecified shoulder joint, initial encounter: Secondary | ICD-10-CM | POA: Diagnosis not present

## 2013-10-22 DIAGNOSIS — M25519 Pain in unspecified shoulder: Secondary | ICD-10-CM | POA: Diagnosis not present

## 2013-10-22 DIAGNOSIS — Z992 Dependence on renal dialysis: Secondary | ICD-10-CM | POA: Diagnosis not present

## 2013-10-22 DIAGNOSIS — S43401A Unspecified sprain of right shoulder joint, initial encounter: Secondary | ICD-10-CM

## 2013-10-22 MED ORDER — HYDROCODONE-ACETAMINOPHEN 5-325 MG PO TABS
1.0000 | ORAL_TABLET | Freq: Once | ORAL | Status: AC
  Administered 2013-10-22: 1 via ORAL
  Filled 2013-10-22: qty 1

## 2013-10-22 MED ORDER — HYDROCODONE-ACETAMINOPHEN 5-325 MG PO TABS: ORAL_TABLET | ORAL | Status: DC

## 2013-10-22 NOTE — ED Notes (Signed)
Pt has dialysis MON, WED, FRIDAY

## 2013-10-22 NOTE — ED Notes (Signed)
Pt was visiting mother here at hospital, fell in  [place] parking lot on ramp. States he had went out to get food,  Ramp is wet, raining today. Pt has food in bag, complaining of right upper arm pain.

## 2013-10-22 NOTE — ED Notes (Signed)
Pt states security is aware of fall

## 2013-10-22 NOTE — Discharge Instructions (Signed)
Shoulder Sprain °A shoulder sprain is the result of damage to the tough, fiber-like tissues (ligaments) that help hold your shoulder in place. The ligaments may be stretched or torn. Besides the main shoulder joint (the ball and socket), there are several smaller joints that connect the bones in this area. A sprain usually involves one of those joints. Most often it is the acromioclavicular (or AC) joint. That is the joint that connects the collarbone (clavicle) and the shoulder blade (scapula) at the top point of the shoulder blade (acromion). °A shoulder sprain is a mild form of what is called a shoulder separation. Recovering from a shoulder sprain may take some time. For some, pain lingers for several months. Most people recover without long term problems. °CAUSES  °· A shoulder sprain is usually caused by some kind of trauma. This might be: °¨ Falling on an outstretched arm. °¨ Being hit hard on the shoulder. °¨ Twisting the arm. °· Shoulder sprains are more likely to occur in people who: °¨ Play sports. °¨ Have balance or coordination problems. °SYMPTOMS  °· Pain when you move your shoulder. °· Limited ability to move the shoulder. °· Swelling and tenderness on top of the shoulder. °· Redness or warmth in the shoulder. °· Bruising. °· A change in the shape of the shoulder. °DIAGNOSIS  °Your healthcare provider may: °· Ask about your symptoms. °· Ask about recent activity that might have caused those symptoms. °· Examine your shoulder. You may be asked to do simple exercises to test movement. The other shoulder will be examined for comparison. °· Order some tests that provide a look inside the body. They can show the extent of the injury. The tests could include: °¨ X-rays. °¨ CT (computed tomography) scan. °¨ MRI (magnetic resonance imaging) scan. °RISKS AND COMPLICATIONS °· Loss of full shoulder motion. °· Ongoing shoulder pain. °TREATMENT  °How long it takes to recover from a shoulder sprain depends on how  severe it was. Treatment options may include: °· Rest. You should not use the arm or shoulder until it heals. °· Ice. For 2 or 3 days after the injury, put an ice pack on the shoulder up to 4 times a day. It should stay on for 15 to 20 minutes each time. Wrap the ice in a towel so it does not touch your skin. °· Over-the-counter medicine to relieve pain. °· A sling or brace. This will keep the arm still while the shoulder is healing. °· Physical therapy or rehabilitation exercises. These will help you regain strength and motion. Ask your healthcare provider when it is OK to begin these exercises. °· Surgery. The need for surgery is rare with a sprained shoulder, but some people may need surgery to keep the joint in place and reduce pain. °HOME CARE INSTRUCTIONS  °· Ask your healthcare provider about what you should and should not do while your shoulder heals. °· Make sure you know how to apply ice to the correct area of your shoulder. °· Talk with your healthcare provider about which medications should be used for pain and swelling. °· If rehabilitation therapy will be needed, ask your healthcare provider to refer you to a therapist. If it is not recommended, then ask about at-home exercises. Find out when exercise should begin. °SEEK MEDICAL CARE IF:  °Your pain, swelling, or redness at the joint increases. °SEEK IMMEDIATE MEDICAL CARE IF:  °· You have a fever. °· You cannot move your arm or shoulder. °Document Released: 05/31/2008 Document   Revised: 04/06/2011 Document Reviewed: 05/31/2008 °[employer]® Patient Information ©2015 [employer]. This information is not intended to replace advice given to you by your health care provider. Make sure you discuss any questions you have with your health care provider. ° °

## 2013-10-25 DIAGNOSIS — N186 End stage renal disease: Secondary | ICD-10-CM | POA: Diagnosis not present

## 2013-10-25 NOTE — ED Provider Notes (Signed)
CSN: [id number]     Arrival date & time 10/22/13  1334 History   First MD Initiated Contact with Patient 10/22/13 1605     No [id number] complaint on file.    (Consider location/radiation/quality/duration/timing/severity/associated sxs/prior Treatment) HPI  Mario Alexander is a 43 y.o. male who presents to the Emergency Department complaining of pain to the right shoulder after a fall on the hospital premises.  He states he slipped on a wet surface falling on the right shoulder.  Pain with movement of the shoulder only.  He denies neck pain, numbness or weakness of the arm, head injury or LOC.  He has not tried medications prior to ED arrival.     Past Medical History  Diagnosis Date  . Essential hypertension, benign   . Mixed hyperlipidemia   . ESRD on hemodialysis   . History of medication noncompliance   . Hypertensive heart disease   . History of stroke 2012    No ASD, PFO, SOE by TEE 01/2010  . Coronary atherosclerosis of native coronary artery     Multivessel status [facility] 07/2011, LVEF 70%   Past Surgical History  Procedure Laterality Date  . Insertion of dialysis catheter  01/02/2011    Procedure: INSERTION OF DIALYSIS CATHETER;  Surgeon: Pryor OchoaJames D Lawson, MD;  [facility];  Service: Vascular;  Laterality: N/A;  Insertion of Dialysis catheter Right Internal Jugular with 24cm dialysis catheter  . 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 Diabetes Mother   . Hypertension Mother   . Hyperlipidemia Mother   . COPD Father   . Lung cancer Father    History  Substance Use Topics  . Smoking status: Never Smoker   . Smokeless tobacco: Not on file  . Alcohol Use: No    Review of Systems  Constitutional: Negative for fever and chills.  Gastrointestinal: Negative for nausea and vomiting.  Genitourinary: Negative for dysuria and difficulty urinating.   Musculoskeletal: Positive for arthralgias. Negative for joint swelling, neck pain and neck stiffness.  Skin: Negative for color change and wound.  Neurological: Negative for dizziness, syncope, weakness, light-headedness, numbness and headaches.  All other systems reviewed and are negative.     Allergies  Review of patient's allergies indicates no known allergies.  Home Medications   Prior to Admission medications   Medication Sig Start Date End Date Taking? Authorizing Provider  amLODipine (NORVASC) 5 MG tablet Take 1 tablet (5 mg total) by mouth daily. 09/19/13  Yes Jonelle SidleSamuel G McDowell, MD  aspirin 81 MG tablet Take 81 mg by mouth daily.   Yes Historical Provider, MD  carvedilol (COREG) 6.25 MG tablet Take 1 tablet (6.25 mg total) by mouth 2 (two) times daily. 09/19/13  Yes Jonelle SidleSamuel G McDowell, MD  cloNIDine (CATAPRES) 0.2 MG tablet Take 0.2 mg by mouth 2 (two) times daily.   Yes Historical Provider, MD  hydrALAZINE (APRESOLINE) 50 MG tablet Take 50 mg by mouth 2 (two) times daily.   Yes Historical Provider, MD  multivitamin (RENA-VIT) TABS tablet Take 1 tablet by mouth daily.     Yes Historical Provider, MD  sevelamer (RENAGEL) 800 MG tablet Take 2,400-3,200 mg by mouth 3 (three) times daily with meals. Takes 4 with meals and 3 with snacks   Yes Historical Provider, MD  HYDROcodone-acetaminophen (NORCO/VICODIN) 5-325 MG per tablet Take  one-two tabs po q 4-6 hrs prn pain 10/22/13   Lavoris Canizales L. Kiyoto Slomski, PA-C   BP 157/97  Pulse 65  Temp(Src) 98.4 F (36.9 C) (Oral)  Resp 16  Ht 5\' 6"  (1.676 m)  Wt 180 lb (81.647 kg)  BMI 29.07 kg/m2  SpO2 98% Physical Exam  Nursing note and vitals reviewed. Constitutional: He is oriented to person, place, and time. He appears well-developed and well-nourished. No distress.  HENT:  Head: Normocephalic and atraumatic.  Eyes: EOM are normal. Pupils are equal, round, and reactive to light.  Neck: Normal range of motion, full passive range of motion  without pain and phonation normal. Neck supple. No spinous process tenderness and no muscular tenderness present.  Cardiovascular: Normal rate, regular rhythm, normal heart sounds and intact distal pulses.   No murmur heard. Pulmonary/Chest: Effort normal and breath sounds normal. No respiratory distress.  Musculoskeletal: He exhibits tenderness. He exhibits no edema.       Right shoulder: He exhibits tenderness and pain. He exhibits no bony tenderness, no swelling, no effusion, no crepitus, no deformity, no laceration, no spasm, normal pulse and normal strength.  Diffuse ttp of the posterior and lateral right shoulder.  No edema, abrasion, or bony deformity.  Pain reproduced with abduction.  Radial pulse and distal sensation intact.  Compartments of the right arm are soft.  Elbow and wrist NT  Neurological: He is alert and oriented to person, place, and time. He exhibits normal muscle tone. Coordination normal.  Skin: Skin is warm and dry. No rash noted.    ED Course  Procedures (including critical care time) Labs Review Labs Reviewed - No data to display  Imaging Review Dg Shoulder Right  10/22/2013   CLINICAL DATA:  Pain.  Slipped and fell.  EXAM: RIGHT SHOULDER - 2+ VIEW  COMPARISON:  05/19/2011  FINDINGS: There is no evidence of fracture or dislocation. There is no evidence of arthropathy or other focal bone abnormality. Soft tissues are unremarkable.  IMPRESSION: Negative.   Electronically Signed   By: Rosalie Gums M.D.   On: 10/22/2013 16:02    EKG Interpretation None      MDM   Final diagnoses:  Shoulder sprain, right, initial encounter    Pt with pain to right shoulder after a mechanical fall.  Likely sprain although possible rotator cuff injury also discussed.  No focal neuro deficits.  Patient advised to arrange f/u with orthopedic in one week if not improving , referral info given.  Pt agrees to plan , rx for vicodin #15    Vondell Sowell L. Trisha Mangle, PA-C 10/25/13 1715

## 2013-10-27 DIAGNOSIS — E611 Iron deficiency: Secondary | ICD-10-CM | POA: Diagnosis not present

## 2013-10-27 DIAGNOSIS — Z992 Dependence on renal dialysis: Secondary | ICD-10-CM | POA: Diagnosis not present

## 2013-10-27 DIAGNOSIS — D509 Iron deficiency anemia, unspecified: Secondary | ICD-10-CM | POA: Diagnosis not present

## 2013-10-27 DIAGNOSIS — Z23 Encounter for immunization: Secondary | ICD-10-CM | POA: Diagnosis not present

## 2013-10-27 DIAGNOSIS — N2581 Secondary hyperparathyroidism of renal origin: Secondary | ICD-10-CM | POA: Diagnosis not present

## 2013-10-27 DIAGNOSIS — N186 End stage renal disease: Secondary | ICD-10-CM | POA: Diagnosis not present

## 2013-10-30 NOTE — ED Provider Notes (Signed)
Medical screening examination/treatment/procedure(s) were performed by non-physician practitioner and as supervising physician I was immediately available for consultation/collaboration.   EKG Interpretation None       Donnetta HutchingBrian Sariyah Corcino, MD 10/30/13 [phone]

## 2013-11-05 ENCOUNTER — Emergency Department (HOSPITAL_COMMUNITY)
Admission: EM | Admit: 2013-11-05 | Discharge: 2013-11-05 | Disposition: A | Discharge status: Home / Self Care | Payer: Medicare Other | Attending: Emergency Medicine | Admitting: Emergency Medicine

## 2013-11-05 ENCOUNTER — Encounter (HOSPITAL_COMMUNITY): Payer: Self-pay | Admitting: Emergency Medicine

## 2013-11-05 DIAGNOSIS — Z8673 Personal history of transient ischemic attack (TIA), and cerebral infarction without residual deficits: Secondary | ICD-10-CM | POA: Insufficient documentation

## 2013-11-05 DIAGNOSIS — I132 Hypertensive heart and chronic kidney disease with heart failure and with stage 5 chronic kidney disease, or end stage renal disease: Secondary | ICD-10-CM | POA: Insufficient documentation

## 2013-11-05 DIAGNOSIS — I509 Heart failure, unspecified: Secondary | ICD-10-CM | POA: Diagnosis not present

## 2013-11-05 DIAGNOSIS — Z992 Dependence on renal dialysis: Secondary | ICD-10-CM | POA: Insufficient documentation

## 2013-11-05 DIAGNOSIS — N186 End stage renal disease: Secondary | ICD-10-CM | POA: Insufficient documentation

## 2013-11-05 DIAGNOSIS — Z7982 Long term (current) use of aspirin: Secondary | ICD-10-CM | POA: Insufficient documentation

## 2013-11-05 DIAGNOSIS — M25511 Pain in right shoulder: Secondary | ICD-10-CM | POA: Diagnosis not present

## 2013-11-05 DIAGNOSIS — Z79899 Other long term (current) drug therapy: Secondary | ICD-10-CM | POA: Insufficient documentation

## 2013-11-05 DIAGNOSIS — Z951 Presence of aortocoronary bypass graft: Secondary | ICD-10-CM | POA: Insufficient documentation

## 2013-11-05 DIAGNOSIS — I2510 Atherosclerotic heart disease of native coronary artery without angina pectoris: Secondary | ICD-10-CM | POA: Diagnosis not present

## 2013-11-05 MED ORDER — KETOROLAC TROMETHAMINE 60 MG/2ML IM SOLN
60.0000 mg | Freq: Once | INTRAMUSCULAR | Status: AC
  Administered 2013-11-05: 60 mg via INTRAMUSCULAR
  Filled 2013-11-05: qty 2

## 2013-11-05 NOTE — Discharge Instructions (Signed)
Arthralgia °Your caregiver has diagnosed you as suffering from an arthralgia. Arthralgia means there is pain in a joint. This can come from many reasons including: °· Bruising the joint which causes soreness (inflammation) in the joint. °· Wear and tear on the joints which occur as we grow older (osteoarthritis). °· Overusing the joint. °· Various forms of arthritis. °· Infections of the joint. °Regardless of the cause of pain in your joint, most of these different pains respond to anti-inflammatory drugs and rest. The exception to this is when a joint is infected, and these cases are treated with antibiotics, if it is a bacterial infection. °HOME CARE INSTRUCTIONS  °· Rest the injured area for as long as directed by your caregiver. Then slowly start using the joint as directed by your caregiver and as the pain allows. Crutches as directed may be useful if the ankles, knees or hips are involved. If the knee was splinted or casted, continue use and care as directed. If an stretchy or elastic wrapping bandage has been applied today, it should be removed and re-applied every 3 to 4 hours. It should not be applied tightly, but firmly enough to keep swelling down. Watch toes and feet for swelling, bluish discoloration, coldness, numbness or excessive pain. If any of these problems (symptoms) occur, remove the ace bandage and re-apply more loosely. If these symptoms persist, contact your caregiver or return to this location. °· For the first 24 hours, keep the injured extremity elevated on pillows while lying down. °· Apply ice for 15-20 minutes to the sore joint every couple hours while awake for the first half day. Then 03-04 times per day for the first 48 hours. Put the ice in a plastic bag and place a towel between the bag of ice and your skin. °· Wear any splinting, casting, elastic bandage applications, or slings as instructed. °· Only take over-the-counter or prescription medicines for pain, discomfort, or fever as  directed by your caregiver. Do not use aspirin immediately after the injury unless instructed by your physician. Aspirin can cause increased bleeding and bruising of the tissues. °· If you were given crutches, continue to use them as instructed and do not resume weight bearing on the sore joint until instructed. °Persistent pain and inability to use the sore joint as directed for more than 2 to 3 days are warning signs indicating that you should see a caregiver for a follow-up visit as soon as possible. Initially, a hairline fracture (break in bone) may not be evident on X-rays. Persistent pain and swelling indicate that further evaluation, non-weight bearing or use of the joint (use of crutches or slings as instructed), or further X-rays are indicated. X-rays may sometimes not show a small fracture until a week or 10 days later. Make a follow-up appointment with your own caregiver or one to whom we have referred you. A radiologist (specialist in reading X-rays) may read your X-rays. Make sure you know how you are to obtain your X-ray results. Do not assume everything is normal if you do not hear from us. °SEEK MEDICAL CARE IF: °Bruising, swelling, or pain increases. °SEEK IMMEDIATE MEDICAL CARE IF:  °· Your fingers or toes are numb or blue. °· The pain is not responding to medications and continues to stay the same or get worse. °· The pain in your joint becomes severe. °· You develop a fever over 102° F (38.9° C). °· It becomes impossible to move or use the joint. °MAKE SURE YOU:  °·   Understand these instructions. °· Will watch your condition. °· Will get help right away if you are not doing well or get worse. °Document Released: 01/12/2005 Document Revised: 04/06/2011 Document Reviewed: 08/31/2007 °ExitCare® Patient Information ©2015 [employer]. This information is not intended to replace advice given to you by your health care provider. Make sure you discuss any questions you have with your health care  provider. ° °Cryotherapy °Cryotherapy means treatment with cold. Ice or gel packs can be used to reduce both pain and swelling. Ice is the most helpful within the first 24 to 48 hours after an injury or flare-up from overusing a muscle or joint. Sprains, strains, spasms, burning pain, shooting pain, and aches can all be eased with ice. Ice can also be used when recovering from surgery. Ice is effective, has very few side effects, and is safe for most people to use. °PRECAUTIONS  °Ice is not a safe treatment option for people with: °· Raynaud phenomenon. This is a condition affecting small blood vessels in the extremities. Exposure to cold may cause your problems to return. °· Cold hypersensitivity. There are many forms of cold hypersensitivity, including: °¨ Cold urticaria. Red, itchy hives appear on the skin when the tissues begin to warm after being iced. °¨ Cold erythema. This is a red, itchy rash caused by exposure to cold. °¨ Cold hemoglobinuria. Red blood cells break down when the tissues begin to warm after being iced. The hemoglobin that carry oxygen are passed into the urine because they cannot combine with blood proteins fast enough. °· Numbness or altered sensitivity in the area being iced. °If you have any of the following conditions, do not use ice until you have discussed cryotherapy with your caregiver: °· Heart conditions, such as arrhythmia, angina, or chronic heart disease. °· High blood pressure. °· Healing wounds or open skin in the area being iced. °· Current infections. °· Rheumatoid arthritis. °· Poor circulation. °· Diabetes. °Ice slows the blood flow in the region it is applied. This is beneficial when trying to stop inflamed tissues from spreading irritating chemicals to surrounding tissues. However, if you expose your skin to cold temperatures for too long or without the proper protection, you can damage your skin or nerves. Watch for signs of skin damage due to cold. °HOME CARE  INSTRUCTIONS °Follow these tips to use ice and cold packs safely. °· Place a dry or damp towel between the ice and skin. A damp towel will cool the skin more quickly, so you may need to shorten the time that the ice is used. °· For a more rapid response, add gentle compression to the ice. °· Ice for no more than 10 to 20 minutes at a time. The bonier the area you are icing, the less time it will take to get the benefits of ice. °· Check your skin after 5 minutes to make sure there are no signs of a poor response to cold or skin damage. °· Rest 20 minutes or more between uses. °· Once your skin is numb, you can end your treatment. You can test numbness by very lightly touching your skin. The touch should be so light that you do not see the skin dimple from the pressure of your fingertip. When using ice, most people will feel these normal sensations in this order: cold, burning, aching, and numbness. °· Do not use ice on someone who cannot communicate their responses to pain, such as small children or people with dementia. °HOW TO MAKE   AN ICE PACK °Ice packs are the most common way to use ice therapy. Other methods include ice massage, ice baths, and cryosprays. Muscle creams that cause a cold, tingly feeling do not offer the same benefits that ice offers and should not be used as a substitute unless recommended by your caregiver. °To make an ice pack, do one of the following: °· Place crushed ice or a bag of frozen vegetables in a sealable plastic bag. Squeeze out the excess air. Place this bag inside another plastic bag. Slide the bag into a pillowcase or place a damp towel between your skin and the bag. °· Mix 3 parts water with 1 part rubbing alcohol. Freeze the mixture in a sealable plastic bag. When you remove the mixture from the freezer, it will be slushy. Squeeze out the excess air. Place this bag inside another plastic bag. Slide the bag into a pillowcase or place a damp towel between your skin and the  bag. °SEEK MEDICAL CARE IF: °· You develop white spots on your skin. This may give the skin a blotchy (mottled) appearance. °· Your skin turns blue or pale. °· Your skin becomes waxy or hard. °· Your swelling gets worse. °MAKE SURE YOU:  °· Understand these instructions. °· Will watch your condition. °· Will get help right away if you are not doing well or get worse. °Document Released: 09/08/2010 Document Revised: 05/29/2013 Document Reviewed: 09/08/2010 °ExitCare® Patient Information ©2015 [employer]. This information is not intended to replace advice given to you by your health care provider. Make sure you discuss any questions you have with your health care provider. ° °

## 2013-11-05 NOTE — ED Provider Notes (Signed)
Medical screening examination/treatment/procedure(s) were performed by non-physician practitioner and as supervising physician I was immediately available for consultation/collaboration.   EKG Interpretation None        Gretel Cantu L Carlyann Placide, MD 11/05/13 1507 

## 2013-11-05 NOTE — ED Provider Notes (Signed)
CSN: [id number]     Arrival date & time 11/05/13  [id number] History   First MD Initiated Contact with Patient 11/05/13 [phone]     Chief Complaint  Patient presents with  . Shoulder Pain     (Consider location/radiation/quality/duration/timing/severity/associated sxs/prior Treatment) Patient is a 43 y.o. male presenting with shoulder pain. The history is provided by the patient. No language interpreter was used.  Shoulder Pain This is a chronic problem. The problem occurs constantly. Pertinent negatives include no chest pain, chills, fever, nausea, numbness or weakness. Associated symptoms comments: He returns to the emergency department for persistent right shoulder pain, initially examined on 10/22/13 here with negative x-ray and orthopedic referral. He has not seen orthopedics at this point. No new injury. No weakness, numbness or tingling in the upper extremity. No chest pain or SOB. The affected shoulder is the right while his dialysis shunt is on the left. .    Past Medical History  Diagnosis Date  . Essential hypertension, benign   . Mixed hyperlipidemia   . ESRD on hemodialysis   . History of medication noncompliance   . Hypertensive heart disease   . History of stroke 2012    No ASD, PFO, SOE by TEE 01/2010  . Coronary atherosclerosis of native coronary artery     [facility], LVEF 70%   Past Surgical History  Procedure Laterality Date  . Insertion of dialysis catheter  01/02/2011    Procedure: INSERTION OF DIALYSIS CATHETER;  Surgeon: Pryor OchoaJames D Lawson, MD;  [facility];  Service: Vascular;  Laterality: N/A;  Insertion of Dialysis catheter Right Internal Jugular with 24cm dialysis catheter  . 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 Diabetes Mother   . Hypertension Mother   . Hyperlipidemia Mother   . COPD Father   . Lung cancer  Father    History  Substance Use Topics  . Smoking status: Never Smoker   . Smokeless tobacco: Not on file  . Alcohol Use: No    Review of Systems  Constitutional: Negative for fever and chills.  Respiratory: Negative.  Negative for shortness of breath.   Cardiovascular: Negative.  Negative for chest pain.  Gastrointestinal: Negative.  Negative for nausea.  Musculoskeletal:       See HPI  Skin: Negative.  Negative for wound.  Neurological: Negative.  Negative for weakness and numbness.      Allergies  Review of patient's allergies indicates no known allergies.  Home Medications   Prior to Admission medications   Medication Sig Start Date End Date Taking? Authorizing Provider  amLODipine (NORVASC) 5 MG tablet Take 1 tablet (5 mg total) by mouth daily. 09/19/13   Jonelle SidleSamuel G McDowell, MD  aspirin 81 MG tablet Take 81 mg by mouth daily.    Historical Provider, MD  carvedilol (COREG) 6.25 MG tablet Take 1 tablet (6.25 mg total) by mouth 2 (two) times daily. 09/19/13   Jonelle SidleSamuel G McDowell, MD  cloNIDine (CATAPRES) 0.2 MG tablet Take 0.2 mg by mouth 2 (two) times daily.    Historical Provider, MD  hydrALAZINE (APRESOLINE) 50 MG tablet Take 50 mg by mouth 2 (two) times daily.    Historical Provider, MD  HYDROcodone-acetaminophen (NORCO/VICODIN) 5-325 MG per tablet Take one-two tabs po q 4-6 hrs prn pain 10/22/13   Tammy L. Triplett, PA-C  multivitamin (RENA-VIT) TABS tablet Take 1 tablet by mouth daily.      Historical Provider, MD  sevelamer (RENAGEL) 800 MG tablet Take 2,400-3,200 mg by mouth 3 (three) times daily with meals. Takes 4 with meals and 3 with snacks    Historical Provider, MD   BP 165/105  Pulse 73  Temp(Src) 97.7 F (36.5 C) (Oral)  Resp 18  Ht 5\' 9"  (1.753 m)  Wt 180 lb (81.647 kg)  BMI 26.57 kg/m2  SpO2 100% Physical Exam  Constitutional: He is oriented to person, place, and time. He appears well-developed and well-nourished.  Neck: Normal range of motion.   Cardiovascular: Normal rate and intact distal pulses.   Pulmonary/Chest: Effort normal. He exhibits no tenderness.  Musculoskeletal: Normal range of motion.  Right shoulder unremarkable in appearance. Shoulder joint nontender. Tenderness is isolated to biceps tendon. No muscular deformity. 5/5 strength of the right upper extremity.   Neurological: He is alert and oriented to person, place, and time.  Skin: Skin is warm and dry.  Psychiatric: He has a normal mood and affect.    ED Course  Procedures (including critical care time) Labs Review Labs Reviewed - No data to display  Imaging Review No results found.   EKG Interpretation None      MDM   Final diagnoses:  None    1. Right shoulder pain  Discussed orthopedic follow up. IM Toradol provided with recommendation for ibuprofen and tylenol outpatient. Narcotic pain medication requested but discussed that they were not indicated for persistent, non-fracture discomfort.     Arnoldo HookerShari A Siddhanth Denk, PA-C 11/05/13 0820  Arnoldo HookerShari A Constanza Mincy, PA-C 11/05/13 [phone]

## 2013-11-05 NOTE — ED Notes (Addendum)
Pt reports right shoulder pain "for awhile". Pt reports tried otc pain meds with no relief. No obvious deformity noted. Pt reports fell last week on right side and reports pain ever since. Distal pulses intact.

## 2013-11-10 DIAGNOSIS — N186 End stage renal disease: Secondary | ICD-10-CM | POA: Diagnosis not present

## 2013-11-10 DIAGNOSIS — Z992 Dependence on renal dialysis: Secondary | ICD-10-CM | POA: Diagnosis not present

## 2013-11-15 ENCOUNTER — Emergency Department (HOSPITAL_COMMUNITY)
Admission: EM | Admit: 2013-11-15 | Discharge: 2013-11-15 | Disposition: A | Discharge status: Home / Self Care | Payer: Medicare Other | Attending: Emergency Medicine | Admitting: Emergency Medicine

## 2013-11-15 ENCOUNTER — Encounter (HOSPITAL_COMMUNITY): Payer: Self-pay | Admitting: Emergency Medicine

## 2013-11-15 DIAGNOSIS — W57XXXA Bitten or stung by nonvenomous insect and other nonvenomous arthropods, initial encounter: Secondary | ICD-10-CM | POA: Insufficient documentation

## 2013-11-15 DIAGNOSIS — Z7982 Long term (current) use of aspirin: Secondary | ICD-10-CM | POA: Insufficient documentation

## 2013-11-15 DIAGNOSIS — L089 Local infection of the skin and subcutaneous tissue, unspecified: Secondary | ICD-10-CM | POA: Insufficient documentation

## 2013-11-15 DIAGNOSIS — Y9389 Activity, other specified: Secondary | ICD-10-CM | POA: Diagnosis not present

## 2013-11-15 DIAGNOSIS — Z8673 Personal history of transient ischemic attack (TIA), and cerebral infarction without residual deficits: Secondary | ICD-10-CM | POA: Insufficient documentation

## 2013-11-15 DIAGNOSIS — Y929 Unspecified place or not applicable: Secondary | ICD-10-CM | POA: Insufficient documentation

## 2013-11-15 DIAGNOSIS — S40861A Insect bite (nonvenomous) of right upper arm, initial encounter: Secondary | ICD-10-CM | POA: Diagnosis not present

## 2013-11-15 DIAGNOSIS — N186 End stage renal disease: Secondary | ICD-10-CM | POA: Diagnosis not present

## 2013-11-15 DIAGNOSIS — Z8639 Personal history of other endocrine, nutritional and metabolic disease: Secondary | ICD-10-CM | POA: Diagnosis not present

## 2013-11-15 DIAGNOSIS — Z992 Dependence on renal dialysis: Secondary | ICD-10-CM | POA: Insufficient documentation

## 2013-11-15 DIAGNOSIS — I120 Hypertensive chronic kidney disease with stage 5 chronic kidney disease or end stage renal disease: Secondary | ICD-10-CM | POA: Diagnosis not present

## 2013-11-15 DIAGNOSIS — Z79899 Other long term (current) drug therapy: Secondary | ICD-10-CM | POA: Insufficient documentation

## 2013-11-15 DIAGNOSIS — I2510 Atherosclerotic heart disease of native coronary artery without angina pectoris: Secondary | ICD-10-CM | POA: Diagnosis not present

## 2013-11-15 DIAGNOSIS — S60562A Insect bite (nonvenomous) of left hand, initial encounter: Secondary | ICD-10-CM | POA: Diagnosis not present

## 2013-11-15 DIAGNOSIS — I119 Hypertensive heart disease without heart failure: Secondary | ICD-10-CM | POA: Insufficient documentation

## 2013-11-15 DIAGNOSIS — Z951 Presence of aortocoronary bypass graft: Secondary | ICD-10-CM | POA: Insufficient documentation

## 2013-11-15 DIAGNOSIS — S60561A Insect bite (nonvenomous) of right hand, initial encounter: Secondary | ICD-10-CM | POA: Diagnosis not present

## 2013-11-15 DIAGNOSIS — I10 Essential (primary) hypertension: Secondary | ICD-10-CM | POA: Diagnosis not present

## 2013-11-15 DIAGNOSIS — S0006XA Insect bite (nonvenomous) of scalp, initial encounter: Secondary | ICD-10-CM | POA: Diagnosis not present

## 2013-11-15 MED ORDER — HYDROCODONE-ACETAMINOPHEN 5-325 MG PO TABS: 1.0000 | ORAL_TABLET | Freq: Four times a day (QID) | ORAL | Status: DC | PRN

## 2013-11-15 MED ORDER — CEPHALEXIN 500 MG PO CAPS: 500.0000 mg | ORAL_CAPSULE | Freq: Three times a day (TID) | ORAL | Status: DC

## 2013-11-15 MED ORDER — DIPHENHYDRAMINE HCL 25 MG PO TABS: 25.0000 mg | ORAL_TABLET | Freq: Four times a day (QID) | ORAL | Status: DC

## 2013-11-15 NOTE — ED Notes (Signed)
Rash to bil arms x 2 days with itching.

## 2013-11-15 NOTE — ED Provider Notes (Signed)
CSN: [id number]     Arrival date & time 11/15/13  1844 History  This chart was scribed for non-physician practitioner working with Vida RollerBrian D Miller, MD by Murriel HopperAlec Bankhead, ED Scribe. This patient was seen in room APFT23/APFT23 and the patient's care was started at 7:25 PM.    Chief Complaint  Patient presents with  . Rash     (Consider location/radiation/quality/duration/timing/severity/associated sxs/prior Treatment) The history is provided by the patient. No language interpreter was used.    HPI Comments: Mario Alexander is a 43 y.o. male who presents to the Emergency Department complaining of a rash that started on his inner upper arms that began two days ago with associated itching and redness. Pt reports applying alcohol for itching relief with little change in symptoms. Pt is on dialysis and receives treatments 3 days per week, and his last treatment was this morning. Prior to treatment, he was informed that his blood pressure was elevated (BP at Triage 139/107). Pt also notes muscle soreness on his right lower leg that has been there since he fell a week ago in addition to right upper arm pain that has been there since the fall. He was evaluated here in the ED after his fall and treated for muscle strain and contusions.  He states that the  He denies SOB, fever, nausea, vomiting, and diarrhea.     Past Medical History  Diagnosis Date  . Essential hypertension, benign   . Mixed hyperlipidemia   . ESRD on hemodialysis   . History of medication noncompliance   . Hypertensive heart disease   . History of stroke 2012    No ASD, PFO, SOE by TEE 01/2010  . Coronary atherosclerosis of native coronary artery     [facility], LVEF 70%   Past Surgical History  Procedure Laterality Date  . Insertion of dialysis catheter  01/02/2011    Procedure: INSERTION OF DIALYSIS CATHETER;  Surgeon: Pryor OchoaJames D Lawson, MD;  [facility];  Service: Vascular;  Laterality: N/A;   Insertion of Dialysis catheter Right Internal Jugular with 24cm dialysis catheter  . 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 Diabetes Mother   . Hypertension Mother   . Hyperlipidemia Mother   . COPD Father   . Lung cancer Father    History  Substance Use Topics  . Smoking status: Never Smoker   . Smokeless tobacco: Not on file  . Alcohol Use: No    Review of Systems  Constitutional: Negative for fever.  Respiratory: Negative for shortness of breath.   Gastrointestinal: Negative for nausea, vomiting and diarrhea.  Musculoskeletal:       Right shoulder pain, right lower leg pain.  Skin: Positive for color change and rash.   All other systems negative.    Allergies  Review of patient's allergies indicates no known allergies.  Home Medications   Prior to Admission medications   Medication Sig Start Date End Date Taking? Authorizing Provider  amLODipine (NORVASC) 5 MG tablet Take 1 tablet (5 mg total) by mouth daily. 09/19/13   Jonelle SidleSamuel G McDowell, MD  aspirin 81 MG tablet Take 81 mg by mouth daily.    Historical Provider, MD  carvedilol (COREG) 6.25 MG tablet Take 1 tablet (6.25 mg total) by mouth 2 (two) times daily. 09/19/13   Jonelle SidleSamuel G McDowell,  MD  cloNIDine (CATAPRES) 0.2 MG tablet Take 0.2 mg by mouth 2 (two) times daily.    Historical Provider, MD  hydrALAZINE (APRESOLINE) 50 MG tablet Take 50 mg by mouth 2 (two) times daily.    Historical Provider, MD  HYDROcodone-acetaminophen (NORCO/VICODIN) 5-325 MG per tablet Take one-two tabs po q 4-6 hrs prn pain 10/22/13   Tammy L. Triplett, PA-C  multivitamin (RENA-VIT) TABS tablet Take 1 tablet by mouth daily.      Historical Provider, MD  sevelamer (RENAGEL) 800 MG tablet Take 2,400-3,200 mg by mouth 3 (three) times daily with meals. Takes 4 with meals and 3 with snacks    Historical Provider, MD   BP 139/107   Pulse 107  Temp(Src) 98.8 F (37.1 C) (Oral)  Resp 20  Ht 5\' 6"  (1.676 m)  Wt 180 lb (81.647 kg)  BMI 29.07 kg/m2  SpO2 96% Physical Exam  Nursing note and vitals reviewed. Constitutional: He is oriented to person, place, and time. He appears well-developed and well-nourished.  HENT:  Head: Normocephalic and atraumatic.  Eyes: Conjunctivae are normal.  Neck: Normal range of motion. Neck supple.  Cardiovascular: Normal rate.   Pulmonary/Chest: Effort normal. No respiratory distress.  Musculoskeletal: He exhibits tenderness.  Tenderness to lateral aspect of right lower leg that patient states is the same as it was a week ago when he was evaluated for a fall Dorsal pulses are equal Good touch sensation Right shoulder with full range of motion.   Neurological: He is alert and oriented to person, place, and time.  Patient ambulatory with steady gait no difficulty with weight bearing on the right.   Skin: Skin is warm and dry. Rash noted. There is erythema.  Small, red, raised areas consistent with insect bites palmar aspect of bilateral inner aspect of arms. One area noted right scalp area.   Psychiatric: He has a normal mood and affect.    ED Course  Procedures (including critical care time)  DIAGNOSTIC STUDIES: Oxygen Saturation is 96% on RA, adequate by my interpretation.    COORDINATION OF CARE: 7:30 PM Discussed treatment plan with pt at bedside and pt agreed to plan.   MDM  43 y.o. male with rash and redness bilateral upper arms consistent with insect bites. Will treat for itching and possible early infection. Also right shoulder and right leg pain that is same as one week ago. Will give a prepack of of hydrocodone for pain and patient is to see his PCP for further pain management.  Discussed with the patient clinical findings and plan of care. all questioned fully answered. He will return if any problems arise. Stable for discharge without red streaking or fever. BP normal  at d/c.  BP 116/87  Pulse 98  Temp(Src) 98.8 F (37.1 C) (Oral)  Resp 20  Ht 5\' 6"  (1.676 m)  Wt 180 lb (81.647 kg)  BMI 29.07 kg/m2  SpO2 96%    Medication List    TAKE these medications       cephALEXin 500 MG capsule  Commonly known as:  KEFLEX  Take 1 capsule (500 mg total) by mouth 3 (three) times daily.     diphenhydrAMINE 25 MG tablet  Commonly known as:  BENADRYL  Take 1 tablet (25 mg total) by mouth every 6 (six) hours.     HYDROcodone-acetaminophen 5-325 MG per tablet  Commonly known as:  NORCO  Take 1 tablet by mouth every 6 (six) hours as needed for moderate pain.  ASK your doctor about these medications       amLODipine 5 MG tablet  Commonly known as:  NORVASC  Take 1 tablet (5 mg total) by mouth daily.     aspirin EC 81 MG tablet  Take 81 mg by mouth daily.     carvedilol 6.25 MG tablet  Commonly known as:  COREG  Take 1 tablet (6.25 mg total) by mouth 2 (two) times daily.     cloNIDine 0.2 MG tablet  Commonly known as:  CATAPRES  Take 0.2 mg by mouth 2 (two) times daily.     hydrALAZINE 50 MG tablet  Commonly known as:  APRESOLINE  Take 50 mg by mouth 2 (two) times daily.     multivitamin Tabs tablet  Take 1 tablet by mouth daily.     sevelamer 800 MG tablet  Commonly known as:  RENAGEL  Take 2,400-3,200 mg by mouth 3 (three) times daily with meals. Takes 4 with meals and 3 with snacks           Janne Napoleon, NP 11/15/13 [phone]

## 2013-11-17 NOTE — ED Provider Notes (Signed)
Medical screening examination/treatment/procedure(s) were performed by non-physician practitioner and as supervising physician I was immediately available for consultation/collaboration.    Vida RollerBrian D Elaura Calix, MD 11/17/13 [phone]

## 2013-11-20 MED FILL — Hydrocodone-Acetaminophen Tab 5-325 MG: ORAL | Qty: 6 | Status: AC

## 2013-11-25 DIAGNOSIS — Z992 Dependence on renal dialysis: Secondary | ICD-10-CM | POA: Diagnosis not present

## 2013-11-25 DIAGNOSIS — N186 End stage renal disease: Secondary | ICD-10-CM | POA: Diagnosis not present

## 2013-11-27 DIAGNOSIS — N2581 Secondary hyperparathyroidism of renal origin: Secondary | ICD-10-CM | POA: Diagnosis not present

## 2013-11-27 DIAGNOSIS — D509 Iron deficiency anemia, unspecified: Secondary | ICD-10-CM | POA: Diagnosis not present

## 2013-11-27 DIAGNOSIS — Z992 Dependence on renal dialysis: Secondary | ICD-10-CM | POA: Diagnosis not present

## 2013-11-27 DIAGNOSIS — N186 End stage renal disease: Secondary | ICD-10-CM | POA: Diagnosis not present

## 2013-12-25 DIAGNOSIS — N186 End stage renal disease: Secondary | ICD-10-CM | POA: Diagnosis not present

## 2013-12-25 DIAGNOSIS — Z992 Dependence on renal dialysis: Secondary | ICD-10-CM | POA: Diagnosis not present

## 2013-12-26 ENCOUNTER — Ambulatory Visit (INDEPENDENT_AMBULATORY_CARE_PROVIDER_SITE_OTHER): Payer: Medicare Other | Admitting: Cardiology

## 2013-12-26 ENCOUNTER — Telehealth: Payer: Self-pay | Admitting: *Deleted

## 2013-12-26 ENCOUNTER — Encounter: Payer: Self-pay | Admitting: Cardiology

## 2013-12-26 VITALS — BP 144/92 | HR 83 | Ht 66.0 in | Wt 184.0 lb

## 2013-12-26 DIAGNOSIS — I25709 Atherosclerosis of coronary artery bypass graft(s), unspecified, with unspecified angina pectoris: Secondary | ICD-10-CM

## 2013-12-26 DIAGNOSIS — I10 Essential (primary) hypertension: Secondary | ICD-10-CM | POA: Diagnosis not present

## 2013-12-26 DIAGNOSIS — E782 Mixed hyperlipidemia: Secondary | ICD-10-CM | POA: Diagnosis not present

## 2013-12-26 DIAGNOSIS — I2510 Atherosclerotic heart disease of native coronary artery without angina pectoris: Secondary | ICD-10-CM | POA: Diagnosis not present

## 2013-12-26 DIAGNOSIS — E785 Hyperlipidemia, unspecified: Secondary | ICD-10-CM | POA: Diagnosis not present

## 2013-12-26 LAB — LIPID PANEL
Cholesterol: 241 mg/dL — ABNORMAL HIGH (ref 0–200)
HDL: 41 mg/dL (ref >39)
LDL CALC: 155 mg/dL — AB (ref 0–99)
TRIGLYCERIDES: 225 mg/dL — AB (ref <150)
Total CHOL/HDL Ratio: 5.9 Ratio
VLDL: 45 mg/dL — AB (ref 0–40)

## 2013-12-26 MED ORDER — ATORVASTATIN CALCIUM 20 MG PO TABS: 20.0000 mg | ORAL_TABLET | Freq: Every day | ORAL | Status: DC

## 2013-12-26 NOTE — Patient Instructions (Signed)
Your physician wants you to follow-up in: 6 months You will receive a reminder letter in the mail two months in advance. If you don't receive a letter, please call our office to schedule the follow-up appointment.     Your physician recommends that you continue on your current medications as directed. Please refer to the Current Medication list given to you today.      Thank you for choosing [facility] !        

## 2013-12-26 NOTE — Assessment & Plan Note (Signed)
Blood pressure mildly elevated today, patient had not yet taken his medications while NPO for lab work. No changes were made today.

## 2013-12-26 NOTE — Assessment & Plan Note (Signed)
Follow-up lab work pending.

## 2013-12-26 NOTE — Telephone Encounter (Signed)
Pt made aware to take lipitor 20 mg daily and labs repeat in 3 months. Medication sent to pharmacy. Forwarded results to Dr. Renard MatterMcinnis

## 2013-12-26 NOTE — Assessment & Plan Note (Signed)
Symptomatically stable on medical therapy status post CABG in 2013 as outlined above. Recent follow-up ischemic testing was low risk, and LVEF remains normal range. Continue medical therapy and observation, follow-up in 6 months.

## 2013-12-26 NOTE — Progress Notes (Signed)
Reason for visit: CAD, hypertension, hyperlipidemia  Clinical Summary Mr. Manson PasseyBrown is a 43 y.o.male last seen in August. He presents for a routine follow-up visit. He reports compliance with his cardiac regimen including aspirin, beta blocker, hydralazine, Norvasc. No angina symptoms at this time with stable NYHA class II dyspnea. Echocardiogram from July showed severe LVH with LVEF 60-65%, sclerotic aortic valve with trivial aortic regurgitation, MAC, mild left atrial enlargement. Lexiscan Cardiolite from July was negative for ischemia with LVEF 53%.  He had liver and lipid panel obtained this morning just prior to his visit, results pending. Presently not on statin therapy. We are obtaining baseline to see if he might benefit from medical therapy.  Blood pressure is mildly elevated today, he had not yet taken his medications while NPO for his lab work.  He reports tolerating hemodialysis sessions, followed by Dr. Kristian CoveyBefekadu.  No Known Allergies  Current Outpatient Prescriptions  Medication Sig Dispense Refill  . amLODipine (NORVASC) 5 MG tablet Take 1 tablet (5 mg total) by mouth daily. 180 tablet 3  . aspirin EC 81 MG tablet Take 81 mg by mouth daily.    . carvedilol (COREG) 6.25 MG tablet Take 1 tablet (6.25 mg total) by mouth 2 (two) times daily. 180 tablet 3  . cloNIDine (CATAPRES) 0.2 MG tablet Take 0.2 mg by mouth 2 (two) times daily.    . diphenhydrAMINE (BENADRYL) 25 MG tablet Take 1 tablet (25 mg total) by mouth every 6 (six) hours. 20 tablet 0  . hydrALAZINE (APRESOLINE) 50 MG tablet Take 50 mg by mouth 2 (two) times daily.    Marland Kitchen. HYDROcodone-acetaminophen (NORCO) 5-325 MG per tablet Take 1 tablet by mouth every 6 (six) hours as needed for moderate pain. 6 tablet 0  . multivitamin (RENA-VIT) TABS tablet Take 1 tablet by mouth daily.      . sevelamer (RENAGEL) 800 MG tablet Take 2,400-3,200 mg by mouth 3 (three) times daily with meals. Takes 4 with meals and 3 with snacks     No  current facility-administered medications for this visit.    Past Medical History  Diagnosis Date  . Essential hypertension, benign   . Mixed hyperlipidemia   . ESRD on hemodialysis   . History of medication noncompliance   . Hypertensive heart disease   . History of stroke 2012    No ASD, PFO, SOE by TEE 01/2010  . Coronary atherosclerosis of native coronary artery     [facility], LVEF 70%    Past Surgical History  Procedure Laterality Date  . Insertion of dialysis catheter  01/02/2011    Procedure: INSERTION OF DIALYSIS CATHETER;  Surgeon: Pryor OchoaJames D Lawson, MD;  [facility];  Service: Vascular;  Laterality: N/A;  Insertion of Dialysis catheter Right Internal Jugular with 24cm dialysis catheter  . 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 Manson PasseyBrown reports that he has never smoked. He does not have any smokeless tobacco history on file. Mr. Manson PasseyBrown reports that he does not drink alcohol.  Review of Systems Complete review of systems negative except as otherwise outlined in the clinical summary and also the following. No palpitations or dizziness. No reported bleeding episodes. No leg edema.  Physical Examination Filed Vitals:   12/26/13 0915  BP: 144/92  Pulse: 83   Filed Weights   12/26/13 0915  Weight: 184 lb (83.462  kg)    Patient appears comfortable at rest.  HEENT: Conjunctiva and lids normal, oropharynx clear.  Neck: Supple, no elevated JVP or carotid bruits, no thyromegaly.  Lungs: Clear to auscultation, nonlabored breathing at rest.  Cardiac: Regular rate and rhythm, no S3, 2/6 systolic murmur, no pericardial rub.  Abdomen: Soft, nontender, bowel sounds present, no guarding or rebound.  Extremities: No pitting edema, distal pulses 2+. Left forearm AV fistula with palpable thrill.  Skin: Warm and dry.  Musculoskeletal: No kyphosis.   Neuropsychiatric: Alert and oriented x3, affect grossly appropriate.   Problem List and Plan   Coronary atherosclerosis of native coronary artery Symptomatically stable on medical therapy status post CABG in 2013 as outlined above. Recent follow-up ischemic testing was low risk, and LVEF remains normal range. Continue medical therapy and observation, follow-up in 6 months.  Essential hypertension, benign Blood pressure mildly elevated today, patient had not yet taken his medications while NPO for lab work. No changes were made today.  Mixed hyperlipidemia Follow-up lab work pending.    Jonelle SidleSamuel G. Gunner Iodice, M.D., F.A.C.C.

## 2013-12-26 NOTE — Telephone Encounter (Signed)
-----   Message from Jonelle SidleSamuel G McDowell, MD sent at 12/26/2013  5:05 PM EST ----- Reviewed. Total cholesterol 241, LDL 155. With cardiovascular disease, would add statin therapy.  Begin with Lipitor 20 mg by mouth daily at bedtime. If he tolerates this well, check FLP and LFTs in the next 3 months.

## 2013-12-27 ENCOUNTER — Other Ambulatory Visit: Payer: Self-pay | Admitting: *Deleted

## 2013-12-27 DIAGNOSIS — N2581 Secondary hyperparathyroidism of renal origin: Secondary | ICD-10-CM | POA: Diagnosis not present

## 2013-12-27 DIAGNOSIS — Z992 Dependence on renal dialysis: Secondary | ICD-10-CM | POA: Diagnosis not present

## 2013-12-27 DIAGNOSIS — T82510A Breakdown (mechanical) of surgically created arteriovenous fistula, initial encounter: Secondary | ICD-10-CM

## 2013-12-27 DIAGNOSIS — D509 Iron deficiency anemia, unspecified: Secondary | ICD-10-CM | POA: Diagnosis not present

## 2013-12-27 DIAGNOSIS — N186 End stage renal disease: Secondary | ICD-10-CM | POA: Diagnosis not present

## 2013-12-27 DIAGNOSIS — D631 Anemia in chronic kidney disease: Secondary | ICD-10-CM | POA: Diagnosis not present

## 2013-12-29 DIAGNOSIS — N2581 Secondary hyperparathyroidism of renal origin: Secondary | ICD-10-CM | POA: Diagnosis not present

## 2013-12-29 DIAGNOSIS — D509 Iron deficiency anemia, unspecified: Secondary | ICD-10-CM | POA: Diagnosis not present

## 2013-12-29 DIAGNOSIS — N186 End stage renal disease: Secondary | ICD-10-CM | POA: Diagnosis not present

## 2013-12-29 DIAGNOSIS — Z992 Dependence on renal dialysis: Secondary | ICD-10-CM | POA: Diagnosis not present

## 2013-12-29 DIAGNOSIS — D631 Anemia in chronic kidney disease: Secondary | ICD-10-CM | POA: Diagnosis not present

## 2014-01-01 DIAGNOSIS — D509 Iron deficiency anemia, unspecified: Secondary | ICD-10-CM | POA: Diagnosis not present

## 2014-01-01 DIAGNOSIS — N2581 Secondary hyperparathyroidism of renal origin: Secondary | ICD-10-CM | POA: Diagnosis not present

## 2014-01-01 DIAGNOSIS — N186 End stage renal disease: Secondary | ICD-10-CM | POA: Diagnosis not present

## 2014-01-01 DIAGNOSIS — D631 Anemia in chronic kidney disease: Secondary | ICD-10-CM | POA: Diagnosis not present

## 2014-01-01 DIAGNOSIS — Z992 Dependence on renal dialysis: Secondary | ICD-10-CM | POA: Diagnosis not present

## 2014-01-03 DIAGNOSIS — Z992 Dependence on renal dialysis: Secondary | ICD-10-CM | POA: Diagnosis not present

## 2014-01-03 DIAGNOSIS — N2581 Secondary hyperparathyroidism of renal origin: Secondary | ICD-10-CM | POA: Diagnosis not present

## 2014-01-03 DIAGNOSIS — D631 Anemia in chronic kidney disease: Secondary | ICD-10-CM | POA: Diagnosis not present

## 2014-01-03 DIAGNOSIS — D509 Iron deficiency anemia, unspecified: Secondary | ICD-10-CM | POA: Diagnosis not present

## 2014-01-03 DIAGNOSIS — N186 End stage renal disease: Secondary | ICD-10-CM | POA: Diagnosis not present

## 2014-01-05 DIAGNOSIS — N2581 Secondary hyperparathyroidism of renal origin: Secondary | ICD-10-CM | POA: Diagnosis not present

## 2014-01-05 DIAGNOSIS — D631 Anemia in chronic kidney disease: Secondary | ICD-10-CM | POA: Diagnosis not present

## 2014-01-05 DIAGNOSIS — Z992 Dependence on renal dialysis: Secondary | ICD-10-CM | POA: Diagnosis not present

## 2014-01-05 DIAGNOSIS — N186 End stage renal disease: Secondary | ICD-10-CM | POA: Diagnosis not present

## 2014-01-05 DIAGNOSIS — D509 Iron deficiency anemia, unspecified: Secondary | ICD-10-CM | POA: Diagnosis not present

## 2014-01-08 DIAGNOSIS — D509 Iron deficiency anemia, unspecified: Secondary | ICD-10-CM | POA: Diagnosis not present

## 2014-01-08 DIAGNOSIS — N2581 Secondary hyperparathyroidism of renal origin: Secondary | ICD-10-CM | POA: Diagnosis not present

## 2014-01-08 DIAGNOSIS — Z992 Dependence on renal dialysis: Secondary | ICD-10-CM | POA: Diagnosis not present

## 2014-01-08 DIAGNOSIS — D631 Anemia in chronic kidney disease: Secondary | ICD-10-CM | POA: Diagnosis not present

## 2014-01-08 DIAGNOSIS — N186 End stage renal disease: Secondary | ICD-10-CM | POA: Diagnosis not present

## 2014-01-10 DIAGNOSIS — D509 Iron deficiency anemia, unspecified: Secondary | ICD-10-CM | POA: Diagnosis not present

## 2014-01-10 DIAGNOSIS — D631 Anemia in chronic kidney disease: Secondary | ICD-10-CM | POA: Diagnosis not present

## 2014-01-10 DIAGNOSIS — N2581 Secondary hyperparathyroidism of renal origin: Secondary | ICD-10-CM | POA: Diagnosis not present

## 2014-01-10 DIAGNOSIS — Z992 Dependence on renal dialysis: Secondary | ICD-10-CM | POA: Diagnosis not present

## 2014-01-10 DIAGNOSIS — N186 End stage renal disease: Secondary | ICD-10-CM | POA: Diagnosis not present

## 2014-01-12 DIAGNOSIS — N2581 Secondary hyperparathyroidism of renal origin: Secondary | ICD-10-CM | POA: Diagnosis not present

## 2014-01-12 DIAGNOSIS — D631 Anemia in chronic kidney disease: Secondary | ICD-10-CM | POA: Diagnosis not present

## 2014-01-12 DIAGNOSIS — D509 Iron deficiency anemia, unspecified: Secondary | ICD-10-CM | POA: Diagnosis not present

## 2014-01-12 DIAGNOSIS — Z992 Dependence on renal dialysis: Secondary | ICD-10-CM | POA: Diagnosis not present

## 2014-01-12 DIAGNOSIS — N186 End stage renal disease: Secondary | ICD-10-CM | POA: Diagnosis not present

## 2014-01-15 DIAGNOSIS — N186 End stage renal disease: Secondary | ICD-10-CM | POA: Diagnosis not present

## 2014-01-15 DIAGNOSIS — D631 Anemia in chronic kidney disease: Secondary | ICD-10-CM | POA: Diagnosis not present

## 2014-01-15 DIAGNOSIS — N2581 Secondary hyperparathyroidism of renal origin: Secondary | ICD-10-CM | POA: Diagnosis not present

## 2014-01-15 DIAGNOSIS — D509 Iron deficiency anemia, unspecified: Secondary | ICD-10-CM | POA: Diagnosis not present

## 2014-01-15 DIAGNOSIS — Z992 Dependence on renal dialysis: Secondary | ICD-10-CM | POA: Diagnosis not present

## 2014-01-17 DIAGNOSIS — D631 Anemia in chronic kidney disease: Secondary | ICD-10-CM | POA: Diagnosis not present

## 2014-01-17 DIAGNOSIS — Z992 Dependence on renal dialysis: Secondary | ICD-10-CM | POA: Diagnosis not present

## 2014-01-17 DIAGNOSIS — N186 End stage renal disease: Secondary | ICD-10-CM | POA: Diagnosis not present

## 2014-01-17 DIAGNOSIS — D509 Iron deficiency anemia, unspecified: Secondary | ICD-10-CM | POA: Diagnosis not present

## 2014-01-17 DIAGNOSIS — N2581 Secondary hyperparathyroidism of renal origin: Secondary | ICD-10-CM | POA: Diagnosis not present

## 2014-01-20 DIAGNOSIS — N186 End stage renal disease: Secondary | ICD-10-CM | POA: Diagnosis not present

## 2014-01-20 DIAGNOSIS — D631 Anemia in chronic kidney disease: Secondary | ICD-10-CM | POA: Diagnosis not present

## 2014-01-20 DIAGNOSIS — N2581 Secondary hyperparathyroidism of renal origin: Secondary | ICD-10-CM | POA: Diagnosis not present

## 2014-01-20 DIAGNOSIS — D509 Iron deficiency anemia, unspecified: Secondary | ICD-10-CM | POA: Diagnosis not present

## 2014-01-20 DIAGNOSIS — Z992 Dependence on renal dialysis: Secondary | ICD-10-CM | POA: Diagnosis not present

## 2014-01-22 DIAGNOSIS — N2581 Secondary hyperparathyroidism of renal origin: Secondary | ICD-10-CM | POA: Diagnosis not present

## 2014-01-22 DIAGNOSIS — D509 Iron deficiency anemia, unspecified: Secondary | ICD-10-CM | POA: Diagnosis not present

## 2014-01-22 DIAGNOSIS — D631 Anemia in chronic kidney disease: Secondary | ICD-10-CM | POA: Diagnosis not present

## 2014-01-22 DIAGNOSIS — Z992 Dependence on renal dialysis: Secondary | ICD-10-CM | POA: Diagnosis not present

## 2014-01-22 DIAGNOSIS — N186 End stage renal disease: Secondary | ICD-10-CM | POA: Diagnosis not present

## 2014-01-24 ENCOUNTER — Encounter: Payer: Self-pay | Admitting: Vascular Surgery

## 2014-01-24 DIAGNOSIS — N186 End stage renal disease: Secondary | ICD-10-CM | POA: Diagnosis not present

## 2014-01-24 DIAGNOSIS — D631 Anemia in chronic kidney disease: Secondary | ICD-10-CM | POA: Diagnosis not present

## 2014-01-24 DIAGNOSIS — N2581 Secondary hyperparathyroidism of renal origin: Secondary | ICD-10-CM | POA: Diagnosis not present

## 2014-01-24 DIAGNOSIS — Z992 Dependence on renal dialysis: Secondary | ICD-10-CM | POA: Diagnosis not present

## 2014-01-24 DIAGNOSIS — D509 Iron deficiency anemia, unspecified: Secondary | ICD-10-CM | POA: Diagnosis not present

## 2014-01-25 ENCOUNTER — Other Ambulatory Visit: Payer: Self-pay

## 2014-01-25 ENCOUNTER — Ambulatory Visit (INDEPENDENT_AMBULATORY_CARE_PROVIDER_SITE_OTHER): Payer: Medicare Other | Admitting: Vascular Surgery

## 2014-01-25 ENCOUNTER — Ambulatory Visit (HOSPITAL_COMMUNITY)
Admission: RE | Admit: 2014-01-25 | Discharge: 2014-01-25 | Disposition: A | Discharge status: Home / Self Care | Payer: Medicare Other | Source: Ambulatory Visit | Attending: Vascular Surgery | Admitting: Vascular Surgery

## 2014-01-25 ENCOUNTER — Encounter: Payer: Self-pay | Admitting: Vascular Surgery

## 2014-01-25 VITALS — BP 123/81 | HR 79 | Ht 66.0 in | Wt 192.0 lb

## 2014-01-25 DIAGNOSIS — Z8673 Personal history of transient ischemic attack (TIA), and cerebral infarction without residual deficits: Secondary | ICD-10-CM | POA: Insufficient documentation

## 2014-01-25 DIAGNOSIS — N186 End stage renal disease: Secondary | ICD-10-CM

## 2014-01-25 DIAGNOSIS — I25709 Atherosclerosis of coronary artery bypass graft(s), unspecified, with unspecified angina pectoris: Secondary | ICD-10-CM

## 2014-01-25 DIAGNOSIS — Z951 Presence of aortocoronary bypass graft: Secondary | ICD-10-CM | POA: Diagnosis not present

## 2014-01-25 DIAGNOSIS — I120 Hypertensive chronic kidney disease with stage 5 chronic kidney disease or end stage renal disease: Secondary | ICD-10-CM | POA: Insufficient documentation

## 2014-01-25 DIAGNOSIS — T82510A Breakdown (mechanical) of surgically created arteriovenous fistula, initial encounter: Secondary | ICD-10-CM | POA: Diagnosis not present

## 2014-01-25 DIAGNOSIS — I2510 Atherosclerotic heart disease of native coronary artery without angina pectoris: Secondary | ICD-10-CM | POA: Insufficient documentation

## 2014-01-25 DIAGNOSIS — Y838 Other surgical procedures as the cause of abnormal reaction of the patient, or of later complication, without mention of misadventure at the time of the procedure: Secondary | ICD-10-CM | POA: Insufficient documentation

## 2014-01-25 DIAGNOSIS — T82898A Other specified complication of vascular prosthetic devices, implants and grafts, initial encounter: Secondary | ICD-10-CM | POA: Insufficient documentation

## 2014-01-25 DIAGNOSIS — Z992 Dependence on renal dialysis: Secondary | ICD-10-CM | POA: Diagnosis not present

## 2014-01-25 NOTE — Progress Notes (Addendum)
Patient is a 43 year old male who presents for poorly functioning left arm AV fistula. Apparently the flows were decreased on dialysis recently. The patient is referred by Dr. Fausto SkillernBefakadu.  He currently dialyzes Monday Wednesday and Friday and [place]. [place] ago [place]. He states that he thinks the fistula may have clotted at one time during the past but this was a long time ago. He denies any numbness or tingling in his hand.  Past Medical History  Diagnosis Date  . Essential hypertension, benign   . Mixed hyperlipidemia   . ESRD on hemodialysis   . History of medication noncompliance   . Hypertensive heart disease   . History of stroke 2012    No ASD, PFO, SOE by TEE 01/2010  . Coronary atherosclerosis of native coronary artery     [facility], LVEF 70%    Past Surgical History  Procedure Laterality Date  . Insertion of dialysis catheter  01/02/2011    Procedure: INSERTION OF DIALYSIS CATHETER;  Surgeon: Pryor OchoaJames D Lawson, MD;  [facility];  Service: Vascular;  Laterality: N/A;  Insertion of Dialysis catheter Right Internal Jugular with 24cm dialysis catheter  . 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 amLODipine (NORVASC) 5 MG tablet Take 1 tablet (5 mg total) by mouth daily. 180 tablet 3  . aspirin EC 81 MG tablet Take 81 mg by mouth daily.    Marland Kitchen. atorvastatin (LIPITOR) 20 MG tablet Take 1 tablet (20 mg total) by mouth daily. 30 tablet 3  . carvedilol (COREG) 6.25 MG tablet Take 1 tablet (6.25 mg total) by mouth 2 (two) times daily. 180 tablet 3  . cloNIDine (CATAPRES) 0.2 MG tablet Take 0.2 mg by mouth 2 (two) times daily.    . diphenhydrAMINE (BENADRYL) 25 MG tablet Take 1 tablet (25 mg total) by mouth every 6 (six) hours. 20 tablet 0  . hydrALAZINE (APRESOLINE)  50 MG tablet Take 50 mg by mouth 2 (two) times daily.    Marland Kitchen. HYDROcodone-acetaminophen (NORCO) 5-325 MG per tablet Take 1 tablet by mouth every 6 (six) hours as needed for moderate pain. 6 tablet 0  . multivitamin (RENA-VIT) TABS tablet Take 1 tablet by mouth daily.      . sevelamer (RENAGEL) 800 MG tablet Take 2,400-3,200 mg by mouth 3 (three) times daily with meals. Takes 4 with meals and 3 with snacks     No current facility-administered medications on file prior to visit.     Review of systems: He denies shortness of breath. He denies chest pain.  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 There is also some intimal hyperplasia.  Assessment:  Possible inflow narrowing left arm AV fistula  Plan: Left arm fistulogram possible intervention by my partner Dr. Imogene Burnhen Thursday, January 7 to avoid the patient's dialysis today. Risks benefits possible palpitations and procedure details were explained  to the patient today including but not limited to bleeding infection vessel injury. There was also extended the patient that he will need a ride home after the procedure as he may receive sedation.  Fabienne Brunsharles Fields, MD [facility] [place] Office: [phone] Pager: [phone]

## 2014-01-26 DIAGNOSIS — Z992 Dependence on renal dialysis: Secondary | ICD-10-CM | POA: Diagnosis not present

## 2014-01-26 DIAGNOSIS — D631 Anemia in chronic kidney disease: Secondary | ICD-10-CM | POA: Diagnosis not present

## 2014-01-26 DIAGNOSIS — D509 Iron deficiency anemia, unspecified: Secondary | ICD-10-CM | POA: Diagnosis not present

## 2014-01-26 DIAGNOSIS — Z23 Encounter for immunization: Secondary | ICD-10-CM | POA: Diagnosis not present

## 2014-01-26 DIAGNOSIS — N2581 Secondary hyperparathyroidism of renal origin: Secondary | ICD-10-CM | POA: Diagnosis not present

## 2014-01-26 DIAGNOSIS — N186 End stage renal disease: Secondary | ICD-10-CM | POA: Diagnosis not present

## 2014-01-31 MED ORDER — SODIUM CHLORIDE 0.9 % IJ SOLN
3.0000 mL | INTRAMUSCULAR | Status: DC | PRN
  Administered 2014-02-01: 3 mL via INTRAVENOUS
  Filled 2014-01-31: qty 3

## 2014-02-01 ENCOUNTER — Encounter (HOSPITAL_COMMUNITY): Payer: Self-pay | Admitting: Vascular Surgery

## 2014-02-01 ENCOUNTER — Ambulatory Visit (HOSPITAL_COMMUNITY)
Admission: RE | Admit: 2014-02-01 | Discharge: 2014-02-01 | Disposition: A | Discharge status: Home / Self Care | Payer: Medicare Other | Source: Ambulatory Visit | Attending: Vascular Surgery | Admitting: Vascular Surgery

## 2014-02-01 ENCOUNTER — Encounter (HOSPITAL_COMMUNITY)
Admission: RE | Disposition: A | Discharge status: Home / Self Care | Payer: Self-pay | Source: Ambulatory Visit | Attending: Vascular Surgery

## 2014-02-01 DIAGNOSIS — I1311 Hypertensive heart and chronic kidney disease without heart failure, with stage 5 chronic kidney disease, or end stage renal disease: Secondary | ICD-10-CM | POA: Diagnosis not present

## 2014-02-01 DIAGNOSIS — Z951 Presence of aortocoronary bypass graft: Secondary | ICD-10-CM | POA: Diagnosis not present

## 2014-02-01 DIAGNOSIS — Z8673 Personal history of transient ischemic attack (TIA), and cerebral infarction without residual deficits: Secondary | ICD-10-CM | POA: Diagnosis not present

## 2014-02-01 DIAGNOSIS — N186 End stage renal disease: Secondary | ICD-10-CM | POA: Diagnosis not present

## 2014-02-01 DIAGNOSIS — E785 Hyperlipidemia, unspecified: Secondary | ICD-10-CM | POA: Insufficient documentation

## 2014-02-01 DIAGNOSIS — I871 Compression of vein: Secondary | ICD-10-CM | POA: Insufficient documentation

## 2014-02-01 DIAGNOSIS — Y69 Unspecified misadventure during surgical and medical care: Secondary | ICD-10-CM | POA: Insufficient documentation

## 2014-02-01 DIAGNOSIS — I2510 Atherosclerotic heart disease of native coronary artery without angina pectoris: Secondary | ICD-10-CM | POA: Insufficient documentation

## 2014-02-01 DIAGNOSIS — T82590A Other mechanical complication of surgically created arteriovenous fistula, initial encounter: Secondary | ICD-10-CM | POA: Insufficient documentation

## 2014-02-01 DIAGNOSIS — Z79899 Other long term (current) drug therapy: Secondary | ICD-10-CM | POA: Diagnosis not present

## 2014-02-01 DIAGNOSIS — Z9119 Patient's noncompliance with other medical treatment and regimen: Secondary | ICD-10-CM | POA: Diagnosis not present

## 2014-02-01 DIAGNOSIS — Z992 Dependence on renal dialysis: Secondary | ICD-10-CM

## 2014-02-01 DIAGNOSIS — Y929 Unspecified place or not applicable: Secondary | ICD-10-CM | POA: Diagnosis not present

## 2014-02-01 DIAGNOSIS — Z7982 Long term (current) use of aspirin: Secondary | ICD-10-CM | POA: Diagnosis not present

## 2014-02-01 DIAGNOSIS — T82898A Other specified complication of vascular prosthetic devices, implants and grafts, initial encounter: Secondary | ICD-10-CM | POA: Diagnosis not present

## 2014-02-01 HISTORY — PX: SHUNTOGRAM: SHX5491

## 2014-02-01 LAB — POCT I-STAT, CHEM 8
BUN: 55 mg/dL — ABNORMAL HIGH (ref 6–23)
Calcium, Ion: 1.09 mmol/L — ABNORMAL LOW (ref 1.12–1.23)
Chloride: 94 mEq/L — ABNORMAL LOW (ref 96–112)
Creatinine, Ser: 8.2 mg/dL — ABNORMAL HIGH (ref 0.50–1.35)
GLUCOSE: 106 mg/dL — AB (ref 70–99)
HCT: 42 % (ref 39.0–52.0)
HEMOGLOBIN: 14.3 g/dL (ref 13.0–17.0)
POTASSIUM: 3.9 mmol/L (ref 3.5–5.1)
Sodium: 137 mmol/L (ref 135–145)
TCO2: 29 mmol/L (ref 0–100)

## 2014-02-01 SURGERY — ASSESSMENT, SHUNT FUNCTION, WITH CONTRAST RADIOGRAPHIC STUDY
Anesthesia: LOCAL

## 2014-02-01 MED ORDER — SODIUM CHLORIDE 0.9 % IV SOLN
250.0000 mL | INTRAVENOUS | Status: DC | PRN
Start: 2014-02-01 — End: 2014-02-01

## 2014-02-01 MED ORDER — HEPARIN (PORCINE) IN NACL 2-0.9 UNIT/ML-% IJ SOLN
INTRAMUSCULAR | Status: AC
  Filled 2014-02-01: qty 500

## 2014-02-01 MED ORDER — LIDOCAINE HCL (PF) 1 % IJ SOLN
INTRAMUSCULAR | Status: AC
  Filled 2014-02-01: qty 30

## 2014-02-01 MED ORDER — SODIUM CHLORIDE 0.9 % IJ SOLN: 3.0000 mL | INTRAMUSCULAR | Status: DC | PRN

## 2014-02-01 MED ORDER — SODIUM CHLORIDE 0.9 % IJ SOLN: 3.0000 mL | Freq: Two times a day (BID) | INTRAMUSCULAR | Status: DC

## 2014-02-01 MED ORDER — ACETAMINOPHEN 325 MG PO TABS: 650.0000 mg | ORAL_TABLET | ORAL | Status: DC | PRN

## 2014-02-01 NOTE — Discharge Instructions (Signed)
Fistulogram, Care After °Refer to this sheet in the next few weeks. These instructions provide you with information on caring for yourself after your procedure. Your health care provider may also give you more specific instructions. Your treatment has been planned according to current medical practices, but problems sometimes occur. Call your health care provider if you have any problems or questions after your procedure. °WHAT TO EXPECT AFTER THE PROCEDURE °After your procedure, it is typical to have the following: °· A small amount of discomfort in the area where the catheters were placed. °· A small amount of bruising around the fistula. °· Sleepiness and fatigue. °HOME CARE INSTRUCTIONS °· Rest at home for the day following your procedure. °· Do not drive or operate heavy machinery while taking pain medicine. °· Take medicines only as directed by your health care provider. °· Do not take baths, swim, or use a hot tub until your health care provider approves. You may shower 24 hours after the procedure or as directed by your health care provider. °· There are many different ways to close and cover an incision, including stitches, skin glue, and adhesive strips. Follow your health care provider's instructions on: °¨ Incision care. °¨ Bandage (dressing) changes and removal. °¨ Incision closure removal. °· Monitor your dialysis fistula carefully. °SEEK MEDICAL CARE IF: °· You have drainage, redness, swelling, or pain at your catheter site. °· You have a fever. °· You have chills. °SEEK IMMEDIATE MEDICAL CARE IF: °· You feel weak. °· You have trouble balancing. °· You have trouble moving your arms or legs. °· You have problems with your speech or vision. °· You can no longer feel a vibration or buzz when you put your fingers over your dialysis fistula. °· The limb that was used for the procedure: °¨ Swells. °¨ Is painful. °¨ Is cold. °¨ Is discolored, such as blue or pale white. °Document Released: 05/29/2013  Document Reviewed: 03/03/2013 °Exit[employer]® Patient Information ©2015 [employer]. This information is not intended to replace advice given to you by your health care provider. Make sure you discuss any questions you have with your health care provider. ° °

## 2014-02-01 NOTE — Interval H&P Note (Signed)
[facility] [place]  History and Physical Update  The patient was interviewed and re-examined.  The patient's previous History and Physical has been reviewed and is unchanged from Dr. Evelina DunField's consult.  There is no change in the plan of care: Left arm fistulogram.  Leonides SakeBrian Oluwadarasimi Favor, MD [facility] Office: [phone] Pager: [phone]  02/01/2014, 7:12 AM

## 2014-02-01 NOTE — Op Note (Signed)
     OPERATIVE NOTE   PROCEDURE: 1.  left radiocephalic arteriovenous fistula cannulation under ultrasound guidance 2.  left arm fistulogram 3.  Venoplasty of left cephalic vein x 2 (4 mm x 40 mm, 6 mm x 80 mm)  PRE-OPERATIVE DIAGNOSIS: Malfunctioning left radiocephalic arteriovenous fistula  POST-OPERATIVE DIAGNOSIS: same as above   SURGEON: Adele Barthel, MD  ANESTHESIA: local  ESTIMATED BLOOD LOSS: 5 cc  FINDING(S): 1. Patent fistula from anastomosis to central venous system 2. Multiple stenoses in forearm segment: 2 <50% proximally (near resolved after serial venoplasty), >90% near anastomosis (2.5 mm to 5.1 mm after serial venoplasty)  SPECIMEN(S):  None  CONTRAST: 30 cc  INDICATIONS: Mario Alexander is a 44 y.o. male who presents with malfunctioning left radiocephalic arteriovenous fistula.  The patient is scheduled for left arm fistulogram, possible intervention.  The patient is aware the risks include but are not limited to: bleeding, infection, thrombosis of the cannulated access, and possible anaphylactic reaction to the contrast.  The patient is aware of the risks of the procedure and elects to proceed forward.  DESCRIPTION: After full informed written consent was obtained, the patient was brought back to the angiography suite and placed supine upon the angiography table.  The patient was connected to monitoring equipment.  The right forearm was prepped and draped in the standard fashion for a percutaneous access intervention.  Under ultrasound guidance, the right radiocephalic arteriovenous fistula was cannulated with a micropuncture needle.  The microwire was advanced into the fistula and the needle was exchanged for the a microsheath, which was lodged 2 cm into the access.  The wire was removed and the sheath was connected to the IV extension tubing.  Hand injections were completed to image the access from the antecubitum up to the level of axilla.  The central venous  structures were also imaged by hand injections.  Based on the images, this patient will need: attempt at venoplasty.  A glidewire was advanced through the forearm cephalic vein with some difficulty.  The sheath was exchanged for a short 6-Fr sheath.  Based on the imaging, a 4 mm x 40 mm angioplasty balloon was selected.  The balloon was centered around the distal stenosis and inflated to 10 ATM for 1 minute.  On completion imaging, a >30% residual stenosis was present.  At this point, the balloon was exchanged for a 6 mm x 80 mm angioplasty balloon.  The balloon was inflated to 10 ATM for 1 minute throughout the forearm cephalic vein.  I did not inflate to higher pressures due to pain expressed by the patient.  On completion imaging, a >30% residual stenosis was present.  I repeated this venoplasty at 10 atm for 2 minutes.  On the completion images, there was a 5.1 mm lumen present in the most stenotic segment so I felt no further intervention was necessary.  Based on the completion imaging, no further intervention is necessary.  The wire and balloon were removed from the sheath.  A 4-0 Monocryl purse-string suture was sewn around the sheath.  The sheath was removed while tying down the suture.  A sterile bandage was applied to the puncture site.  COMPLICATIONS: none  CONDITION: stable   Adele Barthel, MD [facility] [place] Office: [phone] Pager: [phone]  02/01/2014 8:29 AM

## 2014-02-01 NOTE — H&P (View-Only) (Signed)
Patient is a 44 year old male who presents for poorly functioning left arm AV fistula. Apparently the flows were decreased on dialysis recently. The patient is referred by Dr. Fausto SkillernBefakadu.  He currently dialyzes Monday Wednesday and Friday and [place]. [place] ago [place]. He states that he thinks the fistula may have clotted at one time during the past but this was a long time ago. He denies any numbness or tingling in his hand.  Past Medical History  Diagnosis Date  . Essential hypertension, benign   . Mixed hyperlipidemia   . ESRD on hemodialysis   . History of medication noncompliance   . Hypertensive heart disease   . History of stroke 2012    No ASD, PFO, SOE by TEE 01/2010  . Coronary atherosclerosis of native coronary artery     Multivessel status [facility] 07/[facility], LVEF 70%    Past Surgical History  Procedure Laterality Date  . Insertion of dialysis catheter  01/02/2011    Procedure: INSERTION OF DIALYSIS CATHETER;  Surgeon: Pryor OchoaJames D Lawson, MD;  [facility];  Service: Vascular;  Laterality: N/A;  Insertion of Dialysis catheter Right Internal Jugular with 24cm dialysis catheter  . 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 amLODipine (NORVASC) 5 MG tablet Take 1 tablet (5 mg total) by mouth daily. 180 tablet 3  . aspirin EC 81 MG tablet Take 81 mg by mouth daily.    Marland Kitchen. atorvastatin (LIPITOR) 20 MG tablet Take 1 tablet (20 mg total) by mouth daily. 30 tablet 3  . carvedilol (COREG) 6.25 MG tablet Take 1 tablet (6.25 mg total) by mouth 2 (two) times daily. 180 tablet 3  . cloNIDine (CATAPRES) 0.2 MG tablet Take 0.2 mg by mouth 2 (two) times daily.    . diphenhydrAMINE (BENADRYL) 25 MG tablet Take 1 tablet (25 mg total) by mouth every 6 (six) hours. 20 tablet 0  . hydrALAZINE (APRESOLINE)  50 MG tablet Take 50 mg by mouth 2 (two) times daily.    Marland Kitchen. HYDROcodone-acetaminophen (NORCO) 5-325 MG per tablet Take 1 tablet by mouth every 6 (six) hours as needed for moderate pain. 6 tablet 0  . multivitamin (RENA-VIT) TABS tablet Take 1 tablet by mouth daily.      . sevelamer (RENAGEL) 800 MG tablet Take 2,400-3,200 mg by mouth 3 (three) times daily with meals. Takes 4 with meals and 3 with snacks     No current facility-administered medications on file prior to visit.     Review of systems: He denies shortness of breath. He denies chest pain.  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 There is also some intimal hyperplasia.  Assessment:  Possible inflow narrowing left arm AV fistula  Plan: Left arm fistulogram possible intervention by my partner Dr. Imogene Burnhen Thursday, January 7 to avoid the patient's dialysis today. Risks benefits possible palpitations and procedure details were explained  to the patient today including but not limited to bleeding infection vessel injury. There was also extended the patient that he will need a ride home after the procedure as he may receive sedation.  Fabienne Brunsharles Jozelynn Danielson, MD [facility] [place] Office: [phone] Pager: [phone]

## 2014-02-25 DIAGNOSIS — N186 End stage renal disease: Secondary | ICD-10-CM | POA: Diagnosis not present

## 2014-02-25 DIAGNOSIS — Z992 Dependence on renal dialysis: Secondary | ICD-10-CM | POA: Diagnosis not present

## 2014-02-26 DIAGNOSIS — D631 Anemia in chronic kidney disease: Secondary | ICD-10-CM | POA: Diagnosis not present

## 2014-02-26 DIAGNOSIS — N2581 Secondary hyperparathyroidism of renal origin: Secondary | ICD-10-CM | POA: Diagnosis not present

## 2014-02-26 DIAGNOSIS — N186 End stage renal disease: Secondary | ICD-10-CM | POA: Diagnosis not present

## 2014-02-26 DIAGNOSIS — Z992 Dependence on renal dialysis: Secondary | ICD-10-CM | POA: Diagnosis not present

## 2014-02-26 DIAGNOSIS — D509 Iron deficiency anemia, unspecified: Secondary | ICD-10-CM | POA: Diagnosis not present

## 2014-02-28 DIAGNOSIS — N186 End stage renal disease: Secondary | ICD-10-CM | POA: Diagnosis not present

## 2014-02-28 DIAGNOSIS — D509 Iron deficiency anemia, unspecified: Secondary | ICD-10-CM | POA: Diagnosis not present

## 2014-02-28 DIAGNOSIS — Z992 Dependence on renal dialysis: Secondary | ICD-10-CM | POA: Diagnosis not present

## 2014-02-28 DIAGNOSIS — D631 Anemia in chronic kidney disease: Secondary | ICD-10-CM | POA: Diagnosis not present

## 2014-02-28 DIAGNOSIS — N2581 Secondary hyperparathyroidism of renal origin: Secondary | ICD-10-CM | POA: Diagnosis not present

## 2014-03-02 DIAGNOSIS — N186 End stage renal disease: Secondary | ICD-10-CM | POA: Diagnosis not present

## 2014-03-02 DIAGNOSIS — D509 Iron deficiency anemia, unspecified: Secondary | ICD-10-CM | POA: Diagnosis not present

## 2014-03-02 DIAGNOSIS — N2581 Secondary hyperparathyroidism of renal origin: Secondary | ICD-10-CM | POA: Diagnosis not present

## 2014-03-02 DIAGNOSIS — D631 Anemia in chronic kidney disease: Secondary | ICD-10-CM | POA: Diagnosis not present

## 2014-03-02 DIAGNOSIS — Z992 Dependence on renal dialysis: Secondary | ICD-10-CM | POA: Diagnosis not present

## 2014-03-05 DIAGNOSIS — D509 Iron deficiency anemia, unspecified: Secondary | ICD-10-CM | POA: Diagnosis not present

## 2014-03-05 DIAGNOSIS — N2581 Secondary hyperparathyroidism of renal origin: Secondary | ICD-10-CM | POA: Diagnosis not present

## 2014-03-05 DIAGNOSIS — D631 Anemia in chronic kidney disease: Secondary | ICD-10-CM | POA: Diagnosis not present

## 2014-03-05 DIAGNOSIS — N186 End stage renal disease: Secondary | ICD-10-CM | POA: Diagnosis not present

## 2014-03-05 DIAGNOSIS — Z992 Dependence on renal dialysis: Secondary | ICD-10-CM | POA: Diagnosis not present

## 2014-03-07 DIAGNOSIS — N186 End stage renal disease: Secondary | ICD-10-CM | POA: Diagnosis not present

## 2014-03-07 DIAGNOSIS — D631 Anemia in chronic kidney disease: Secondary | ICD-10-CM | POA: Diagnosis not present

## 2014-03-07 DIAGNOSIS — Z992 Dependence on renal dialysis: Secondary | ICD-10-CM | POA: Diagnosis not present

## 2014-03-07 DIAGNOSIS — D509 Iron deficiency anemia, unspecified: Secondary | ICD-10-CM | POA: Diagnosis not present

## 2014-03-07 DIAGNOSIS — N2581 Secondary hyperparathyroidism of renal origin: Secondary | ICD-10-CM | POA: Diagnosis not present

## 2014-03-09 DIAGNOSIS — N2581 Secondary hyperparathyroidism of renal origin: Secondary | ICD-10-CM | POA: Diagnosis not present

## 2014-03-09 DIAGNOSIS — D631 Anemia in chronic kidney disease: Secondary | ICD-10-CM | POA: Diagnosis not present

## 2014-03-09 DIAGNOSIS — N186 End stage renal disease: Secondary | ICD-10-CM | POA: Diagnosis not present

## 2014-03-09 DIAGNOSIS — Z992 Dependence on renal dialysis: Secondary | ICD-10-CM | POA: Diagnosis not present

## 2014-03-09 DIAGNOSIS — D509 Iron deficiency anemia, unspecified: Secondary | ICD-10-CM | POA: Diagnosis not present

## 2014-03-12 DIAGNOSIS — Z992 Dependence on renal dialysis: Secondary | ICD-10-CM | POA: Diagnosis not present

## 2014-03-12 DIAGNOSIS — D631 Anemia in chronic kidney disease: Secondary | ICD-10-CM | POA: Diagnosis not present

## 2014-03-12 DIAGNOSIS — N186 End stage renal disease: Secondary | ICD-10-CM | POA: Diagnosis not present

## 2014-03-12 DIAGNOSIS — N2581 Secondary hyperparathyroidism of renal origin: Secondary | ICD-10-CM | POA: Diagnosis not present

## 2014-03-12 DIAGNOSIS — D509 Iron deficiency anemia, unspecified: Secondary | ICD-10-CM | POA: Diagnosis not present

## 2014-03-14 DIAGNOSIS — N186 End stage renal disease: Secondary | ICD-10-CM | POA: Diagnosis not present

## 2014-03-14 DIAGNOSIS — D509 Iron deficiency anemia, unspecified: Secondary | ICD-10-CM | POA: Diagnosis not present

## 2014-03-14 DIAGNOSIS — D631 Anemia in chronic kidney disease: Secondary | ICD-10-CM | POA: Diagnosis not present

## 2014-03-14 DIAGNOSIS — N2581 Secondary hyperparathyroidism of renal origin: Secondary | ICD-10-CM | POA: Diagnosis not present

## 2014-03-14 DIAGNOSIS — Z992 Dependence on renal dialysis: Secondary | ICD-10-CM | POA: Diagnosis not present

## 2014-03-16 DIAGNOSIS — D509 Iron deficiency anemia, unspecified: Secondary | ICD-10-CM | POA: Diagnosis not present

## 2014-03-16 DIAGNOSIS — N186 End stage renal disease: Secondary | ICD-10-CM | POA: Diagnosis not present

## 2014-03-16 DIAGNOSIS — Z992 Dependence on renal dialysis: Secondary | ICD-10-CM | POA: Diagnosis not present

## 2014-03-16 DIAGNOSIS — D631 Anemia in chronic kidney disease: Secondary | ICD-10-CM | POA: Diagnosis not present

## 2014-03-16 DIAGNOSIS — N2581 Secondary hyperparathyroidism of renal origin: Secondary | ICD-10-CM | POA: Diagnosis not present

## 2014-03-19 DIAGNOSIS — Z992 Dependence on renal dialysis: Secondary | ICD-10-CM | POA: Diagnosis not present

## 2014-03-19 DIAGNOSIS — D509 Iron deficiency anemia, unspecified: Secondary | ICD-10-CM | POA: Diagnosis not present

## 2014-03-19 DIAGNOSIS — D631 Anemia in chronic kidney disease: Secondary | ICD-10-CM | POA: Diagnosis not present

## 2014-03-19 DIAGNOSIS — N2581 Secondary hyperparathyroidism of renal origin: Secondary | ICD-10-CM | POA: Diagnosis not present

## 2014-03-19 DIAGNOSIS — N186 End stage renal disease: Secondary | ICD-10-CM | POA: Diagnosis not present

## 2014-03-21 DIAGNOSIS — D509 Iron deficiency anemia, unspecified: Secondary | ICD-10-CM | POA: Diagnosis not present

## 2014-03-21 DIAGNOSIS — N186 End stage renal disease: Secondary | ICD-10-CM | POA: Diagnosis not present

## 2014-03-21 DIAGNOSIS — N2581 Secondary hyperparathyroidism of renal origin: Secondary | ICD-10-CM | POA: Diagnosis not present

## 2014-03-21 DIAGNOSIS — D631 Anemia in chronic kidney disease: Secondary | ICD-10-CM | POA: Diagnosis not present

## 2014-03-21 DIAGNOSIS — Z992 Dependence on renal dialysis: Secondary | ICD-10-CM | POA: Diagnosis not present

## 2014-03-23 DIAGNOSIS — N186 End stage renal disease: Secondary | ICD-10-CM | POA: Diagnosis not present

## 2014-03-23 DIAGNOSIS — N2581 Secondary hyperparathyroidism of renal origin: Secondary | ICD-10-CM | POA: Diagnosis not present

## 2014-03-23 DIAGNOSIS — Z992 Dependence on renal dialysis: Secondary | ICD-10-CM | POA: Diagnosis not present

## 2014-03-23 DIAGNOSIS — D509 Iron deficiency anemia, unspecified: Secondary | ICD-10-CM | POA: Diagnosis not present

## 2014-03-23 DIAGNOSIS — D631 Anemia in chronic kidney disease: Secondary | ICD-10-CM | POA: Diagnosis not present

## 2014-03-26 DIAGNOSIS — N186 End stage renal disease: Secondary | ICD-10-CM | POA: Diagnosis not present

## 2014-03-26 DIAGNOSIS — Z992 Dependence on renal dialysis: Secondary | ICD-10-CM | POA: Diagnosis not present

## 2014-03-26 DIAGNOSIS — D509 Iron deficiency anemia, unspecified: Secondary | ICD-10-CM | POA: Diagnosis not present

## 2014-03-26 DIAGNOSIS — D631 Anemia in chronic kidney disease: Secondary | ICD-10-CM | POA: Diagnosis not present

## 2014-03-26 DIAGNOSIS — N2581 Secondary hyperparathyroidism of renal origin: Secondary | ICD-10-CM | POA: Diagnosis not present

## 2014-03-28 DIAGNOSIS — D509 Iron deficiency anemia, unspecified: Secondary | ICD-10-CM | POA: Diagnosis not present

## 2014-03-28 DIAGNOSIS — Z992 Dependence on renal dialysis: Secondary | ICD-10-CM | POA: Diagnosis not present

## 2014-03-28 DIAGNOSIS — D631 Anemia in chronic kidney disease: Secondary | ICD-10-CM | POA: Diagnosis not present

## 2014-03-28 DIAGNOSIS — N186 End stage renal disease: Secondary | ICD-10-CM | POA: Diagnosis not present

## 2014-03-28 DIAGNOSIS — N2581 Secondary hyperparathyroidism of renal origin: Secondary | ICD-10-CM | POA: Diagnosis not present

## 2014-04-03 DIAGNOSIS — E785 Hyperlipidemia, unspecified: Secondary | ICD-10-CM | POA: Diagnosis not present

## 2014-04-04 ENCOUNTER — Other Ambulatory Visit: Payer: Self-pay

## 2014-04-04 LAB — LIPID PANEL
CHOLESTEROL: 269 mg/dL — AB (ref 0–200)
HDL: 43 mg/dL (ref >=40)
LDL CALC: 177 mg/dL — AB (ref 0–99)
Total CHOL/HDL Ratio: 6.3 Ratio
Triglycerides: 243 mg/dL — ABNORMAL HIGH (ref <150)
VLDL: 49 mg/dL — AB (ref 0–40)

## 2014-04-04 LAB — HEPATIC FUNCTION PANEL
ALT: 13 U/L (ref 0–53)
AST: 17 U/L (ref 0–37)
Albumin: 4.7 g/dL (ref 3.5–5.2)
Alkaline Phosphatase: 64 U/L (ref 39–117)
BILIRUBIN TOTAL: 0.4 mg/dL (ref 0.2–1.2)
Bilirubin, Direct: 0.1 mg/dL (ref 0.0–0.3)
Indirect Bilirubin: 0.3 mg/dL (ref 0.2–1.2)
Total Protein: 8.2 g/dL (ref 6.0–8.3)

## 2014-04-12 ENCOUNTER — Telehealth: Payer: Self-pay | Admitting: *Deleted

## 2014-04-12 NOTE — Telephone Encounter (Signed)
Patient notified of Lipid results. Patient states that he has not picked up or started taking his Lipitor. Patient encouraged taking.

## 2014-04-26 DIAGNOSIS — Z992 Dependence on renal dialysis: Secondary | ICD-10-CM | POA: Diagnosis not present

## 2014-04-26 DIAGNOSIS — N186 End stage renal disease: Secondary | ICD-10-CM | POA: Diagnosis not present

## 2014-04-27 DIAGNOSIS — D631 Anemia in chronic kidney disease: Secondary | ICD-10-CM | POA: Diagnosis not present

## 2014-04-27 DIAGNOSIS — D509 Iron deficiency anemia, unspecified: Secondary | ICD-10-CM | POA: Diagnosis not present

## 2014-04-27 DIAGNOSIS — N2581 Secondary hyperparathyroidism of renal origin: Secondary | ICD-10-CM | POA: Diagnosis not present

## 2014-04-27 DIAGNOSIS — Z992 Dependence on renal dialysis: Secondary | ICD-10-CM | POA: Diagnosis not present

## 2014-04-27 DIAGNOSIS — N186 End stage renal disease: Secondary | ICD-10-CM | POA: Diagnosis not present

## 2014-05-26 DIAGNOSIS — N186 End stage renal disease: Secondary | ICD-10-CM | POA: Diagnosis not present

## 2014-05-26 DIAGNOSIS — Z992 Dependence on renal dialysis: Secondary | ICD-10-CM | POA: Diagnosis not present

## 2014-05-28 DIAGNOSIS — N186 End stage renal disease: Secondary | ICD-10-CM | POA: Diagnosis not present

## 2014-05-28 DIAGNOSIS — E611 Iron deficiency: Secondary | ICD-10-CM | POA: Diagnosis not present

## 2014-05-28 DIAGNOSIS — Z992 Dependence on renal dialysis: Secondary | ICD-10-CM | POA: Diagnosis not present

## 2014-05-28 DIAGNOSIS — D631 Anemia in chronic kidney disease: Secondary | ICD-10-CM | POA: Diagnosis not present

## 2014-05-28 DIAGNOSIS — N2581 Secondary hyperparathyroidism of renal origin: Secondary | ICD-10-CM | POA: Diagnosis not present

## 2014-05-28 DIAGNOSIS — D509 Iron deficiency anemia, unspecified: Secondary | ICD-10-CM | POA: Diagnosis not present

## 2014-06-26 DIAGNOSIS — Z992 Dependence on renal dialysis: Secondary | ICD-10-CM | POA: Diagnosis not present

## 2014-06-26 DIAGNOSIS — N186 End stage renal disease: Secondary | ICD-10-CM | POA: Diagnosis not present

## 2014-06-27 DIAGNOSIS — Z992 Dependence on renal dialysis: Secondary | ICD-10-CM | POA: Diagnosis not present

## 2014-06-27 DIAGNOSIS — N2581 Secondary hyperparathyroidism of renal origin: Secondary | ICD-10-CM | POA: Diagnosis not present

## 2014-06-27 DIAGNOSIS — D631 Anemia in chronic kidney disease: Secondary | ICD-10-CM | POA: Diagnosis not present

## 2014-06-27 DIAGNOSIS — N186 End stage renal disease: Secondary | ICD-10-CM | POA: Diagnosis not present

## 2014-06-27 DIAGNOSIS — D509 Iron deficiency anemia, unspecified: Secondary | ICD-10-CM | POA: Diagnosis not present

## 2014-06-29 DIAGNOSIS — N186 End stage renal disease: Secondary | ICD-10-CM | POA: Diagnosis not present

## 2014-06-29 DIAGNOSIS — Z992 Dependence on renal dialysis: Secondary | ICD-10-CM | POA: Diagnosis not present

## 2014-06-29 DIAGNOSIS — D631 Anemia in chronic kidney disease: Secondary | ICD-10-CM | POA: Diagnosis not present

## 2014-06-29 DIAGNOSIS — D509 Iron deficiency anemia, unspecified: Secondary | ICD-10-CM | POA: Diagnosis not present

## 2014-06-29 DIAGNOSIS — N2581 Secondary hyperparathyroidism of renal origin: Secondary | ICD-10-CM | POA: Diagnosis not present

## 2014-07-02 DIAGNOSIS — N2581 Secondary hyperparathyroidism of renal origin: Secondary | ICD-10-CM | POA: Diagnosis not present

## 2014-07-02 DIAGNOSIS — D631 Anemia in chronic kidney disease: Secondary | ICD-10-CM | POA: Diagnosis not present

## 2014-07-02 DIAGNOSIS — D509 Iron deficiency anemia, unspecified: Secondary | ICD-10-CM | POA: Diagnosis not present

## 2014-07-02 DIAGNOSIS — N186 End stage renal disease: Secondary | ICD-10-CM | POA: Diagnosis not present

## 2014-07-02 DIAGNOSIS — Z992 Dependence on renal dialysis: Secondary | ICD-10-CM | POA: Diagnosis not present

## 2014-07-04 DIAGNOSIS — N2581 Secondary hyperparathyroidism of renal origin: Secondary | ICD-10-CM | POA: Diagnosis not present

## 2014-07-04 DIAGNOSIS — N186 End stage renal disease: Secondary | ICD-10-CM | POA: Diagnosis not present

## 2014-07-04 DIAGNOSIS — D631 Anemia in chronic kidney disease: Secondary | ICD-10-CM | POA: Diagnosis not present

## 2014-07-04 DIAGNOSIS — Z992 Dependence on renal dialysis: Secondary | ICD-10-CM | POA: Diagnosis not present

## 2014-07-04 DIAGNOSIS — D509 Iron deficiency anemia, unspecified: Secondary | ICD-10-CM | POA: Diagnosis not present

## 2014-07-06 DIAGNOSIS — D631 Anemia in chronic kidney disease: Secondary | ICD-10-CM | POA: Diagnosis not present

## 2014-07-06 DIAGNOSIS — N2581 Secondary hyperparathyroidism of renal origin: Secondary | ICD-10-CM | POA: Diagnosis not present

## 2014-07-06 DIAGNOSIS — D509 Iron deficiency anemia, unspecified: Secondary | ICD-10-CM | POA: Diagnosis not present

## 2014-07-06 DIAGNOSIS — Z992 Dependence on renal dialysis: Secondary | ICD-10-CM | POA: Diagnosis not present

## 2014-07-06 DIAGNOSIS — N186 End stage renal disease: Secondary | ICD-10-CM | POA: Diagnosis not present

## 2014-07-09 DIAGNOSIS — N186 End stage renal disease: Secondary | ICD-10-CM | POA: Diagnosis not present

## 2014-07-09 DIAGNOSIS — D509 Iron deficiency anemia, unspecified: Secondary | ICD-10-CM | POA: Diagnosis not present

## 2014-07-09 DIAGNOSIS — D631 Anemia in chronic kidney disease: Secondary | ICD-10-CM | POA: Diagnosis not present

## 2014-07-09 DIAGNOSIS — Z992 Dependence on renal dialysis: Secondary | ICD-10-CM | POA: Diagnosis not present

## 2014-07-09 DIAGNOSIS — N2581 Secondary hyperparathyroidism of renal origin: Secondary | ICD-10-CM | POA: Diagnosis not present

## 2014-07-11 DIAGNOSIS — N186 End stage renal disease: Secondary | ICD-10-CM | POA: Diagnosis not present

## 2014-07-11 DIAGNOSIS — Z992 Dependence on renal dialysis: Secondary | ICD-10-CM | POA: Diagnosis not present

## 2014-07-11 DIAGNOSIS — D631 Anemia in chronic kidney disease: Secondary | ICD-10-CM | POA: Diagnosis not present

## 2014-07-11 DIAGNOSIS — D509 Iron deficiency anemia, unspecified: Secondary | ICD-10-CM | POA: Diagnosis not present

## 2014-07-11 DIAGNOSIS — N2581 Secondary hyperparathyroidism of renal origin: Secondary | ICD-10-CM | POA: Diagnosis not present

## 2014-07-13 DIAGNOSIS — D509 Iron deficiency anemia, unspecified: Secondary | ICD-10-CM | POA: Diagnosis not present

## 2014-07-13 DIAGNOSIS — D631 Anemia in chronic kidney disease: Secondary | ICD-10-CM | POA: Diagnosis not present

## 2014-07-13 DIAGNOSIS — N186 End stage renal disease: Secondary | ICD-10-CM | POA: Diagnosis not present

## 2014-07-13 DIAGNOSIS — N2581 Secondary hyperparathyroidism of renal origin: Secondary | ICD-10-CM | POA: Diagnosis not present

## 2014-07-13 DIAGNOSIS — Z992 Dependence on renal dialysis: Secondary | ICD-10-CM | POA: Diagnosis not present

## 2014-07-16 DIAGNOSIS — N2581 Secondary hyperparathyroidism of renal origin: Secondary | ICD-10-CM | POA: Diagnosis not present

## 2014-07-16 DIAGNOSIS — N186 End stage renal disease: Secondary | ICD-10-CM | POA: Diagnosis not present

## 2014-07-16 DIAGNOSIS — Z992 Dependence on renal dialysis: Secondary | ICD-10-CM | POA: Diagnosis not present

## 2014-07-16 DIAGNOSIS — D631 Anemia in chronic kidney disease: Secondary | ICD-10-CM | POA: Diagnosis not present

## 2014-07-16 DIAGNOSIS — D509 Iron deficiency anemia, unspecified: Secondary | ICD-10-CM | POA: Diagnosis not present

## 2014-07-18 DIAGNOSIS — Z992 Dependence on renal dialysis: Secondary | ICD-10-CM | POA: Diagnosis not present

## 2014-07-18 DIAGNOSIS — D509 Iron deficiency anemia, unspecified: Secondary | ICD-10-CM | POA: Diagnosis not present

## 2014-07-18 DIAGNOSIS — D631 Anemia in chronic kidney disease: Secondary | ICD-10-CM | POA: Diagnosis not present

## 2014-07-18 DIAGNOSIS — N2581 Secondary hyperparathyroidism of renal origin: Secondary | ICD-10-CM | POA: Diagnosis not present

## 2014-07-18 DIAGNOSIS — N186 End stage renal disease: Secondary | ICD-10-CM | POA: Diagnosis not present

## 2014-07-20 DIAGNOSIS — D631 Anemia in chronic kidney disease: Secondary | ICD-10-CM | POA: Diagnosis not present

## 2014-07-20 DIAGNOSIS — N2581 Secondary hyperparathyroidism of renal origin: Secondary | ICD-10-CM | POA: Diagnosis not present

## 2014-07-20 DIAGNOSIS — Z992 Dependence on renal dialysis: Secondary | ICD-10-CM | POA: Diagnosis not present

## 2014-07-20 DIAGNOSIS — D509 Iron deficiency anemia, unspecified: Secondary | ICD-10-CM | POA: Diagnosis not present

## 2014-07-20 DIAGNOSIS — N186 End stage renal disease: Secondary | ICD-10-CM | POA: Diagnosis not present

## 2014-07-23 DIAGNOSIS — N186 End stage renal disease: Secondary | ICD-10-CM | POA: Diagnosis not present

## 2014-07-23 DIAGNOSIS — D631 Anemia in chronic kidney disease: Secondary | ICD-10-CM | POA: Diagnosis not present

## 2014-07-23 DIAGNOSIS — Z992 Dependence on renal dialysis: Secondary | ICD-10-CM | POA: Diagnosis not present

## 2014-07-23 DIAGNOSIS — D509 Iron deficiency anemia, unspecified: Secondary | ICD-10-CM | POA: Diagnosis not present

## 2014-07-23 DIAGNOSIS — N2581 Secondary hyperparathyroidism of renal origin: Secondary | ICD-10-CM | POA: Diagnosis not present

## 2014-07-25 DIAGNOSIS — D631 Anemia in chronic kidney disease: Secondary | ICD-10-CM | POA: Diagnosis not present

## 2014-07-25 DIAGNOSIS — N186 End stage renal disease: Secondary | ICD-10-CM | POA: Diagnosis not present

## 2014-07-25 DIAGNOSIS — Z992 Dependence on renal dialysis: Secondary | ICD-10-CM | POA: Diagnosis not present

## 2014-07-25 DIAGNOSIS — N2581 Secondary hyperparathyroidism of renal origin: Secondary | ICD-10-CM | POA: Diagnosis not present

## 2014-07-25 DIAGNOSIS — D509 Iron deficiency anemia, unspecified: Secondary | ICD-10-CM | POA: Diagnosis not present

## 2014-07-26 DIAGNOSIS — Z992 Dependence on renal dialysis: Secondary | ICD-10-CM | POA: Diagnosis not present

## 2014-07-26 DIAGNOSIS — N186 End stage renal disease: Secondary | ICD-10-CM | POA: Diagnosis not present

## 2014-07-27 DIAGNOSIS — D631 Anemia in chronic kidney disease: Secondary | ICD-10-CM | POA: Diagnosis not present

## 2014-07-27 DIAGNOSIS — D509 Iron deficiency anemia, unspecified: Secondary | ICD-10-CM | POA: Diagnosis not present

## 2014-07-27 DIAGNOSIS — N2581 Secondary hyperparathyroidism of renal origin: Secondary | ICD-10-CM | POA: Diagnosis not present

## 2014-07-27 DIAGNOSIS — N186 End stage renal disease: Secondary | ICD-10-CM | POA: Diagnosis not present

## 2014-07-27 DIAGNOSIS — Z992 Dependence on renal dialysis: Secondary | ICD-10-CM | POA: Diagnosis not present

## 2014-08-26 DIAGNOSIS — N186 End stage renal disease: Secondary | ICD-10-CM | POA: Diagnosis not present

## 2014-08-26 DIAGNOSIS — Z992 Dependence on renal dialysis: Secondary | ICD-10-CM | POA: Diagnosis not present

## 2014-08-27 DIAGNOSIS — D631 Anemia in chronic kidney disease: Secondary | ICD-10-CM | POA: Diagnosis not present

## 2014-08-27 DIAGNOSIS — N186 End stage renal disease: Secondary | ICD-10-CM | POA: Diagnosis not present

## 2014-08-27 DIAGNOSIS — N2581 Secondary hyperparathyroidism of renal origin: Secondary | ICD-10-CM | POA: Diagnosis not present

## 2014-08-27 DIAGNOSIS — D509 Iron deficiency anemia, unspecified: Secondary | ICD-10-CM | POA: Diagnosis not present

## 2014-08-27 DIAGNOSIS — Z992 Dependence on renal dialysis: Secondary | ICD-10-CM | POA: Diagnosis not present

## 2014-09-26 DIAGNOSIS — N186 End stage renal disease: Secondary | ICD-10-CM | POA: Diagnosis not present

## 2014-09-26 DIAGNOSIS — Z992 Dependence on renal dialysis: Secondary | ICD-10-CM | POA: Diagnosis not present

## 2014-09-28 DIAGNOSIS — N186 End stage renal disease: Secondary | ICD-10-CM | POA: Diagnosis not present

## 2014-09-28 DIAGNOSIS — Z992 Dependence on renal dialysis: Secondary | ICD-10-CM | POA: Diagnosis not present

## 2014-09-28 DIAGNOSIS — D631 Anemia in chronic kidney disease: Secondary | ICD-10-CM | POA: Diagnosis not present

## 2014-09-28 DIAGNOSIS — N2581 Secondary hyperparathyroidism of renal origin: Secondary | ICD-10-CM | POA: Diagnosis not present

## 2014-09-28 DIAGNOSIS — D509 Iron deficiency anemia, unspecified: Secondary | ICD-10-CM | POA: Diagnosis not present

## 2014-09-28 DIAGNOSIS — Z23 Encounter for immunization: Secondary | ICD-10-CM | POA: Diagnosis not present

## 2014-10-25 ENCOUNTER — Other Ambulatory Visit: Payer: Self-pay

## 2014-10-25 DIAGNOSIS — T82511A Breakdown (mechanical) of surgically created arteriovenous shunt, initial encounter: Secondary | ICD-10-CM

## 2014-10-26 DIAGNOSIS — N186 End stage renal disease: Secondary | ICD-10-CM | POA: Diagnosis not present

## 2014-10-26 DIAGNOSIS — Z992 Dependence on renal dialysis: Secondary | ICD-10-CM | POA: Diagnosis not present

## 2014-10-29 DIAGNOSIS — D631 Anemia in chronic kidney disease: Secondary | ICD-10-CM | POA: Diagnosis not present

## 2014-10-29 DIAGNOSIS — Z992 Dependence on renal dialysis: Secondary | ICD-10-CM | POA: Diagnosis not present

## 2014-10-29 DIAGNOSIS — N186 End stage renal disease: Secondary | ICD-10-CM | POA: Diagnosis not present

## 2014-10-29 DIAGNOSIS — D509 Iron deficiency anemia, unspecified: Secondary | ICD-10-CM | POA: Diagnosis not present

## 2014-10-29 DIAGNOSIS — N2581 Secondary hyperparathyroidism of renal origin: Secondary | ICD-10-CM | POA: Diagnosis not present

## 2014-10-30 ENCOUNTER — Ambulatory Visit (HOSPITAL_COMMUNITY)
Admission: RE | Admit: 2014-10-30 | Discharge: 2014-10-30 | Disposition: A | Discharge status: Home / Self Care | Payer: Medicare Other | Source: Ambulatory Visit | Attending: Vascular Surgery | Admitting: Vascular Surgery

## 2014-10-30 ENCOUNTER — Encounter: Payer: Self-pay | Admitting: Vascular Surgery

## 2014-10-30 DIAGNOSIS — Y838 Other surgical procedures as the cause of abnormal reaction of the patient, or of later complication, without mention of misadventure at the time of the procedure: Secondary | ICD-10-CM | POA: Diagnosis not present

## 2014-10-30 DIAGNOSIS — T82511A Breakdown (mechanical) of surgically created arteriovenous shunt, initial encounter: Secondary | ICD-10-CM

## 2014-10-30 DIAGNOSIS — T82511D Breakdown (mechanical) of surgically created arteriovenous shunt, subsequent encounter: Secondary | ICD-10-CM | POA: Diagnosis not present

## 2014-11-01 ENCOUNTER — Encounter: Payer: Self-pay | Admitting: *Deleted

## 2014-11-01 ENCOUNTER — Encounter: Payer: Self-pay | Admitting: Vascular Surgery

## 2014-11-01 ENCOUNTER — Other Ambulatory Visit: Payer: Self-pay | Admitting: *Deleted

## 2014-11-01 ENCOUNTER — Ambulatory Visit (INDEPENDENT_AMBULATORY_CARE_PROVIDER_SITE_OTHER): Payer: Medicare Other | Admitting: Vascular Surgery

## 2014-11-01 VITALS — BP 160/99 | HR 87 | Resp 9 | Ht 66.0 in | Wt 193.0 lb

## 2014-11-01 DIAGNOSIS — T829XXA Unspecified complication of cardiac and vascular prosthetic device, implant and graft, initial encounter: Secondary | ICD-10-CM

## 2014-11-01 NOTE — Progress Notes (Signed)
[facility] [place] HISTORY AND PHYSICAL   History of Present Illness:  Patient is a 44 y.o. 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 The patient has had this fistula for several years. He recently developed an erosion on the midportion of the fistula that has not healed. He denies any prior bleeding episodes. He is not on any anticoagulation other than aspirin. He denies any numbness or tingling in his hand.  His current dialysis today is Monday Wednesday Friday. Other medical problems include hyperlipidemia, hypertension, coronary artery disease all of which are currently stable.  Past Medical History  Diagnosis Date  . Essential hypertension, benign   . Mixed hyperlipidemia   . ESRD on hemodialysis (HCC)   . History of medication noncompliance   . Hypertensive heart disease   . History of stroke 2012    No ASD, PFO, SOE by TEE 01/2010  . Coronary atherosclerosis of native coronary artery     [facility]ivessel status [facility] 07/[facility], LVEF 70%  . CHF (congestive heart failure) (HCC)   . COPD (chronic obstructive pulmonary disease) (HCC)   . Peripheral vascular disease [facility](HCC)     Past Surgical History  Procedure Laterality Date  . Insertion of dialysis catheter  01/02/2011    Procedure: INSERTION OF DIALYSIS CATHETER;  Surgeon: Pryor OchoaJames D Lawson, MD;  [facility];  Service: Vascular;  Laterality: N/A;  Insertion of Dialysis catheter Right Internal Jugular with 24cm dialysis catheter  . Coronary artery bypass graft  07/2011    [facility] - LIMA to LAD, SVG to circumflex, SVG to ramus intermedius  . Shuntogram N/A 02/01/2014    Procedure: FISTULOGRAM;  Surgeon: Fransisco HertzBrian L Chen, MD;  [facility];  Service: Cardiovascular;  Laterality: N/A;    Social History Social History  Substance Use Topics  . Smoking status: Never Smoker   . Smokeless tobacco: None  . Alcohol Use: No    Family History Family  History  Problem Relation Age of Onset  . Diabetes Mother   . Hypertension Mother   . Hyperlipidemia Mother   . Heart disease Mother     before age 46  . COPD Father   . Lung cancer Father   . Cancer Father     Allergies  No Known Allergies   Current Outpatient Prescriptions  Medication Sig Dispense Refill  . cloNIDine (CATAPRES) 0.2 MG tablet Take 0.2 mg by mouth 2 (two) times daily.    . hydrALAZINE (APRESOLINE) 50 MG tablet Take 50 mg by mouth 2 (two) times daily.    Marland Kitchen. labetalol (NORMODYNE) 200 MG tablet Take 200 mg by mouth 2 (two) times daily.     . multivitamin (RENA-VIT) TABS tablet Take 1 tablet by mouth daily.      . sevelamer (RENAGEL) 800 MG tablet Take 2,400-3,200 mg by mouth 3 (three) times daily with meals. Takes 4 with meals and 3 with snacks    . sevelamer carbonate (RENVELA) 800 MG tablet Take 800 mg by mouth 3 (three) times daily with meals.    Marland Kitchen. amLODipine (NORVASC) 5 MG tablet Take 1 tablet (5 mg total) by mouth daily. (Patient not taking: Reported on 11/01/2014) 180 tablet 3  . aspirin EC 81 MG tablet Take 81 mg by mouth daily.    Marland Kitchen. atorvastatin (LIPITOR) 20 MG tablet Take 1 tablet (20 mg total) by mouth daily. (Patient not taking: Reported on 11/01/2014) 30 tablet 3  .  carvedilol (COREG) 6.25 MG tablet Take 1 tablet (6.25 mg total) by mouth 2 (two) times daily. (Patient not taking: Reported on 11/01/2014) 180 tablet 3  . HYDROcodone-acetaminophen (NORCO) 5-325 MG per tablet Take 1 tablet by mouth every 6 (six) hours as needed for moderate pain. (Patient not taking: Reported on 11/01/2014) 6 tablet 0   No current facility-administered medications for this visit.    ROS:   General:  No weight loss, Fever, chills  HEENT: No recent headaches, no nasal bleeding, no visual changes, no sore throat  Neurologic: No dizziness, blackouts, seizures. No recent symptoms of stroke or mini- stroke. No recent episodes of slurred speech, or temporary blindness.  Cardiac: No  recent episodes of chest pain/pressure, no shortness of breath at rest.  No shortness of breath with exertion.  Denies history of atrial fibrillation or irregular heartbeat  Vascular: No history of rest pain in feet.  No history of claudication.  No history of non-healing ulcer, No history of DVT   Pulmonary: No home oxygen, no productive cough, no hemoptysis,  + asthma or wheezing  Musculoskeletal:   Arthritis,  Low back pain,   Joint pain  Hematologic:No history of hypercoagulable state.  No history of easy bleeding.  No history of anemia  Gastrointestinal: No hematochezia or melena,  No gastroesophageal reflux, no trouble swallowing  Urinary:  chronic Kidney disease,  on HD -  MWF or  TTHS,  Burning with urination,  Frequent urination,  Difficulty urinating;   Skin: No rashes  Psychological: No history of anxiety,  No history of depression   Physical Examination  Filed Vitals:   11/01/14 1410 11/01/14 1419  BP: 170/98 160/99  Pulse: 87   Resp: 9   Height:  (1.676 m)   Weight: 193 lb (87.544 kg)   SpO2: 96%     Body mass index is 31.17 kg/(m^2).  General:  Alert and oriented, no acute distress HEENT: Normal Neck: No bruit or JVD Pulmonary: Clear to auscultation bilaterally Cardiac: Regular Rate and Rhythm  Skin: No rash, 4 mm full-thickness erosion midportion left radiocephalic AV fistula no visible clot some granulation tissue present, palpable thrill in fistula, the fistula has aneurysmal degeneration changes from the anastomotic area at the wrist to the mid forearm. Musculoskeletal: No deformity or edema  Neurologic: Upper and lower extremity motor 5/5 and symmetric   ASSESSMENT:  Aneurysmal degeneration left radiocephalic AV fistula with skin erosion at risk for bleeding   PLAN:  Revision left arm AV fistula with plication and placement of a Diatek catheter simultaneously on 11/13/2014 by my partner Dr. Imogene Burn. I discussed with the  patient today the procedure details as well as risks benefits and possible complications including not limited to bleeding infection fistula thrombosis possible requiring of other access procedures in the future. He understands and agrees to proceed.  Fabienne Bruns, MD [facility] [place] Office: [phone] Pager: [phone]

## 2014-11-02 ENCOUNTER — Other Ambulatory Visit: Payer: Self-pay

## 2014-11-07 ENCOUNTER — Encounter: Payer: Self-pay | Admitting: Nephrology

## 2014-11-07 ENCOUNTER — Encounter (HOSPITAL_COMMUNITY): Payer: Self-pay | Admitting: *Deleted

## 2014-11-07 MED ORDER — VANCOMYCIN HCL IN DEXTROSE 1-5 GM/200ML-% IV SOLN
1000.0000 mg | INTRAVENOUS | Status: AC
  Administered 2014-11-08: 1000 mg via INTRAVENOUS
  Filled 2014-11-07: qty 200

## 2014-11-07 MED ORDER — SODIUM CHLORIDE 0.9 % IV SOLN
INTRAVENOUS | Status: DC
  Administered 2014-11-08 (×2): via INTRAVENOUS

## 2014-11-07 MED ORDER — CHLORHEXIDINE GLUCONATE CLOTH 2 % EX PADS: 6.0000 | MEDICATED_PAD | Freq: Once | CUTANEOUS | Status: DC

## 2014-11-07 NOTE — Progress Notes (Signed)
Anesthesia Chart Review:  Pt is 44 year old male scheduled for revision plication of L AV fistula, insertion of dialysis catheter on 11/08/2014 with Dr. Edilia Boickson.   Pt is a same day work up.   PMH includes: CAD (s/p CABG 07/2011: LIMA to LAD, SVG to CX, SVG to ramus intermedius), CHF, PVD, HTN, stroke (2012), COPD, hyperlipidemia, ESRD. Never smoker. BMI 31.   Medications include: amlodipine, ASA, carvedilol, clonidine, hydralazine, labetolol.   I-stat 4 will be obtained DOS.   EKG will need to be obtained DOS.   Echo 08/10/2013:  - Left ventricle: The cavity size was normal. Wall thickness wasincreased in a pattern of severe LVH. Systolic function wasnormal. The estimated ejection fraction was in the range of 60%to 65%. Wall motion was normal; there were no regional wallmotion abnormalities. - Aortic valve: Mildly calcified annulus. Trileaflet. There was trivial regurgitation. - Mitral valve: Mildly calcified annulus. Mildly thickened leaflets - Left atrium: The atrium was mildly dilated. - Technically adequate study.  Nuclear stress test 08/10/2013: -normal stress test -LV EF 53%  Cardiologist is Dr. Nona DellSamuel McDowell, last office visit 09/19/13.   If no changes, I anticipate pt can proceed with surgery as scheduled.   Rica Mastngela Harim Bi, FNP-BC [facility]Anesthesiology Phone: [phone] 11/07/2014 1:23 PM

## 2014-11-07 NOTE — Progress Notes (Signed)
Pt denies SOB and chest pain but is under the care of Dr. Diona BrownerMcDowell, cardiology. Pt made aware to stop NSAID's, otc vitamins and herbal medications. Pt verbalized understanding of all pre-op instructions. Pt chart forwarded to [place] , anesthesia for review of cardiac history.

## 2014-11-08 ENCOUNTER — Ambulatory Visit (HOSPITAL_COMMUNITY)
Admission: RE | Admit: 2014-11-08 | Discharge: 2014-11-08 | Disposition: A | Discharge status: Home / Self Care | Payer: Medicare Other | Source: Ambulatory Visit | Attending: Vascular Surgery | Admitting: Vascular Surgery

## 2014-11-08 ENCOUNTER — Ambulatory Visit (HOSPITAL_COMMUNITY): Payer: Medicare Other

## 2014-11-08 ENCOUNTER — Ambulatory Visit (HOSPITAL_COMMUNITY): Payer: Medicare Other | Admitting: Emergency Medicine

## 2014-11-08 ENCOUNTER — Encounter (HOSPITAL_COMMUNITY)
Admission: RE | Disposition: A | Discharge status: Home / Self Care | Payer: Self-pay | Source: Ambulatory Visit | Attending: Vascular Surgery

## 2014-11-08 ENCOUNTER — Encounter (HOSPITAL_COMMUNITY): Payer: Self-pay | Admitting: General Practice

## 2014-11-08 DIAGNOSIS — I132 Hypertensive heart and chronic kidney disease with heart failure and with stage 5 chronic kidney disease, or end stage renal disease: Secondary | ICD-10-CM | POA: Insufficient documentation

## 2014-11-08 DIAGNOSIS — I739 Peripheral vascular disease, unspecified: Secondary | ICD-10-CM | POA: Insufficient documentation

## 2014-11-08 DIAGNOSIS — Z992 Dependence on renal dialysis: Secondary | ICD-10-CM | POA: Diagnosis not present

## 2014-11-08 DIAGNOSIS — Z8673 Personal history of transient ischemic attack (TIA), and cerebral infarction without residual deficits: Secondary | ICD-10-CM | POA: Diagnosis not present

## 2014-11-08 DIAGNOSIS — I2510 Atherosclerotic heart disease of native coronary artery without angina pectoris: Secondary | ICD-10-CM | POA: Insufficient documentation

## 2014-11-08 DIAGNOSIS — Z7982 Long term (current) use of aspirin: Secondary | ICD-10-CM | POA: Insufficient documentation

## 2014-11-08 DIAGNOSIS — Z951 Presence of aortocoronary bypass graft: Secondary | ICD-10-CM | POA: Insufficient documentation

## 2014-11-08 DIAGNOSIS — E782 Mixed hyperlipidemia: Secondary | ICD-10-CM | POA: Insufficient documentation

## 2014-11-08 DIAGNOSIS — Z4682 Encounter for fitting and adjustment of non-vascular catheter: Secondary | ICD-10-CM | POA: Diagnosis not present

## 2014-11-08 DIAGNOSIS — J449 Chronic obstructive pulmonary disease, unspecified: Secondary | ICD-10-CM | POA: Diagnosis not present

## 2014-11-08 DIAGNOSIS — I10 Essential (primary) hypertension: Secondary | ICD-10-CM | POA: Diagnosis not present

## 2014-11-08 DIAGNOSIS — I509 Heart failure, unspecified: Secondary | ICD-10-CM | POA: Diagnosis not present

## 2014-11-08 DIAGNOSIS — Z6831 Body mass index (BMI) 31.0-31.9, adult: Secondary | ICD-10-CM | POA: Diagnosis not present

## 2014-11-08 DIAGNOSIS — Z95828 Presence of other vascular implants and grafts: Secondary | ICD-10-CM

## 2014-11-08 DIAGNOSIS — Z9114 Patient's other noncompliance with medication regimen: Secondary | ICD-10-CM | POA: Diagnosis not present

## 2014-11-08 DIAGNOSIS — T82898A Other specified complication of vascular prosthetic devices, implants and grafts, initial encounter: Secondary | ICD-10-CM | POA: Diagnosis not present

## 2014-11-08 DIAGNOSIS — Z79899 Other long term (current) drug therapy: Secondary | ICD-10-CM | POA: Insufficient documentation

## 2014-11-08 DIAGNOSIS — Y832 Surgical operation with anastomosis, bypass or graft as the cause of abnormal reaction of the patient, or of later complication, without mention of misadventure at the time of the procedure: Secondary | ICD-10-CM | POA: Diagnosis not present

## 2014-11-08 DIAGNOSIS — Z419 Encounter for procedure for purposes other than remedying health state, unspecified: Secondary | ICD-10-CM

## 2014-11-08 DIAGNOSIS — N186 End stage renal disease: Secondary | ICD-10-CM | POA: Insufficient documentation

## 2014-11-08 HISTORY — PX: INSERTION OF DIALYSIS CATHETER: SHX1324

## 2014-11-08 HISTORY — PX: REVISON OF ARTERIOVENOUS FISTULA: SHX6074

## 2014-11-08 LAB — POCT I-STAT 4, (NA,K, GLUC, HGB,HCT)
GLUCOSE: 107 mg/dL — AB (ref 65–99)
HEMATOCRIT: 40 % (ref 39.0–52.0)
Hemoglobin: 13.6 g/dL (ref 13.0–17.0)
POTASSIUM: 4.1 mmol/L (ref 3.5–5.1)
SODIUM: 139 mmol/L (ref 135–145)

## 2014-11-08 SURGERY — REVISON OF ARTERIOVENOUS FISTULA
Anesthesia: Monitor Anesthesia Care | Site: Neck | Laterality: Right

## 2014-11-08 MED ORDER — MEPERIDINE HCL 25 MG/ML IJ SOLN: 6.2500 mg | INTRAMUSCULAR | Status: DC | PRN

## 2014-11-08 MED ORDER — PAPAVERINE HCL 30 MG/ML IJ SOLN
INTRAMUSCULAR | Status: AC
  Filled 2014-11-08: qty 2

## 2014-11-08 MED ORDER — HEPARIN SODIUM (PORCINE) 1000 UNIT/ML IJ SOLN
INTRAMUSCULAR | Status: DC | PRN
  Administered 2014-11-08: 7000 [IU] via INTRAVENOUS
  Administered 2014-11-08: 2000 [IU] via INTRAVENOUS

## 2014-11-08 MED ORDER — PROTAMINE SULFATE 10 MG/ML IV SOLN
INTRAVENOUS | Status: DC | PRN
  Administered 2014-11-08: 50 mg via INTRAVENOUS

## 2014-11-08 MED ORDER — SODIUM CHLORIDE 0.9 % IV SOLN
INTRAVENOUS | Status: DC | PRN
  Administered 2014-11-08: 500 mL

## 2014-11-08 MED ORDER — MIDAZOLAM HCL 5 MG/5ML IJ SOLN
INTRAMUSCULAR | Status: DC | PRN
  Administered 2014-11-08: 2 mg via INTRAVENOUS

## 2014-11-08 MED ORDER — PROPOFOL 10 MG/ML IV BOLUS
INTRAVENOUS | Status: AC
  Filled 2014-11-08: qty 20

## 2014-11-08 MED ORDER — LIDOCAINE-EPINEPHRINE (PF) 1 %-1:200000 IJ SOLN
INTRAMUSCULAR | Status: DC | PRN
  Administered 2014-11-08: 23 mL

## 2014-11-08 MED ORDER — HEPARIN SODIUM (PORCINE) 1000 UNIT/ML IJ SOLN
INTRAMUSCULAR | Status: DC | PRN
  Administered 2014-11-08: 1000 [IU]

## 2014-11-08 MED ORDER — PROMETHAZINE HCL 25 MG/ML IJ SOLN: 6.2500 mg | INTRAMUSCULAR | Status: DC | PRN

## 2014-11-08 MED ORDER — OXYCODONE-ACETAMINOPHEN 5-325 MG PO TABS: 1.0000 | ORAL_TABLET | ORAL | Status: DC | PRN

## 2014-11-08 MED ORDER — PROPOFOL 500 MG/50ML IV EMUL
INTRAVENOUS | Status: DC | PRN
  Administered 2014-11-08: 50 ug/kg/min via INTRAVENOUS

## 2014-11-08 MED ORDER — LIDOCAINE HCL (PF) 1 % IJ SOLN
INTRAMUSCULAR | Status: AC
  Filled 2014-11-08: qty 30

## 2014-11-08 MED ORDER — FENTANYL CITRATE (PF) 100 MCG/2ML IJ SOLN
INTRAMUSCULAR | Status: DC | PRN
  Administered 2014-11-08 (×4): 50 ug via INTRAVENOUS

## 2014-11-08 MED ORDER — LIDOCAINE-EPINEPHRINE (PF) 1 %-1:200000 IJ SOLN
INTRAMUSCULAR | Status: AC
  Filled 2014-11-08: qty 30

## 2014-11-08 MED ORDER — 0.9 % SODIUM CHLORIDE (POUR BTL) OPTIME
TOPICAL | Status: DC | PRN
  Administered 2014-11-08: 1000 mL

## 2014-11-08 MED ORDER — FENTANYL CITRATE (PF) 100 MCG/2ML IJ SOLN
INTRAMUSCULAR | Status: AC
  Filled 2014-11-08: qty 2

## 2014-11-08 MED ORDER — PROTAMINE SULFATE 10 MG/ML IV SOLN
INTRAVENOUS | Status: AC
  Filled 2014-11-08: qty 5

## 2014-11-08 MED ORDER — HEPARIN SODIUM (PORCINE) 1000 UNIT/ML IJ SOLN
INTRAMUSCULAR | Status: AC
  Filled 2014-11-08: qty 1

## 2014-11-08 MED ORDER — MIDAZOLAM HCL 2 MG/2ML IJ SOLN
INTRAMUSCULAR | Status: AC
  Filled 2014-11-08: qty 4

## 2014-11-08 MED ORDER — FENTANYL CITRATE (PF) 100 MCG/2ML IJ SOLN
25.0000 ug | INTRAMUSCULAR | Status: DC | PRN
  Administered 2014-11-08 (×4): 25 ug via INTRAVENOUS

## 2014-11-08 MED ORDER — MIDAZOLAM HCL 2 MG/2ML IJ SOLN: 0.5000 mg | Freq: Once | INTRAMUSCULAR | Status: DC | PRN

## 2014-11-08 MED ORDER — THROMBIN 20000 UNITS EX SOLR
CUTANEOUS | Status: AC
  Filled 2014-11-08: qty 20000

## 2014-11-08 MED ORDER — FENTANYL CITRATE (PF) 250 MCG/5ML IJ SOLN
INTRAMUSCULAR | Status: AC
  Filled 2014-11-08: qty 5

## 2014-11-08 SURGICAL SUPPLY — 60 items
BAG DECANTER FOR FLEXI CONT (MISCELLANEOUS) IMPLANT
BANDAGE ELASTIC 4 VELCRO ST LF (GAUZE/BANDAGES/DRESSINGS) ×4 IMPLANT
BANDAGE ESMARK 6X9 LF (GAUZE/BANDAGES/DRESSINGS) ×4 IMPLANT
BIOPATCH RED 1 DISK 7.0 (GAUZE/BANDAGES/DRESSINGS) ×3 IMPLANT
BIOPATCH RED 1IN DISK 7.0MM (GAUZE/BANDAGES/DRESSINGS) ×1
BLADE SURG 15 STRL LF DISP TIS (BLADE) ×4 IMPLANT
BLADE SURG 15 STRL SS (BLADE) ×4
BNDG ESMARK 6X9 LF (GAUZE/BANDAGES/DRESSINGS) ×8
CANISTER SUCTION 2500CC (MISCELLANEOUS) ×4 IMPLANT
CANNULA VESSEL 3MM 2 BLNT TIP (CANNULA) ×4 IMPLANT
CATH CANNON HEMO 15FR 23CM (HEMODIALYSIS SUPPLIES) ×4 IMPLANT
CHLORAPREP W/TINT 26ML (MISCELLANEOUS) ×4 IMPLANT
CLIP TI MEDIUM 6 (CLIP) ×4 IMPLANT
CLIP TI WIDE RED SMALL 6 (CLIP) ×4 IMPLANT
COVER PROBE W GEL 5X96 (DRAPES) ×8 IMPLANT
CUFF TOURNIQUET SINGLE 18IN (TOURNIQUET CUFF) ×4 IMPLANT
DRAPE C-ARM 42X72 X-RAY (DRAPES) ×4 IMPLANT
DRAPE CHEST BREAST 15X10 FENES (DRAPES) ×4 IMPLANT
ELECT REM PT RETURN 9FT ADLT (ELECTROSURGICAL) ×4
ELECTRODE REM PT RTRN 9FT ADLT (ELECTROSURGICAL) ×2 IMPLANT
GAUZE SPONGE 2X2 8PLY STRL LF (GAUZE/BANDAGES/DRESSINGS) ×2 IMPLANT
GAUZE SPONGE 4X4 16PLY XRAY LF (GAUZE/BANDAGES/DRESSINGS) ×4 IMPLANT
GLOVE BIO SURGEON STRL SZ 6.5 (GLOVE) ×6 IMPLANT
GLOVE BIO SURGEON STRL SZ7.5 (GLOVE) ×8 IMPLANT
GLOVE BIO SURGEONS STRL SZ 6.5 (GLOVE) ×2
GLOVE BIOGEL PI IND STRL 6.5 (GLOVE) ×8 IMPLANT
GLOVE BIOGEL PI IND STRL 7.5 (GLOVE) ×2 IMPLANT
GLOVE BIOGEL PI IND STRL 8 (GLOVE) ×4 IMPLANT
GLOVE BIOGEL PI INDICATOR 6.5 (GLOVE) ×8
GLOVE BIOGEL PI INDICATOR 7.5 (GLOVE) ×2
GLOVE BIOGEL PI INDICATOR 8 (GLOVE) ×4
GLOVE ECLIPSE 6.5 STRL STRAW (GLOVE) ×16 IMPLANT
GOWN STRL REUS W/ TWL LRG LVL3 (GOWN DISPOSABLE) ×10 IMPLANT
GOWN STRL REUS W/TWL LRG LVL3 (GOWN DISPOSABLE) ×10
KIT BASIN OR (CUSTOM PROCEDURE TRAY) ×4 IMPLANT
KIT ROOM TURNOVER OR (KITS) ×4 IMPLANT
LIQUID BAND (GAUZE/BANDAGES/DRESSINGS) ×4 IMPLANT
NEEDLE 18GX1X1/2 (RX/OR ONLY) (NEEDLE) ×4 IMPLANT
NEEDLE 22X1 1/2 (OR ONLY) (NEEDLE) IMPLANT
NEEDLE HYPO 25GX1X1/2 BEV (NEEDLE) IMPLANT
NS IRRIG 1000ML POUR BTL (IV SOLUTION) ×4 IMPLANT
PACK CV ACCESS (CUSTOM PROCEDURE TRAY) ×4 IMPLANT
PACK SURGICAL SETUP 50X90 (CUSTOM PROCEDURE TRAY) IMPLANT
PAD ARMBOARD 7.5X6 YLW CONV (MISCELLANEOUS) ×8 IMPLANT
SPONGE GAUZE 2X2 STER 10/PKG (GAUZE/BANDAGES/DRESSINGS) ×2
SPONGE GAUZE 4X4 12PLY STER LF (GAUZE/BANDAGES/DRESSINGS) ×4 IMPLANT
SPONGE SURGIFOAM ABS GEL 100 (HEMOSTASIS) IMPLANT
SUT ETHILON 3 0 PS 1 (SUTURE) ×12 IMPLANT
SUT PROLENE 5 0 C 1 24 (SUTURE) ×12 IMPLANT
SUT PROLENE 6 0 BV (SUTURE) ×4 IMPLANT
SUT VIC AB 3-0 SH 27 (SUTURE) ×2
SUT VIC AB 3-0 SH 27X BRD (SUTURE) ×2 IMPLANT
SUT VICRYL 4-0 PS2 18IN ABS (SUTURE) ×12 IMPLANT
SYR 20CC LL (SYRINGE) ×4 IMPLANT
SYR 5ML LL (SYRINGE) ×8 IMPLANT
SYR CONTROL 10ML LL (SYRINGE) IMPLANT
SYRINGE 10CC LL (SYRINGE) ×4 IMPLANT
TOWEL OR 17X24 6PK STRL BLUE (TOWEL DISPOSABLE) ×8 IMPLANT
UNDERPAD 30X30 INCONTINENT (UNDERPADS AND DIAPERS) ×4 IMPLANT
WATER STERILE IRR 1000ML POUR (IV SOLUTION) ×4 IMPLANT

## 2014-11-08 NOTE — Anesthesia Preprocedure Evaluation (Addendum)
Anesthesia Evaluation  Patient identified by MRN, date of birth, ID band Patient awake    Reviewed: Allergy & Precautions, NPO status , Patient's Chart, lab work & pertinent test results  History of Anesthesia Complications Negative for: history of anesthetic complications  Airway Mallampati: II  TM Distance: >3 FB Neck ROM: Full    Dental  (+) Chipped, Dental Advisory Given   Pulmonary neg pulmonary ROS,    breath sounds clear to auscultation       Cardiovascular hypertension, Pt. on medications (-) angina+ CAD and + CABG   Rhythm:Regular Rate:Normal  7/16 Stress: negative for ischemia, LVEF 53%.    Neuro/Psych negative neurological ROS     GI/Hepatic negative GI ROS, Neg liver ROS,   Endo/Other  negative endocrine ROSMorbid obesity  Renal/GU ESRF and DialysisRenal disease (K+ 4.1)     Musculoskeletal   Abdominal (+) + obese,   Peds  Hematology   Anesthesia Other Findings   Reproductive/Obstetrics                          Anesthesia Physical Anesthesia Plan  ASA: III  Anesthesia Plan: MAC   Post-op Pain Management:    Induction:   Airway Management Planned: Natural Airway and Simple Face Mask  Additional Equipment:   Intra-op Plan:   Post-operative Plan:   Informed Consent: I have reviewed the patients History and Physical, chart, labs and discussed the procedure including the risks, benefits and alternatives for the proposed anesthesia with the patient or authorized representative who has indicated his/her understanding and acceptance.   Dental advisory given  Plan Discussed with: CRNA and Surgeon  Anesthesia Plan Comments: (Plan routine monitors, MAC)        Anesthesia Quick Evaluation

## 2014-11-08 NOTE — Op Note (Signed)
    NAME: Mario Alexander    MRN: [id number] DOB: Aug 17, 1970    DATE OF OPERATION: 11/08/2014  PREOP DIAGNOSIS: Aneurysmal left radiocephalic AV fistula  POSTOP DIAGNOSIS: Same  PROCEDURE:  1. Ultrasound-guided placement of 23 cm tunneled dialysis catheter 2. Plication of 2 aneurysms of left radiocephalic AV fistula  SURGEON: Di Kindlehristopher S. Edilia Boickson, MD, FACS  ASSIST: Lianne CureMaureen Collins PA  ANESTHESIA: local with sedation   EBL: minimal  INDICATIONS: Mario Alexander is a 44 y.o. male who had 2 aneurysms of his AV fistula, one which was associated with an overlying ulcer. I was asked to plicate the aneurysms and place a catheter.  FINDINGS: Markedly degenerative left radiocephalic AV fistula with 2 large aneurysms which were plicated.  TECHNIQUE: The patient was taken to the operating room and sedated by anesthesia. The neck and upper chest were prepped and draped in usual sterile fashion. Under ultrasound guidance, after the skin was anesthetized, the right IJ was cannulated and a guidewire introduced into the superior vena cava under fluoroscopic control. The tract over the wire was dilated. The dilator and peel-away sheath were advanced over the wire and the wire and dilator removed. The catheter was passed through the peel-away sheath and positioned in the right atrium. The exit site for the cath was selected and the skin anesthetized between the 2 areas. The catheter was brought to the tunnel, cut to the appropriate length, and the distal ports were attached. Both ports withdrew easily with an flushed with heparinized saline and filled with concentrated heparin. The catheter was secured at its exit site with a 2-0 nylon suture. The IJ cannulation site was closed with a 4-0 subcuticular stitch. Sterile dressing was applied.  Attention was then turned to the left forearm. Left upper extremity was prepped and draped in usual sterile fashion. The initial aneurysm was addressed. This was the one with  an overlying ulcer. An elliptical incision was made over the aneurysm after the skin was anesthetized. Through dense scar tissue the aneurysm was dissected free anteriorly. Dissection was very difficult because of the scar tissue and its proximity to the skin. Once I dissected it free enough to be able to plicate the lateral wall the patient was heparinized. Turner is placed on the upper arm. The arm was exsanguinated with an Esmarch bandage and the tourniquet inflated to 250 mm of Mercury. Under tourniquet control the aneurysm was opened and an ellipse excised. This was then closed with a 2 layer closure with 5-0 Prolene. The tourniquet was then released. Attention was then turned to the adjacent aneurysm which was further centrally located. Elliptical incision was made over the aneurysm after the skin was anesthetized. Again the aneurysm was dissected free enough that I could plicate the lateral wall. The tourniquet was then again inflated to 250 mmHg and an ellipse of the aneurysm was excised. This was then closed again with 2 layers of 5-0 Prolene suture. The tourniquet was then released. Hemostasis was obtained in the wound and the heparin was partially reversed with protamine. Each of the wounds was closed with a single layer of 4-0 Vicryl. Sterile dressing was applied. The patient tolerated the procedure well and transferred to the recovery room in stable condition. All needle and sponge counts were correct.  Waverly Ferrarihristopher Dickson, MD, FACS [facility] [place]  DATE OF DICTATION:   11/08/2014

## 2014-11-08 NOTE — Interval H&P Note (Signed)
History and Physical Interval Note:  11/08/2014 1:07 PM  Joanie CoddingtonEric E Kyser  has presented today for surgery, with the diagnosis of pseudoaneurysm of left arm arteriovenous fistula  The various methods of treatment have been discussed with the patient and family. After consideration of risks, benefits and other options for treatment, the patient has consented to  Procedure(s): REVISION PLICATION OF ARTERIOVENOUS FISTULA (Left) INSERTION OF DIALYSIS CATHETER (N/A) as a surgical intervention .  The patient's history has been reviewed, patient examined, no change in status, stable for surgery.  I have reviewed the patient's chart and labs.  Questions were answered to the patient's satisfaction.     Waverly Ferrariickson, Christopher

## 2014-11-08 NOTE — Transfer of Care (Signed)
Immediate Anesthesia Transfer of Care Note  Patient: Mario Alexander  Procedure(s) Performed: Procedure(s): REVISION PLICATION OF LEFT ARM ARTERIOVENOUS FISTULA (Left) INSERTION OF DIALYSIS CATHETER RIGHT INTERNAL JUGULAR (Right)  Patient Location: PACU  Anesthesia Type:MAC  Level of Consciousness: awake  Airway & Oxygen Therapy: Patient Spontanous Breathing and Patient connected to nasal cannula oxygen  Post-op Assessment: Report given to RN and Post -op Vital signs reviewed and stable  Post vital signs: stable  Last Vitals:  Filed Vitals:   11/08/14 0926  BP: 171/84  Pulse: 78  Temp: 36.6 C  Resp: 16    Complications: No apparent anesthesia complications

## 2014-11-08 NOTE — Interval H&P Note (Signed)
History and Physical Interval Note:  11/08/2014 1:02 PM  Mario Alexander  has presented today for surgery, with the diagnosis of pseudoaneurysm of left arm arteriovenous fistula  The various methods of treatment have been discussed with the patient and family. After consideration of risks, benefits and other options for treatment, the patient has consented to  Procedure(s): REVISION PLICATION OF ARTERIOVENOUS FISTULA (Left) INSERTION OF DIALYSIS CATHETER (N/A) as a surgical intervention .  The patient's history has been reviewed, patient examined, no change in status, stable for surgery.  I have reviewed the patient's chart and labs.  Questions were answered to the patient's satisfaction.     Waverly Ferrariickson, [name]

## 2014-11-08 NOTE — Anesthesia Postprocedure Evaluation (Signed)
  Anesthesia Post-op Note  Patient: Joanie CoddingtonEric E Cardinal  Procedure(s) Performed: Procedure(s): REVISION PLICATION OF LEFT ARM ARTERIOVENOUS FISTULA (Left) INSERTION OF DIALYSIS CATHETER RIGHT INTERNAL JUGULAR (Right)  Patient Location: PACU  Anesthesia Type:MAC  Level of Consciousness: awake, alert , oriented and patient cooperative  Airway and Oxygen Therapy: Patient Spontanous Breathing  Post-op Pain: none  Post-op Assessment: Post-op Vital signs reviewed, Patient's Cardiovascular Status Stable, Respiratory Function Stable, Patent Airway, No signs of Nausea or vomiting and Pain level controlled              Post-op Vital Signs: Reviewed and stable  Last Vitals:  Filed Vitals:   11/08/14 0926  BP: 171/84  Pulse: 78  Temp: 36.6 C  Resp: 16    Complications: No apparent anesthesia complications

## 2014-11-08 NOTE — H&P (View-Only) (Signed)
[facility] [place] HISTORY AND PHYSICAL   History of Present Illness:  Patient is a 44 y.o. 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 The patient has had this fistula for several years. He recently developed an erosion on the midportion of the fistula that has not healed. He denies any prior bleeding episodes. He is not on any anticoagulation other than aspirin. He denies any numbness or tingling in his hand.  His current dialysis today is Monday Wednesday Friday. Other medical problems include hyperlipidemia, hypertension, coronary artery disease all of which are currently stable.  Past Medical History  Diagnosis Date  . Essential hypertension, benign   . Mixed hyperlipidemia   . ESRD on hemodialysis (HCC)   . History of medication noncompliance   . Hypertensive heart disease   . History of stroke 2012    No ASD, PFO, SOE by TEE 01/2010  . Coronary atherosclerosis of native coronary artery     Multivessel status [facility] 07/2011, LVEF 70%  . CHF (congestive heart failure) (HCC)   . COPD (chronic obstructive pulmonary disease) (HCC)   . Peripheral vascular disease [facility](HCC)     Past Surgical History  Procedure Laterality Date  . Insertion of dialysis catheter  01/02/2011    Procedure: INSERTION OF DIALYSIS CATHETER;  Surgeon: Pryor OchoaJames D Lawson, MD;  [facility];  Service: Vascular;  Laterality: N/A;  Insertion of Dialysis catheter Right Internal Jugular with 24cm dialysis catheter  . Coronary artery bypass graft  07/2011    [facility] - LIMA to LAD, SVG to circumflex, SVG to ramus intermedius  . Shuntogram N/A 02/01/2014    Procedure: FISTULOGRAM;  Surgeon: Fransisco HertzBrian L Chen, MD;  [facility];  Service: Cardiovascular;  Laterality: N/A;    Social History Social History  Substance Use Topics  . Smoking status: Never Smoker   . Smokeless tobacco: None  . Alcohol Use: No    Family History Family  History  Problem Relation Age of Onset  . Diabetes Mother   . Hypertension Mother   . Hyperlipidemia Mother   . Heart disease Mother     before age 46  . COPD Father   . Lung cancer Father   . Cancer Father     Allergies  No Known Allergies   Current Outpatient Prescriptions  Medication Sig Dispense Refill  . cloNIDine (CATAPRES) 0.2 MG tablet Take 0.2 mg by mouth 2 (two) times daily.    . hydrALAZINE (APRESOLINE) 50 MG tablet Take 50 mg by mouth 2 (two) times daily.    Marland Kitchen. labetalol (NORMODYNE) 200 MG tablet Take 200 mg by mouth 2 (two) times daily.     . multivitamin (RENA-VIT) TABS tablet Take 1 tablet by mouth daily.      . sevelamer (RENAGEL) 800 MG tablet Take 2,400-3,200 mg by mouth 3 (three) times daily with meals. Takes 4 with meals and 3 with snacks    . sevelamer carbonate (RENVELA) 800 MG tablet Take 800 mg by mouth 3 (three) times daily with meals.    Marland Kitchen. amLODipine (NORVASC) 5 MG tablet Take 1 tablet (5 mg total) by mouth daily. (Patient not taking: Reported on 11/01/2014) 180 tablet 3  . aspirin EC 81 MG tablet Take 81 mg by mouth daily.    Marland Kitchen. atorvastatin (LIPITOR) 20 MG tablet Take 1 tablet (20 mg total) by mouth daily. (Patient not taking: Reported on 11/01/2014) 30 tablet 3  .  carvedilol (COREG) 6.25 MG tablet Take 1 tablet (6.25 mg total) by mouth 2 (two) times daily. (Patient not taking: Reported on 11/01/2014) 180 tablet 3  . HYDROcodone-acetaminophen (NORCO) 5-325 MG per tablet Take 1 tablet by mouth every 6 (six) hours as needed for moderate pain. (Patient not taking: Reported on 11/01/2014) 6 tablet 0   No current facility-administered medications for this visit.    ROS:   General:  No weight loss, Fever, chills  HEENT: No recent headaches, no nasal bleeding, no visual changes, no sore throat  Neurologic: No dizziness, blackouts, seizures. No recent symptoms of stroke or mini- stroke. No recent episodes of slurred speech, or temporary blindness.  Cardiac: No  recent episodes of chest pain/pressure, no shortness of breath at rest.  No shortness of breath with exertion.  Denies history of atrial fibrillation or irregular heartbeat  Vascular: No history of rest pain in feet.  No history of claudication.  No history of non-healing ulcer, No history of DVT   Pulmonary: No home oxygen, no productive cough, no hemoptysis,  + asthma or wheezing  Musculoskeletal:   Arthritis,  Low back pain,   Joint pain  Hematologic:No history of hypercoagulable state.  No history of easy bleeding.  No history of anemia  Gastrointestinal: No hematochezia or melena,  No gastroesophageal reflux, no trouble swallowing  Urinary:  chronic Kidney disease,  on HD -  MWF or  TTHS,  Burning with urination,  Frequent urination,  Difficulty urinating;   Skin: No rashes  Psychological: No history of anxiety,  No history of depression   Physical Examination  Filed Vitals:   11/01/14 1410 11/01/14 1419  BP: 170/98 160/99  Pulse: 87   Resp: 9   Height:  (1.676 m)   Weight: 193 lb (87.544 kg)   SpO2: 96%     Body mass index is 31.17 kg/(m^2).  General:  Alert and oriented, no acute distress HEENT: Normal Neck: No bruit or JVD Pulmonary: Clear to auscultation bilaterally Cardiac: Regular Rate and Rhythm  Skin: No rash, 4 mm full-thickness erosion midportion left radiocephalic AV fistula no visible clot some granulation tissue present, palpable thrill in fistula, the fistula has aneurysmal degeneration changes from the anastomotic area at the wrist to the mid forearm. Musculoskeletal: No deformity or edema  Neurologic: Upper and lower extremity motor 5/5 and symmetric   ASSESSMENT:  Aneurysmal degeneration left radiocephalic AV fistula with skin erosion at risk for bleeding   PLAN:  Revision left arm AV fistula with plication and placement of a Diatek catheter simultaneously on 11/13/2014 by my partner Dr. Imogene Burn. I discussed with the  patient today the procedure details as well as risks benefits and possible complications including not limited to bleeding infection fistula thrombosis possible requiring of other access procedures in the future. He understands and agrees to proceed.  Fabienne Bruns, MD [facility] [place] Office: [phone] Pager: [phone]

## 2014-11-09 ENCOUNTER — Encounter (HOSPITAL_COMMUNITY): Payer: Self-pay | Admitting: Vascular Surgery

## 2014-11-22 ENCOUNTER — Encounter: Payer: Self-pay | Admitting: Vascular Surgery

## 2014-11-26 DIAGNOSIS — Z992 Dependence on renal dialysis: Secondary | ICD-10-CM | POA: Diagnosis not present

## 2014-11-26 DIAGNOSIS — N186 End stage renal disease: Secondary | ICD-10-CM | POA: Diagnosis not present

## 2014-11-27 ENCOUNTER — Other Ambulatory Visit: Payer: Self-pay

## 2014-11-27 ENCOUNTER — Ambulatory Visit (INDEPENDENT_AMBULATORY_CARE_PROVIDER_SITE_OTHER): Payer: Medicare Other | Admitting: Vascular Surgery

## 2014-11-27 ENCOUNTER — Encounter: Payer: Self-pay | Admitting: Vascular Surgery

## 2014-11-27 VITALS — BP 148/102 | HR 88 | Ht 66.0 in | Wt 186.5 lb

## 2014-11-27 DIAGNOSIS — N186 End stage renal disease: Secondary | ICD-10-CM

## 2014-11-27 DIAGNOSIS — Z992 Dependence on renal dialysis: Secondary | ICD-10-CM

## 2014-11-27 NOTE — Progress Notes (Signed)
She presents today for follow-up of left arm AV fistula. Had very of aneurysm and ulceration. Underwent plication of these areas by Dr. Edilia Boickson on 11/08/2014 also had placement of a tunneled catheter.  These incisions are healing quite nicely and he has an excellent thrill in his fistula. We will notify the dialysis center that it is acceptable to begin using the fistula again. Assuming he does well with dialysis on Wednesday Friday and next Monday, we will schedule removal of his catheter at [facility] on 12/04/2014

## 2014-11-28 ENCOUNTER — Encounter: Payer: Self-pay | Admitting: Vascular Surgery

## 2014-11-28 DIAGNOSIS — D631 Anemia in chronic kidney disease: Secondary | ICD-10-CM | POA: Diagnosis not present

## 2014-11-28 DIAGNOSIS — N2581 Secondary hyperparathyroidism of renal origin: Secondary | ICD-10-CM | POA: Diagnosis not present

## 2014-11-28 DIAGNOSIS — Z992 Dependence on renal dialysis: Secondary | ICD-10-CM | POA: Diagnosis not present

## 2014-11-28 DIAGNOSIS — D509 Iron deficiency anemia, unspecified: Secondary | ICD-10-CM | POA: Diagnosis not present

## 2014-11-28 DIAGNOSIS — N186 End stage renal disease: Secondary | ICD-10-CM | POA: Diagnosis not present

## 2014-11-30 DIAGNOSIS — D631 Anemia in chronic kidney disease: Secondary | ICD-10-CM | POA: Diagnosis not present

## 2014-11-30 DIAGNOSIS — N186 End stage renal disease: Secondary | ICD-10-CM | POA: Diagnosis not present

## 2014-11-30 DIAGNOSIS — Z992 Dependence on renal dialysis: Secondary | ICD-10-CM | POA: Diagnosis not present

## 2014-11-30 DIAGNOSIS — D509 Iron deficiency anemia, unspecified: Secondary | ICD-10-CM | POA: Diagnosis not present

## 2014-11-30 DIAGNOSIS — N2581 Secondary hyperparathyroidism of renal origin: Secondary | ICD-10-CM | POA: Diagnosis not present

## 2014-12-03 ENCOUNTER — Other Ambulatory Visit (HOSPITAL_COMMUNITY): Payer: Self-pay | Admitting: *Deleted

## 2014-12-03 DIAGNOSIS — N2581 Secondary hyperparathyroidism of renal origin: Secondary | ICD-10-CM | POA: Diagnosis not present

## 2014-12-03 DIAGNOSIS — N186 End stage renal disease: Secondary | ICD-10-CM | POA: Diagnosis not present

## 2014-12-03 DIAGNOSIS — D509 Iron deficiency anemia, unspecified: Secondary | ICD-10-CM | POA: Diagnosis not present

## 2014-12-03 DIAGNOSIS — D631 Anemia in chronic kidney disease: Secondary | ICD-10-CM | POA: Diagnosis not present

## 2014-12-03 DIAGNOSIS — Z992 Dependence on renal dialysis: Secondary | ICD-10-CM | POA: Diagnosis not present

## 2014-12-04 ENCOUNTER — Encounter (HOSPITAL_COMMUNITY)
Admission: RE | Admit: 2014-12-04 | Discharge: 2014-12-04 | Disposition: A | Discharge status: Home / Self Care | Payer: Medicare Other | Source: Ambulatory Visit | Attending: Vascular Surgery | Admitting: Vascular Surgery

## 2014-12-04 DIAGNOSIS — E782 Mixed hyperlipidemia: Secondary | ICD-10-CM | POA: Insufficient documentation

## 2014-12-04 DIAGNOSIS — Z452 Encounter for adjustment and management of vascular access device: Secondary | ICD-10-CM | POA: Insufficient documentation

## 2014-12-04 DIAGNOSIS — Z8249 Family history of ischemic heart disease and other diseases of the circulatory system: Secondary | ICD-10-CM | POA: Insufficient documentation

## 2014-12-04 DIAGNOSIS — Z825 Family history of asthma and other chronic lower respiratory diseases: Secondary | ICD-10-CM | POA: Diagnosis not present

## 2014-12-04 DIAGNOSIS — I120 Hypertensive chronic kidney disease with stage 5 chronic kidney disease or end stage renal disease: Secondary | ICD-10-CM | POA: Insufficient documentation

## 2014-12-04 DIAGNOSIS — I739 Peripheral vascular disease, unspecified: Secondary | ICD-10-CM | POA: Insufficient documentation

## 2014-12-04 DIAGNOSIS — N186 End stage renal disease: Secondary | ICD-10-CM | POA: Diagnosis not present

## 2014-12-04 DIAGNOSIS — Z951 Presence of aortocoronary bypass graft: Secondary | ICD-10-CM | POA: Insufficient documentation

## 2014-12-04 DIAGNOSIS — Z8673 Personal history of transient ischemic attack (TIA), and cerebral infarction without residual deficits: Secondary | ICD-10-CM | POA: Diagnosis not present

## 2014-12-04 DIAGNOSIS — I2510 Atherosclerotic heart disease of native coronary artery without angina pectoris: Secondary | ICD-10-CM | POA: Insufficient documentation

## 2014-12-04 DIAGNOSIS — Z79899 Other long term (current) drug therapy: Secondary | ICD-10-CM | POA: Diagnosis not present

## 2014-12-04 DIAGNOSIS — J449 Chronic obstructive pulmonary disease, unspecified: Secondary | ICD-10-CM | POA: Diagnosis not present

## 2014-12-04 DIAGNOSIS — Z833 Family history of diabetes mellitus: Secondary | ICD-10-CM | POA: Insufficient documentation

## 2014-12-04 NOTE — Progress Notes (Signed)
[facility] SHORT STAY H&P  CC: ESRD   PROCEDURE: 11/08/2014 1. Ultrasound-guided placement of 23 cm tunneled dialysis catheter 2. Plication of 2 aneurysms of left radiocephalic AV fistula  HPI: Joanie Coddingtonric E Enochs is a 44 y.o. male who has been on HD through  functioning Hemodialysis access in the left lower extremity. They are here for HD catheter removal. Pt. denies signs of steal syndrome.  Past Medical History  Diagnosis Date  . Essential hypertension, benign   . Mixed hyperlipidemia   . ESRD on hemodialysis (HCC)   . History of medication noncompliance   . Hypertensive heart disease   . History of stroke 2012    No ASD, PFO, SOE by TEE 01/2010  . Coronary atherosclerosis of native coronary artery     [facility]ivessel [facility], LVEF 70%  . CHF (congestive heart failure) (HCC)   . COPD (chronic obstructive pulmonary disease) (HCC)   . Peripheral vascular disease (HCC)     Family Hx Family History  Problem Relation Age of Onset  . Diabetes Mother   . Hypertension Mother   . Hyperlipidemia Mother   . Heart disease Mother     before age 46  . COPD Father   . Lung cancer Father   . Cancer Father     Social HX Social History  Substance Use Topics  . Smoking status: Never Smoker   . Smokeless tobacco: Never Used  . Alcohol Use: No    Allergies No Known Allergies  Medications Current Outpatient Prescriptions  Medication Sig Dispense Refill  . cloNIDine (CATAPRES) 0.2 MG tablet Take 0.2 mg by mouth 2 (two) times daily.    . hydrALAZINE (APRESOLINE) 50 MG tablet Take 50 mg by mouth 2 (two) times daily.    Marland Kitchen. labetalol (NORMODYNE) 200 MG tablet Take 200 mg by mouth 2 (two) times daily.     . multivitamin (RENA-VIT) TABS tablet Take 1 tablet by mouth 3 (three) times daily.     Marland Kitchen. oxyCODONE-acetaminophen (ROXICET) 5-325 MG tablet Take 1-2 tablets by mouth every 4 (four) hours as needed for severe pain. 30 tablet 0  . sevelamer carbonate  (RENVELA) 800 MG tablet Take 800 mg by mouth 3 (three) times daily with meals.     No current facility-administered medications for this encounter.    Labs COAG Lab Results  Component Value Date   INR 1.04 01/27/2010   INR 1.09 01/24/2010   INR 1.1 05/20/2007   No results found for: PTT  PHYSICAL EXAM  There were no vitals filed for this visit.  General:  WDWN in NAD HENT: WNL Eyes: Pupils equal Pulmonary: normal non-labored breathing  Cardiac: RRR, Skin: normal, no cyanosis, jaundice, pallor or bruising Vascular Exam/Pulses: 2+  pulses in LEFT lower extremity. Extremities without ischemic changes, no Gangrene , no cellulitis; no open wounds;   There is a good thrill and good bruit in the left forearm fistula. Hand grip is 5/5 and sensation in digits is intact;   Impression: This is a 44 y.o. male who has a functioning HD access.  Plan: Removal of Right IJ HD catheter Thomasena EdisCOLLINS, Monasia Lair [facility] 12/04/2014 9:34 AM   [facility] Catheter Removal Procedure Note  Diagnosis: ESRD with Functioning AVF/AVGG  Plan:  Remove right diatek catheter  Consent signed:  yes Time out completed:  yes Coumadin:  No. PT/INR (if applicable):   Other labs:   Procedure: 1.  Sterile prepping and draping over catheter  area 2. 0 ml 2% lidocaine plain instilled at removal site. 3.  right catheter removed in its entirety with cuff in tact. 4.  Complications: none  5. Tip of catheter sent for culture:  no   Patient tolerated procedure well:  yes Pressure held, no bleeding noted, dressing applied Instructions given to the pt regarding wound care and bleeding.  OtherClinton Gallant [facility] 12/04/2014 9:34 AM

## 2014-12-05 DIAGNOSIS — N186 End stage renal disease: Secondary | ICD-10-CM | POA: Diagnosis not present

## 2014-12-05 DIAGNOSIS — D631 Anemia in chronic kidney disease: Secondary | ICD-10-CM | POA: Diagnosis not present

## 2014-12-05 DIAGNOSIS — Z992 Dependence on renal dialysis: Secondary | ICD-10-CM | POA: Diagnosis not present

## 2014-12-05 DIAGNOSIS — D509 Iron deficiency anemia, unspecified: Secondary | ICD-10-CM | POA: Diagnosis not present

## 2014-12-05 DIAGNOSIS — N2581 Secondary hyperparathyroidism of renal origin: Secondary | ICD-10-CM | POA: Diagnosis not present

## 2014-12-07 DIAGNOSIS — D631 Anemia in chronic kidney disease: Secondary | ICD-10-CM | POA: Diagnosis not present

## 2014-12-07 DIAGNOSIS — Z992 Dependence on renal dialysis: Secondary | ICD-10-CM | POA: Diagnosis not present

## 2014-12-07 DIAGNOSIS — D509 Iron deficiency anemia, unspecified: Secondary | ICD-10-CM | POA: Diagnosis not present

## 2014-12-07 DIAGNOSIS — N186 End stage renal disease: Secondary | ICD-10-CM | POA: Diagnosis not present

## 2014-12-07 DIAGNOSIS — N2581 Secondary hyperparathyroidism of renal origin: Secondary | ICD-10-CM | POA: Diagnosis not present

## 2014-12-10 DIAGNOSIS — N186 End stage renal disease: Secondary | ICD-10-CM | POA: Diagnosis not present

## 2014-12-10 DIAGNOSIS — D509 Iron deficiency anemia, unspecified: Secondary | ICD-10-CM | POA: Diagnosis not present

## 2014-12-10 DIAGNOSIS — D631 Anemia in chronic kidney disease: Secondary | ICD-10-CM | POA: Diagnosis not present

## 2014-12-10 DIAGNOSIS — Z992 Dependence on renal dialysis: Secondary | ICD-10-CM | POA: Diagnosis not present

## 2014-12-10 DIAGNOSIS — N2581 Secondary hyperparathyroidism of renal origin: Secondary | ICD-10-CM | POA: Diagnosis not present

## 2014-12-12 DIAGNOSIS — N186 End stage renal disease: Secondary | ICD-10-CM | POA: Diagnosis not present

## 2014-12-12 DIAGNOSIS — D631 Anemia in chronic kidney disease: Secondary | ICD-10-CM | POA: Diagnosis not present

## 2014-12-12 DIAGNOSIS — D509 Iron deficiency anemia, unspecified: Secondary | ICD-10-CM | POA: Diagnosis not present

## 2014-12-12 DIAGNOSIS — Z992 Dependence on renal dialysis: Secondary | ICD-10-CM | POA: Diagnosis not present

## 2014-12-12 DIAGNOSIS — N2581 Secondary hyperparathyroidism of renal origin: Secondary | ICD-10-CM | POA: Diagnosis not present

## 2014-12-14 DIAGNOSIS — D509 Iron deficiency anemia, unspecified: Secondary | ICD-10-CM | POA: Diagnosis not present

## 2014-12-14 DIAGNOSIS — N186 End stage renal disease: Secondary | ICD-10-CM | POA: Diagnosis not present

## 2014-12-14 DIAGNOSIS — Z992 Dependence on renal dialysis: Secondary | ICD-10-CM | POA: Diagnosis not present

## 2014-12-14 DIAGNOSIS — N2581 Secondary hyperparathyroidism of renal origin: Secondary | ICD-10-CM | POA: Diagnosis not present

## 2014-12-14 DIAGNOSIS — D631 Anemia in chronic kidney disease: Secondary | ICD-10-CM | POA: Diagnosis not present

## 2014-12-17 DIAGNOSIS — D631 Anemia in chronic kidney disease: Secondary | ICD-10-CM | POA: Diagnosis not present

## 2014-12-17 DIAGNOSIS — N2581 Secondary hyperparathyroidism of renal origin: Secondary | ICD-10-CM | POA: Diagnosis not present

## 2014-12-17 DIAGNOSIS — N186 End stage renal disease: Secondary | ICD-10-CM | POA: Diagnosis not present

## 2014-12-17 DIAGNOSIS — D509 Iron deficiency anemia, unspecified: Secondary | ICD-10-CM | POA: Diagnosis not present

## 2014-12-17 DIAGNOSIS — Z992 Dependence on renal dialysis: Secondary | ICD-10-CM | POA: Diagnosis not present

## 2014-12-19 DIAGNOSIS — N2581 Secondary hyperparathyroidism of renal origin: Secondary | ICD-10-CM | POA: Diagnosis not present

## 2014-12-19 DIAGNOSIS — N186 End stage renal disease: Secondary | ICD-10-CM | POA: Diagnosis not present

## 2014-12-19 DIAGNOSIS — D631 Anemia in chronic kidney disease: Secondary | ICD-10-CM | POA: Diagnosis not present

## 2014-12-19 DIAGNOSIS — Z992 Dependence on renal dialysis: Secondary | ICD-10-CM | POA: Diagnosis not present

## 2014-12-19 DIAGNOSIS — D509 Iron deficiency anemia, unspecified: Secondary | ICD-10-CM | POA: Diagnosis not present

## 2014-12-21 DIAGNOSIS — N2581 Secondary hyperparathyroidism of renal origin: Secondary | ICD-10-CM | POA: Diagnosis not present

## 2014-12-21 DIAGNOSIS — D509 Iron deficiency anemia, unspecified: Secondary | ICD-10-CM | POA: Diagnosis not present

## 2014-12-21 DIAGNOSIS — D631 Anemia in chronic kidney disease: Secondary | ICD-10-CM | POA: Diagnosis not present

## 2014-12-21 DIAGNOSIS — N186 End stage renal disease: Secondary | ICD-10-CM | POA: Diagnosis not present

## 2014-12-21 DIAGNOSIS — Z992 Dependence on renal dialysis: Secondary | ICD-10-CM | POA: Diagnosis not present

## 2014-12-24 DIAGNOSIS — N2581 Secondary hyperparathyroidism of renal origin: Secondary | ICD-10-CM | POA: Diagnosis not present

## 2014-12-24 DIAGNOSIS — D509 Iron deficiency anemia, unspecified: Secondary | ICD-10-CM | POA: Diagnosis not present

## 2014-12-24 DIAGNOSIS — N186 End stage renal disease: Secondary | ICD-10-CM | POA: Diagnosis not present

## 2014-12-24 DIAGNOSIS — Z992 Dependence on renal dialysis: Secondary | ICD-10-CM | POA: Diagnosis not present

## 2014-12-24 DIAGNOSIS — D631 Anemia in chronic kidney disease: Secondary | ICD-10-CM | POA: Diagnosis not present

## 2014-12-26 DIAGNOSIS — D509 Iron deficiency anemia, unspecified: Secondary | ICD-10-CM | POA: Diagnosis not present

## 2014-12-26 DIAGNOSIS — N186 End stage renal disease: Secondary | ICD-10-CM | POA: Diagnosis not present

## 2014-12-26 DIAGNOSIS — D631 Anemia in chronic kidney disease: Secondary | ICD-10-CM | POA: Diagnosis not present

## 2014-12-26 DIAGNOSIS — Z992 Dependence on renal dialysis: Secondary | ICD-10-CM | POA: Diagnosis not present

## 2014-12-26 DIAGNOSIS — N2581 Secondary hyperparathyroidism of renal origin: Secondary | ICD-10-CM | POA: Diagnosis not present

## 2014-12-28 DIAGNOSIS — N2581 Secondary hyperparathyroidism of renal origin: Secondary | ICD-10-CM | POA: Diagnosis not present

## 2014-12-28 DIAGNOSIS — N186 End stage renal disease: Secondary | ICD-10-CM | POA: Diagnosis not present

## 2014-12-28 DIAGNOSIS — D631 Anemia in chronic kidney disease: Secondary | ICD-10-CM | POA: Diagnosis not present

## 2014-12-28 DIAGNOSIS — D509 Iron deficiency anemia, unspecified: Secondary | ICD-10-CM | POA: Diagnosis not present

## 2014-12-28 DIAGNOSIS — Z992 Dependence on renal dialysis: Secondary | ICD-10-CM | POA: Diagnosis not present

## 2015-01-26 DIAGNOSIS — Z992 Dependence on renal dialysis: Secondary | ICD-10-CM | POA: Diagnosis not present

## 2015-01-26 DIAGNOSIS — N186 End stage renal disease: Secondary | ICD-10-CM | POA: Diagnosis not present

## 2015-01-28 DIAGNOSIS — N186 End stage renal disease: Secondary | ICD-10-CM | POA: Diagnosis not present

## 2015-01-28 DIAGNOSIS — D509 Iron deficiency anemia, unspecified: Secondary | ICD-10-CM | POA: Diagnosis not present

## 2015-01-28 DIAGNOSIS — Z992 Dependence on renal dialysis: Secondary | ICD-10-CM | POA: Diagnosis not present

## 2015-01-28 DIAGNOSIS — N2581 Secondary hyperparathyroidism of renal origin: Secondary | ICD-10-CM | POA: Diagnosis not present

## 2015-02-26 DIAGNOSIS — N186 End stage renal disease: Secondary | ICD-10-CM | POA: Diagnosis not present

## 2015-02-26 DIAGNOSIS — Z992 Dependence on renal dialysis: Secondary | ICD-10-CM | POA: Diagnosis not present

## 2015-02-27 DIAGNOSIS — Z992 Dependence on renal dialysis: Secondary | ICD-10-CM | POA: Diagnosis not present

## 2015-02-27 DIAGNOSIS — D509 Iron deficiency anemia, unspecified: Secondary | ICD-10-CM | POA: Diagnosis not present

## 2015-02-27 DIAGNOSIS — N2581 Secondary hyperparathyroidism of renal origin: Secondary | ICD-10-CM | POA: Diagnosis not present

## 2015-02-27 DIAGNOSIS — N186 End stage renal disease: Secondary | ICD-10-CM | POA: Diagnosis not present

## 2015-02-27 DIAGNOSIS — D631 Anemia in chronic kidney disease: Secondary | ICD-10-CM | POA: Diagnosis not present

## 2015-03-01 DIAGNOSIS — N186 End stage renal disease: Secondary | ICD-10-CM | POA: Diagnosis not present

## 2015-03-01 DIAGNOSIS — Z992 Dependence on renal dialysis: Secondary | ICD-10-CM | POA: Diagnosis not present

## 2015-03-01 DIAGNOSIS — D509 Iron deficiency anemia, unspecified: Secondary | ICD-10-CM | POA: Diagnosis not present

## 2015-03-01 DIAGNOSIS — D631 Anemia in chronic kidney disease: Secondary | ICD-10-CM | POA: Diagnosis not present

## 2015-03-01 DIAGNOSIS — N2581 Secondary hyperparathyroidism of renal origin: Secondary | ICD-10-CM | POA: Diagnosis not present

## 2015-03-04 DIAGNOSIS — N186 End stage renal disease: Secondary | ICD-10-CM | POA: Diagnosis not present

## 2015-03-04 DIAGNOSIS — N2581 Secondary hyperparathyroidism of renal origin: Secondary | ICD-10-CM | POA: Diagnosis not present

## 2015-03-04 DIAGNOSIS — D631 Anemia in chronic kidney disease: Secondary | ICD-10-CM | POA: Diagnosis not present

## 2015-03-04 DIAGNOSIS — Z992 Dependence on renal dialysis: Secondary | ICD-10-CM | POA: Diagnosis not present

## 2015-03-04 DIAGNOSIS — D509 Iron deficiency anemia, unspecified: Secondary | ICD-10-CM | POA: Diagnosis not present

## 2015-03-06 DIAGNOSIS — D631 Anemia in chronic kidney disease: Secondary | ICD-10-CM | POA: Diagnosis not present

## 2015-03-06 DIAGNOSIS — D509 Iron deficiency anemia, unspecified: Secondary | ICD-10-CM | POA: Diagnosis not present

## 2015-03-06 DIAGNOSIS — N2581 Secondary hyperparathyroidism of renal origin: Secondary | ICD-10-CM | POA: Diagnosis not present

## 2015-03-06 DIAGNOSIS — Z992 Dependence on renal dialysis: Secondary | ICD-10-CM | POA: Diagnosis not present

## 2015-03-06 DIAGNOSIS — N186 End stage renal disease: Secondary | ICD-10-CM | POA: Diagnosis not present

## 2015-03-08 ENCOUNTER — Other Ambulatory Visit: Payer: Self-pay

## 2015-03-08 ENCOUNTER — Telehealth: Payer: Self-pay

## 2015-03-08 DIAGNOSIS — D631 Anemia in chronic kidney disease: Secondary | ICD-10-CM | POA: Diagnosis not present

## 2015-03-08 DIAGNOSIS — D509 Iron deficiency anemia, unspecified: Secondary | ICD-10-CM | POA: Diagnosis not present

## 2015-03-08 DIAGNOSIS — N2581 Secondary hyperparathyroidism of renal origin: Secondary | ICD-10-CM | POA: Diagnosis not present

## 2015-03-08 DIAGNOSIS — Z992 Dependence on renal dialysis: Secondary | ICD-10-CM | POA: Diagnosis not present

## 2015-03-08 DIAGNOSIS — N186 End stage renal disease: Secondary | ICD-10-CM | POA: Diagnosis not present

## 2015-03-08 NOTE — Telephone Encounter (Signed)
rec'd request to schedule Fistulogram left arm due to "whistling" of AVF.  Fistulogram scheduled for 03/14/15 @ 10:00 AM.  Notified Heather @ the dialysis center of date of procedure.  Stated she will inform pt.

## 2015-03-11 DIAGNOSIS — Z992 Dependence on renal dialysis: Secondary | ICD-10-CM | POA: Diagnosis not present

## 2015-03-11 DIAGNOSIS — N2581 Secondary hyperparathyroidism of renal origin: Secondary | ICD-10-CM | POA: Diagnosis not present

## 2015-03-11 DIAGNOSIS — D631 Anemia in chronic kidney disease: Secondary | ICD-10-CM | POA: Diagnosis not present

## 2015-03-11 DIAGNOSIS — D509 Iron deficiency anemia, unspecified: Secondary | ICD-10-CM | POA: Diagnosis not present

## 2015-03-11 DIAGNOSIS — N186 End stage renal disease: Secondary | ICD-10-CM | POA: Diagnosis not present

## 2015-03-13 DIAGNOSIS — N2581 Secondary hyperparathyroidism of renal origin: Secondary | ICD-10-CM | POA: Diagnosis not present

## 2015-03-13 DIAGNOSIS — N186 End stage renal disease: Secondary | ICD-10-CM | POA: Diagnosis not present

## 2015-03-13 DIAGNOSIS — Z992 Dependence on renal dialysis: Secondary | ICD-10-CM | POA: Diagnosis not present

## 2015-03-13 DIAGNOSIS — D509 Iron deficiency anemia, unspecified: Secondary | ICD-10-CM | POA: Diagnosis not present

## 2015-03-13 DIAGNOSIS — D631 Anemia in chronic kidney disease: Secondary | ICD-10-CM | POA: Diagnosis not present

## 2015-03-14 ENCOUNTER — Ambulatory Visit (HOSPITAL_COMMUNITY)
Admission: RE | Admit: 2015-03-14 | Discharge: 2015-03-14 | Disposition: A | Discharge status: Home / Self Care | Payer: Medicare Other | Source: Ambulatory Visit | Attending: Vascular Surgery | Admitting: Vascular Surgery

## 2015-03-14 ENCOUNTER — Encounter (HOSPITAL_COMMUNITY)
Admission: RE | Disposition: A | Discharge status: Home / Self Care | Payer: Self-pay | Source: Ambulatory Visit | Attending: Vascular Surgery

## 2015-03-14 DIAGNOSIS — I132 Hypertensive heart and chronic kidney disease with heart failure and with stage 5 chronic kidney disease, or end stage renal disease: Secondary | ICD-10-CM | POA: Diagnosis not present

## 2015-03-14 DIAGNOSIS — Z8673 Personal history of transient ischemic attack (TIA), and cerebral infarction without residual deficits: Secondary | ICD-10-CM | POA: Diagnosis not present

## 2015-03-14 DIAGNOSIS — I509 Heart failure, unspecified: Secondary | ICD-10-CM | POA: Diagnosis not present

## 2015-03-14 DIAGNOSIS — J449 Chronic obstructive pulmonary disease, unspecified: Secondary | ICD-10-CM | POA: Insufficient documentation

## 2015-03-14 DIAGNOSIS — I2510 Atherosclerotic heart disease of native coronary artery without angina pectoris: Secondary | ICD-10-CM | POA: Insufficient documentation

## 2015-03-14 DIAGNOSIS — Z992 Dependence on renal dialysis: Secondary | ICD-10-CM | POA: Insufficient documentation

## 2015-03-14 DIAGNOSIS — Z951 Presence of aortocoronary bypass graft: Secondary | ICD-10-CM | POA: Diagnosis not present

## 2015-03-14 DIAGNOSIS — Z8249 Family history of ischemic heart disease and other diseases of the circulatory system: Secondary | ICD-10-CM | POA: Diagnosis not present

## 2015-03-14 DIAGNOSIS — Z4901 Encounter for fitting and adjustment of extracorporeal dialysis catheter: Secondary | ICD-10-CM | POA: Diagnosis not present

## 2015-03-14 DIAGNOSIS — I739 Peripheral vascular disease, unspecified: Secondary | ICD-10-CM | POA: Insufficient documentation

## 2015-03-14 DIAGNOSIS — E782 Mixed hyperlipidemia: Secondary | ICD-10-CM | POA: Diagnosis not present

## 2015-03-14 DIAGNOSIS — T8249XA Other complication of vascular dialysis catheter, initial encounter: Secondary | ICD-10-CM | POA: Diagnosis not present

## 2015-03-14 DIAGNOSIS — N186 End stage renal disease: Secondary | ICD-10-CM | POA: Diagnosis not present

## 2015-03-14 HISTORY — PX: PERIPHERAL VASCULAR CATHETERIZATION: SHX172C

## 2015-03-14 LAB — POCT I-STAT, CHEM 8
BUN: 30 mg/dL — AB (ref 6–20)
CREATININE: 6.6 mg/dL — AB (ref 0.61–1.24)
Calcium, Ion: 1.1 mmol/L — ABNORMAL LOW (ref 1.12–1.23)
Chloride: 101 mmol/L (ref 101–111)
GLUCOSE: 101 mg/dL — AB (ref 65–99)
HEMATOCRIT: 39 % (ref 39.0–52.0)
HEMOGLOBIN: 13.3 g/dL (ref 13.0–17.0)
Potassium: 4.6 mmol/L (ref 3.5–5.1)
Sodium: 140 mmol/L (ref 135–145)
TCO2: 29 mmol/L (ref 0–100)

## 2015-03-14 SURGERY — A/V SHUNTOGRAM/FISTULAGRAM
Anesthesia: LOCAL

## 2015-03-14 MED ORDER — SODIUM CHLORIDE 0.9% FLUSH: 3.0000 mL | Freq: Two times a day (BID) | INTRAVENOUS | Status: DC

## 2015-03-14 MED ORDER — SODIUM CHLORIDE 0.9% FLUSH
3.0000 mL | INTRAVENOUS | Status: DC | PRN
  Administered 2015-03-14: 3 mL via INTRAVENOUS
  Filled 2015-03-14: qty 3

## 2015-03-14 MED ORDER — HYDRALAZINE HCL 20 MG/ML IJ SOLN
INTRAMUSCULAR | Status: AC
Start: 2015-03-14 — End: 2015-03-14
  Filled 2015-03-14: qty 1

## 2015-03-14 MED ORDER — SODIUM CHLORIDE 0.9 % IV SOLN: 250.0000 mL | INTRAVENOUS | Status: DC | PRN

## 2015-03-14 MED ORDER — ACETAMINOPHEN 325 MG PO TABS: 650.0000 mg | ORAL_TABLET | ORAL | Status: DC | PRN

## 2015-03-14 MED ORDER — HEPARIN (PORCINE) IN NACL 2-0.9 UNIT/ML-% IJ SOLN
INTRAMUSCULAR | Status: DC | PRN
  Administered 2015-03-14: 12:00:00

## 2015-03-14 MED ORDER — SODIUM CHLORIDE 0.9% FLUSH: 3.0000 mL | INTRAVENOUS | Status: DC | PRN

## 2015-03-14 MED ORDER — HEPARIN (PORCINE) IN NACL 2-0.9 UNIT/ML-% IJ SOLN
INTRAMUSCULAR | Status: AC
  Filled 2015-03-14: qty 500

## 2015-03-14 MED ORDER — HYDRALAZINE HCL 20 MG/ML IJ SOLN
INTRAMUSCULAR | Status: DC | PRN
  Administered 2015-03-14: 10 mg via INTRAVENOUS

## 2015-03-14 MED ORDER — LIDOCAINE HCL (PF) 1 % IJ SOLN
INTRAMUSCULAR | Status: DC | PRN
  Administered 2015-03-14: 4 mL

## 2015-03-14 MED ORDER — LIDOCAINE HCL (PF) 1 % IJ SOLN
INTRAMUSCULAR | Status: AC
  Filled 2015-03-14: qty 30

## 2015-03-14 SURGICAL SUPPLY — 9 items
COVER DOME SNAP 22 D (MISCELLANEOUS) ×2 IMPLANT
COVER PRB 48X5XTLSCP FOLD TPE (BAG) ×1 IMPLANT
COVER PROBE 5X48 (BAG) ×1
KIT MICROINTRODUCER STIFF 5F (SHEATH) ×2 IMPLANT
PROTECTION STATION PRESSURIZED (MISCELLANEOUS) ×2
STATION PROTECTION PRESSURIZED (MISCELLANEOUS) ×1 IMPLANT
STOPCOCK MORSE 400PSI 3WAY (MISCELLANEOUS) ×2 IMPLANT
TRAY PV CATH (CUSTOM PROCEDURE TRAY) ×2 IMPLANT
TUBING CIL FLEX 10 FLL-RA (TUBING) ×2 IMPLANT

## 2015-03-14 NOTE — Op Note (Signed)
    OPERATIVE NOTE   PROCEDURE: 1. left radiocephalic arteriovenous cannulation under ultrasound guidance 2. left arm fistulogram  PRE-OPERATIVE DIAGNOSIS: Malfunctioning left radiocephalic arteriovenous fistula  POST-OPERATIVE DIAGNOSIS: same as above   SURGEON: Leonides Sake, MD  ANESTHESIA: local  ESTIMATED BLOOD LOSS: 5 cc  FINDING(S): 1. Widely patent fistula with degenerated aneurysmal segments 2. Widely patent central venous structures 3. Drainage of forearm cephalic vein into basilic/brachial venous system 4. Widely patent anastomosis  SPECIMEN(S):  None  CONTRAST: 20 cc  INDICATIONS: Mario Alexander is a 45 y.o. male who  presents with malfunctioning left radiocephalic arteriovenous fistula.  The patient is scheduled for left arm fistulogram.  The patient is aware the risks include but are not limited to: bleeding, infection, thrombosis of the cannulated access, and possible anaphylactic reaction to the contrast.  The patient is aware of the risks of the procedure and elects to proceed forward.  DESCRIPTION: After full informed written consent was obtained, the patient was brought back to the angiography suite and placed supine upon the angiography table.  The patient was connected to monitoring equipment.  The left forearm was prepped and draped in the standard fashion for a left arm fistulogramgram.  Under ultrasound guidance, the left radiocephalic arteriovenous fistula was cannulated with a micropuncture needle.  The microwire was advanced into the fistula and the needle was exchanged for the a microsheath, which was lodged 2 cm into the access.  The wire was removed and the sheath was connected to the IV extension tubing.  Hand injections were completed to image the access from the wrist up to the level of the heart.  The central venous structures were also imaged by hand injections.  Based on the images, this patient will need: no intervention.  I suspect some his problem  with dialysis might be due to recirculation within the large aneurysmal segment.  A 4-0 Monocryl purse-string suture was sewn around the sheath.  The sheath was removed while tying down the suture.  A sterile bandage was applied to the puncture site.   COMPLICATIONS: none  CONDITION: stable   Leonides Sake, MD [facility] [place] Office: [phone] Pager: [phone]  03/14/2015 12:26 PM

## 2015-03-14 NOTE — H&P (Signed)
Brief History and Physical  History of Present Illness  Mario Alexander is a 45 y.o. male who presents with chief complaint: difficulties with flow rates.  The patient presents today for L arm fistulogram, possible intervention.  He denies any steal.  He is not aware any frank thrombosis during his HD runs..    Past Medical History  Diagnosis Date  . Essential hypertension, benign   . Mixed hyperlipidemia   . ESRD on hemodialysis (HCC)   . History of medication noncompliance   . Hypertensive heart disease   . History of stroke 2012    No ASD, PFO, SOE by TEE 01/2010  . Coronary atherosclerosis of native coronary artery     Multivessel status post CABG at [facility] 07/2011, LVEF 70%  . CHF (congestive heart failure) (HCC)   . COPD (chronic obstructive pulmonary disease) (HCC)   . Peripheral vascular disease [facility])     Past Surgical History  Procedure Laterality Date  . Insertion of dialysis catheter  01/02/2011    Procedure: INSERTION OF DIALYSIS CATHETER;  Surgeon: Pryor Ochoa, MD;  Location: [facility] OR;  Service: Vascular;  Laterality: N/A;  Insertion of Dialysis catheter Right Internal Jugular with 24cm dialysis catheter  . Coronary artery bypass graft  07/2011    [facility] - LIMA to LAD, SVG to circumflex, SVG to ramus intermedius  . Shuntogram N/A 02/01/2014    Procedure: FISTULOGRAM;  Surgeon: Fransisco Hertz, MD;  [facility] C[facility] LAB;  Service: Cardiovascular;  Laterality: N/A;  . Cardiac catheterization    . Colonoscopy    . Revison of arteriovenous fistula Left 11/08/2014    Procedure: REVISION PLICATION OF LEFT ARM ARTERIOVENOUS FISTULA;  Surgeon: Chuck Hint, MD;  Location: [facility] OR;  Service: Vascular;  Laterality: Left;  . Insertion of dialysis catheter Right 11/08/2014    Procedure: INSERTION OF DIALYSIS CATHETER RIGHT INTERNAL JUGULAR;  Surgeon: Chuck Hint, MD;  Location: [facility] OR;  Service: Vascular;  Laterality: Right;    Social History   Social History  .  Marital Status: Married    Spouse Name: N/A  . Number of Children: N/A  . Years of Education: N/A   Occupational History  . Not on file.   Social History Main Topics  . Smoking status: Never Smoker   . Smokeless tobacco: Never Used  . Alcohol Use: No  . Drug Use: No  . Sexual Activity: Yes    Birth Control/ Protection: None   Other Topics Concern  . Not on file   Social History Narrative    Family History  Problem Relation Age of Onset  . Diabetes Mother   . Hypertension Mother   . Hyperlipidemia Mother   . Heart disease Mother     before age 56  . COPD Father   . Lung cancer Father   . Cancer Father     No current facility-administered medications on file prior to encounter.   Current Outpatient Prescriptions on File Prior to Encounter  Medication Sig Dispense Refill  . cloNIDine (CATAPRES) 0.2 MG tablet Take 0.2 mg by mouth 2 (two) times daily.    . hydrALAZINE (APRESOLINE) 50 MG tablet Take 50 mg by mouth 2 (two) times daily.    Marland Kitchen labetalol (NORMODYNE) 200 MG tablet Take 200 mg by mouth 2 (two) times daily.     . multivitamin (RENA-VIT) TABS tablet Take 1 tablet by mouth 3 (three) times daily.     Marland Kitchen oxyCODONE-acetaminophen (ROXICET)  5-325 MG tablet Take 1-2 tablets by mouth every 4 (four) hours as needed for severe pain. 30 tablet 0  . sevelamer carbonate (RENVELA) 800 MG tablet Take 800 mg by mouth 3 (three) times daily with meals.      No Known Allergies  Review of Systems: As listed above, otherwise negative.  Physical Examination  Filed Vitals:   03/14/15 0731  BP: 195/126  Pulse: 84  Temp: 98 F (36.7 C)  TempSrc: Oral  Resp: 18  Height:  (1.676 m)  Weight: 190 lb (86.183 kg)  SpO2: 100%    General: A&O x 3, WDWN  Pulmonary: Sym exp, good air movt, CTAB, no rales, rhonchi, & wheezing  Cardiac: RRR, Nl S1, S2, no Murmurs, rubs or gallops  Gastrointestinal: soft, NTND, -G/R, - HSM, - masses, - CVAT B  Musculoskeletal: M/S 5/5  throughout , Extremities without ischemic changes , aneurysmal L RC AVF with pulsatile character  Laboratory See iStat  Medical Decision Making  Mario Alexander is a 45 y.o. male who presents with: poor flow rates in L RC AVF, possible venous stenosis.   The patient is scheduled for: L arm fistulogram, possible intervention. I discussed with the patient the nature of angiographic procedures, especially the limited patencies of any endovascular intervention.  The patient is aware of that the risks of an angiographic procedure include but are not limited to: bleeding, infection, access site complications, renal failure, embolization, rupture of vessel, dissection, possible need for emergent surgical intervention, possible need for surgical procedures to treat the patient's pathology, and stroke and death.    The patient is aware of the risks and agrees to proceed.  Mario Sake, MD [facility] [place] Office: [phone] Pager: [phone]  03/14/2015, 11:22 AM

## 2015-03-14 NOTE — Discharge Instructions (Signed)
Fistulogram, Care After °Refer to this sheet in the next few weeks. These instructions provide you with information on caring for yourself after your procedure. Your health care provider may also give you more specific instructions. Your treatment has been planned according to current medical practices, but problems sometimes occur. Call your health care provider if you have any problems or questions after your procedure. °WHAT TO EXPECT AFTER THE PROCEDURE °After your procedure, it is typical to have the following: °· A small amount of discomfort in the area where the catheters were placed. °· A small amount of bruising around the fistula. °· Sleepiness and fatigue. °HOME CARE INSTRUCTIONS °· Rest at home for the day following your procedure. °· Take medicines only as directed by your health care provider. °· Do not take baths, swim, or use a hot tub until your health care provider approves. You may shower 24 hours after the procedure or as directed by your health care provider. °· There are many different ways to close and cover an incision, including stitches, skin glue, and adhesive strips. Follow your health care provider's instructions on: °¨ Incision care. °¨ Bandage (dressing) changes and removal. °¨ Incision closure removal. °· Monitor your dialysis fistula carefully. °SEEK MEDICAL CARE IF: °· You have drainage, redness, swelling, or pain at your catheter site. °· You have a fever. °· You have chills. °SEEK IMMEDIATE MEDICAL CARE IF: °· You feel weak. °· You have trouble balancing. °· You have trouble moving your arms or legs. °· You have problems with your speech or vision. °· You can no longer feel a vibration or buzz when you put your fingers over your dialysis fistula. °· The limb that was used for the procedure: °¨ Swells. °¨ Is painful. °¨ Is cold. °¨ Is discolored, such as blue or pale white. °  °This information is not intended to replace advice given to you by your health care provider. Make sure  you discuss any questions you have with your health care provider. °  °Document Released: 05/29/2013 Document Reviewed: 05/29/2013 °Elsevier Interactive Patient Education ©2016 [employer] ° °

## 2015-03-15 ENCOUNTER — Encounter (HOSPITAL_COMMUNITY): Payer: Self-pay | Admitting: Vascular Surgery

## 2015-03-15 DIAGNOSIS — D631 Anemia in chronic kidney disease: Secondary | ICD-10-CM | POA: Diagnosis not present

## 2015-03-15 DIAGNOSIS — N2581 Secondary hyperparathyroidism of renal origin: Secondary | ICD-10-CM | POA: Diagnosis not present

## 2015-03-15 DIAGNOSIS — N186 End stage renal disease: Secondary | ICD-10-CM | POA: Diagnosis not present

## 2015-03-15 DIAGNOSIS — D509 Iron deficiency anemia, unspecified: Secondary | ICD-10-CM | POA: Diagnosis not present

## 2015-03-15 DIAGNOSIS — Z992 Dependence on renal dialysis: Secondary | ICD-10-CM | POA: Diagnosis not present

## 2015-03-18 DIAGNOSIS — Z992 Dependence on renal dialysis: Secondary | ICD-10-CM | POA: Diagnosis not present

## 2015-03-18 DIAGNOSIS — N2581 Secondary hyperparathyroidism of renal origin: Secondary | ICD-10-CM | POA: Diagnosis not present

## 2015-03-18 DIAGNOSIS — N186 End stage renal disease: Secondary | ICD-10-CM | POA: Diagnosis not present

## 2015-03-18 DIAGNOSIS — D631 Anemia in chronic kidney disease: Secondary | ICD-10-CM | POA: Diagnosis not present

## 2015-03-18 DIAGNOSIS — D509 Iron deficiency anemia, unspecified: Secondary | ICD-10-CM | POA: Diagnosis not present

## 2015-03-20 DIAGNOSIS — N2581 Secondary hyperparathyroidism of renal origin: Secondary | ICD-10-CM | POA: Diagnosis not present

## 2015-03-20 DIAGNOSIS — D509 Iron deficiency anemia, unspecified: Secondary | ICD-10-CM | POA: Diagnosis not present

## 2015-03-20 DIAGNOSIS — Z992 Dependence on renal dialysis: Secondary | ICD-10-CM | POA: Diagnosis not present

## 2015-03-20 DIAGNOSIS — D631 Anemia in chronic kidney disease: Secondary | ICD-10-CM | POA: Diagnosis not present

## 2015-03-20 DIAGNOSIS — N186 End stage renal disease: Secondary | ICD-10-CM | POA: Diagnosis not present

## 2015-03-22 DIAGNOSIS — D509 Iron deficiency anemia, unspecified: Secondary | ICD-10-CM | POA: Diagnosis not present

## 2015-03-22 DIAGNOSIS — Z992 Dependence on renal dialysis: Secondary | ICD-10-CM | POA: Diagnosis not present

## 2015-03-22 DIAGNOSIS — N2581 Secondary hyperparathyroidism of renal origin: Secondary | ICD-10-CM | POA: Diagnosis not present

## 2015-03-22 DIAGNOSIS — N186 End stage renal disease: Secondary | ICD-10-CM | POA: Diagnosis not present

## 2015-03-22 DIAGNOSIS — D631 Anemia in chronic kidney disease: Secondary | ICD-10-CM | POA: Diagnosis not present

## 2015-03-25 DIAGNOSIS — N186 End stage renal disease: Secondary | ICD-10-CM | POA: Diagnosis not present

## 2015-03-25 DIAGNOSIS — N2581 Secondary hyperparathyroidism of renal origin: Secondary | ICD-10-CM | POA: Diagnosis not present

## 2015-03-25 DIAGNOSIS — Z992 Dependence on renal dialysis: Secondary | ICD-10-CM | POA: Diagnosis not present

## 2015-03-25 DIAGNOSIS — D631 Anemia in chronic kidney disease: Secondary | ICD-10-CM | POA: Diagnosis not present

## 2015-03-25 DIAGNOSIS — D509 Iron deficiency anemia, unspecified: Secondary | ICD-10-CM | POA: Diagnosis not present

## 2015-03-26 DIAGNOSIS — Z992 Dependence on renal dialysis: Secondary | ICD-10-CM | POA: Diagnosis not present

## 2015-03-26 DIAGNOSIS — N186 End stage renal disease: Secondary | ICD-10-CM | POA: Diagnosis not present

## 2015-03-27 DIAGNOSIS — N186 End stage renal disease: Secondary | ICD-10-CM | POA: Diagnosis not present

## 2015-03-27 DIAGNOSIS — D509 Iron deficiency anemia, unspecified: Secondary | ICD-10-CM | POA: Diagnosis not present

## 2015-03-27 DIAGNOSIS — N2581 Secondary hyperparathyroidism of renal origin: Secondary | ICD-10-CM | POA: Diagnosis not present

## 2015-03-27 DIAGNOSIS — Z992 Dependence on renal dialysis: Secondary | ICD-10-CM | POA: Diagnosis not present

## 2015-03-27 DIAGNOSIS — D631 Anemia in chronic kidney disease: Secondary | ICD-10-CM | POA: Diagnosis not present

## 2015-03-29 DIAGNOSIS — N186 End stage renal disease: Secondary | ICD-10-CM | POA: Diagnosis not present

## 2015-03-29 DIAGNOSIS — Z992 Dependence on renal dialysis: Secondary | ICD-10-CM | POA: Diagnosis not present

## 2015-03-29 DIAGNOSIS — D509 Iron deficiency anemia, unspecified: Secondary | ICD-10-CM | POA: Diagnosis not present

## 2015-03-29 DIAGNOSIS — N2581 Secondary hyperparathyroidism of renal origin: Secondary | ICD-10-CM | POA: Diagnosis not present

## 2015-03-29 DIAGNOSIS — D631 Anemia in chronic kidney disease: Secondary | ICD-10-CM | POA: Diagnosis not present

## 2015-04-01 DIAGNOSIS — N2581 Secondary hyperparathyroidism of renal origin: Secondary | ICD-10-CM | POA: Diagnosis not present

## 2015-04-01 DIAGNOSIS — D509 Iron deficiency anemia, unspecified: Secondary | ICD-10-CM | POA: Diagnosis not present

## 2015-04-01 DIAGNOSIS — N186 End stage renal disease: Secondary | ICD-10-CM | POA: Diagnosis not present

## 2015-04-01 DIAGNOSIS — Z992 Dependence on renal dialysis: Secondary | ICD-10-CM | POA: Diagnosis not present

## 2015-04-01 DIAGNOSIS — D631 Anemia in chronic kidney disease: Secondary | ICD-10-CM | POA: Diagnosis not present

## 2015-04-03 DIAGNOSIS — N2581 Secondary hyperparathyroidism of renal origin: Secondary | ICD-10-CM | POA: Diagnosis not present

## 2015-04-03 DIAGNOSIS — D631 Anemia in chronic kidney disease: Secondary | ICD-10-CM | POA: Diagnosis not present

## 2015-04-03 DIAGNOSIS — N186 End stage renal disease: Secondary | ICD-10-CM | POA: Diagnosis not present

## 2015-04-03 DIAGNOSIS — Z992 Dependence on renal dialysis: Secondary | ICD-10-CM | POA: Diagnosis not present

## 2015-04-03 DIAGNOSIS — D509 Iron deficiency anemia, unspecified: Secondary | ICD-10-CM | POA: Diagnosis not present

## 2015-04-05 DIAGNOSIS — Z992 Dependence on renal dialysis: Secondary | ICD-10-CM | POA: Diagnosis not present

## 2015-04-05 DIAGNOSIS — N186 End stage renal disease: Secondary | ICD-10-CM | POA: Diagnosis not present

## 2015-04-05 DIAGNOSIS — N2581 Secondary hyperparathyroidism of renal origin: Secondary | ICD-10-CM | POA: Diagnosis not present

## 2015-04-05 DIAGNOSIS — D509 Iron deficiency anemia, unspecified: Secondary | ICD-10-CM | POA: Diagnosis not present

## 2015-04-05 DIAGNOSIS — D631 Anemia in chronic kidney disease: Secondary | ICD-10-CM | POA: Diagnosis not present

## 2015-04-08 DIAGNOSIS — N2581 Secondary hyperparathyroidism of renal origin: Secondary | ICD-10-CM | POA: Diagnosis not present

## 2015-04-08 DIAGNOSIS — D509 Iron deficiency anemia, unspecified: Secondary | ICD-10-CM | POA: Diagnosis not present

## 2015-04-08 DIAGNOSIS — N186 End stage renal disease: Secondary | ICD-10-CM | POA: Diagnosis not present

## 2015-04-08 DIAGNOSIS — D631 Anemia in chronic kidney disease: Secondary | ICD-10-CM | POA: Diagnosis not present

## 2015-04-08 DIAGNOSIS — Z992 Dependence on renal dialysis: Secondary | ICD-10-CM | POA: Diagnosis not present

## 2015-04-10 DIAGNOSIS — N186 End stage renal disease: Secondary | ICD-10-CM | POA: Diagnosis not present

## 2015-04-10 DIAGNOSIS — D509 Iron deficiency anemia, unspecified: Secondary | ICD-10-CM | POA: Diagnosis not present

## 2015-04-10 DIAGNOSIS — N2581 Secondary hyperparathyroidism of renal origin: Secondary | ICD-10-CM | POA: Diagnosis not present

## 2015-04-10 DIAGNOSIS — D631 Anemia in chronic kidney disease: Secondary | ICD-10-CM | POA: Diagnosis not present

## 2015-04-10 DIAGNOSIS — Z992 Dependence on renal dialysis: Secondary | ICD-10-CM | POA: Diagnosis not present

## 2015-04-12 DIAGNOSIS — D509 Iron deficiency anemia, unspecified: Secondary | ICD-10-CM | POA: Diagnosis not present

## 2015-04-12 DIAGNOSIS — Z992 Dependence on renal dialysis: Secondary | ICD-10-CM | POA: Diagnosis not present

## 2015-04-12 DIAGNOSIS — N2581 Secondary hyperparathyroidism of renal origin: Secondary | ICD-10-CM | POA: Diagnosis not present

## 2015-04-12 DIAGNOSIS — N186 End stage renal disease: Secondary | ICD-10-CM | POA: Diagnosis not present

## 2015-04-12 DIAGNOSIS — D631 Anemia in chronic kidney disease: Secondary | ICD-10-CM | POA: Diagnosis not present

## 2015-04-15 DIAGNOSIS — N2581 Secondary hyperparathyroidism of renal origin: Secondary | ICD-10-CM | POA: Diagnosis not present

## 2015-04-15 DIAGNOSIS — D631 Anemia in chronic kidney disease: Secondary | ICD-10-CM | POA: Diagnosis not present

## 2015-04-15 DIAGNOSIS — D509 Iron deficiency anemia, unspecified: Secondary | ICD-10-CM | POA: Diagnosis not present

## 2015-04-15 DIAGNOSIS — N186 End stage renal disease: Secondary | ICD-10-CM | POA: Diagnosis not present

## 2015-04-15 DIAGNOSIS — Z992 Dependence on renal dialysis: Secondary | ICD-10-CM | POA: Diagnosis not present

## 2015-04-17 DIAGNOSIS — D509 Iron deficiency anemia, unspecified: Secondary | ICD-10-CM | POA: Diagnosis not present

## 2015-04-17 DIAGNOSIS — D631 Anemia in chronic kidney disease: Secondary | ICD-10-CM | POA: Diagnosis not present

## 2015-04-17 DIAGNOSIS — N186 End stage renal disease: Secondary | ICD-10-CM | POA: Diagnosis not present

## 2015-04-17 DIAGNOSIS — N2581 Secondary hyperparathyroidism of renal origin: Secondary | ICD-10-CM | POA: Diagnosis not present

## 2015-04-17 DIAGNOSIS — Z992 Dependence on renal dialysis: Secondary | ICD-10-CM | POA: Diagnosis not present

## 2015-04-19 DIAGNOSIS — D509 Iron deficiency anemia, unspecified: Secondary | ICD-10-CM | POA: Diagnosis not present

## 2015-04-19 DIAGNOSIS — N2581 Secondary hyperparathyroidism of renal origin: Secondary | ICD-10-CM | POA: Diagnosis not present

## 2015-04-19 DIAGNOSIS — N186 End stage renal disease: Secondary | ICD-10-CM | POA: Diagnosis not present

## 2015-04-19 DIAGNOSIS — D631 Anemia in chronic kidney disease: Secondary | ICD-10-CM | POA: Diagnosis not present

## 2015-04-19 DIAGNOSIS — Z992 Dependence on renal dialysis: Secondary | ICD-10-CM | POA: Diagnosis not present

## 2015-04-22 DIAGNOSIS — N186 End stage renal disease: Secondary | ICD-10-CM | POA: Diagnosis not present

## 2015-04-22 DIAGNOSIS — D509 Iron deficiency anemia, unspecified: Secondary | ICD-10-CM | POA: Diagnosis not present

## 2015-04-22 DIAGNOSIS — D631 Anemia in chronic kidney disease: Secondary | ICD-10-CM | POA: Diagnosis not present

## 2015-04-22 DIAGNOSIS — N2581 Secondary hyperparathyroidism of renal origin: Secondary | ICD-10-CM | POA: Diagnosis not present

## 2015-04-22 DIAGNOSIS — Z992 Dependence on renal dialysis: Secondary | ICD-10-CM | POA: Diagnosis not present

## 2015-04-24 DIAGNOSIS — D509 Iron deficiency anemia, unspecified: Secondary | ICD-10-CM | POA: Diagnosis not present

## 2015-04-24 DIAGNOSIS — Z992 Dependence on renal dialysis: Secondary | ICD-10-CM | POA: Diagnosis not present

## 2015-04-24 DIAGNOSIS — D631 Anemia in chronic kidney disease: Secondary | ICD-10-CM | POA: Diagnosis not present

## 2015-04-24 DIAGNOSIS — N186 End stage renal disease: Secondary | ICD-10-CM | POA: Diagnosis not present

## 2015-04-24 DIAGNOSIS — N2581 Secondary hyperparathyroidism of renal origin: Secondary | ICD-10-CM | POA: Diagnosis not present

## 2015-04-26 DIAGNOSIS — N186 End stage renal disease: Secondary | ICD-10-CM | POA: Diagnosis not present

## 2015-04-26 DIAGNOSIS — D509 Iron deficiency anemia, unspecified: Secondary | ICD-10-CM | POA: Diagnosis not present

## 2015-04-26 DIAGNOSIS — N2581 Secondary hyperparathyroidism of renal origin: Secondary | ICD-10-CM | POA: Diagnosis not present

## 2015-04-26 DIAGNOSIS — Z992 Dependence on renal dialysis: Secondary | ICD-10-CM | POA: Diagnosis not present

## 2015-04-26 DIAGNOSIS — D631 Anemia in chronic kidney disease: Secondary | ICD-10-CM | POA: Diagnosis not present

## 2015-04-29 DIAGNOSIS — Z992 Dependence on renal dialysis: Secondary | ICD-10-CM | POA: Diagnosis not present

## 2015-04-29 DIAGNOSIS — N2581 Secondary hyperparathyroidism of renal origin: Secondary | ICD-10-CM | POA: Diagnosis not present

## 2015-04-29 DIAGNOSIS — D509 Iron deficiency anemia, unspecified: Secondary | ICD-10-CM | POA: Diagnosis not present

## 2015-04-29 DIAGNOSIS — N186 End stage renal disease: Secondary | ICD-10-CM | POA: Diagnosis not present

## 2015-04-29 DIAGNOSIS — D631 Anemia in chronic kidney disease: Secondary | ICD-10-CM | POA: Diagnosis not present

## 2015-05-01 DIAGNOSIS — N2581 Secondary hyperparathyroidism of renal origin: Secondary | ICD-10-CM | POA: Diagnosis not present

## 2015-05-01 DIAGNOSIS — D631 Anemia in chronic kidney disease: Secondary | ICD-10-CM | POA: Diagnosis not present

## 2015-05-01 DIAGNOSIS — Z992 Dependence on renal dialysis: Secondary | ICD-10-CM | POA: Diagnosis not present

## 2015-05-01 DIAGNOSIS — D509 Iron deficiency anemia, unspecified: Secondary | ICD-10-CM | POA: Diagnosis not present

## 2015-05-01 DIAGNOSIS — N186 End stage renal disease: Secondary | ICD-10-CM | POA: Diagnosis not present

## 2015-05-03 DIAGNOSIS — D631 Anemia in chronic kidney disease: Secondary | ICD-10-CM | POA: Diagnosis not present

## 2015-05-03 DIAGNOSIS — N2581 Secondary hyperparathyroidism of renal origin: Secondary | ICD-10-CM | POA: Diagnosis not present

## 2015-05-03 DIAGNOSIS — Z992 Dependence on renal dialysis: Secondary | ICD-10-CM | POA: Diagnosis not present

## 2015-05-03 DIAGNOSIS — N186 End stage renal disease: Secondary | ICD-10-CM | POA: Diagnosis not present

## 2015-05-03 DIAGNOSIS — D509 Iron deficiency anemia, unspecified: Secondary | ICD-10-CM | POA: Diagnosis not present

## 2015-05-06 DIAGNOSIS — Z992 Dependence on renal dialysis: Secondary | ICD-10-CM | POA: Diagnosis not present

## 2015-05-06 DIAGNOSIS — N186 End stage renal disease: Secondary | ICD-10-CM | POA: Diagnosis not present

## 2015-05-06 DIAGNOSIS — D509 Iron deficiency anemia, unspecified: Secondary | ICD-10-CM | POA: Diagnosis not present

## 2015-05-06 DIAGNOSIS — N2581 Secondary hyperparathyroidism of renal origin: Secondary | ICD-10-CM | POA: Diagnosis not present

## 2015-05-06 DIAGNOSIS — D631 Anemia in chronic kidney disease: Secondary | ICD-10-CM | POA: Diagnosis not present

## 2015-05-08 DIAGNOSIS — D631 Anemia in chronic kidney disease: Secondary | ICD-10-CM | POA: Diagnosis not present

## 2015-05-08 DIAGNOSIS — N186 End stage renal disease: Secondary | ICD-10-CM | POA: Diagnosis not present

## 2015-05-08 DIAGNOSIS — N2581 Secondary hyperparathyroidism of renal origin: Secondary | ICD-10-CM | POA: Diagnosis not present

## 2015-05-08 DIAGNOSIS — D509 Iron deficiency anemia, unspecified: Secondary | ICD-10-CM | POA: Diagnosis not present

## 2015-05-08 DIAGNOSIS — Z992 Dependence on renal dialysis: Secondary | ICD-10-CM | POA: Diagnosis not present

## 2015-05-10 DIAGNOSIS — D631 Anemia in chronic kidney disease: Secondary | ICD-10-CM | POA: Diagnosis not present

## 2015-05-10 DIAGNOSIS — N2581 Secondary hyperparathyroidism of renal origin: Secondary | ICD-10-CM | POA: Diagnosis not present

## 2015-05-10 DIAGNOSIS — N186 End stage renal disease: Secondary | ICD-10-CM | POA: Diagnosis not present

## 2015-05-10 DIAGNOSIS — Z992 Dependence on renal dialysis: Secondary | ICD-10-CM | POA: Diagnosis not present

## 2015-05-10 DIAGNOSIS — D509 Iron deficiency anemia, unspecified: Secondary | ICD-10-CM | POA: Diagnosis not present

## 2015-05-13 DIAGNOSIS — D509 Iron deficiency anemia, unspecified: Secondary | ICD-10-CM | POA: Diagnosis not present

## 2015-05-13 DIAGNOSIS — D631 Anemia in chronic kidney disease: Secondary | ICD-10-CM | POA: Diagnosis not present

## 2015-05-13 DIAGNOSIS — N2581 Secondary hyperparathyroidism of renal origin: Secondary | ICD-10-CM | POA: Diagnosis not present

## 2015-05-13 DIAGNOSIS — N186 End stage renal disease: Secondary | ICD-10-CM | POA: Diagnosis not present

## 2015-05-13 DIAGNOSIS — Z992 Dependence on renal dialysis: Secondary | ICD-10-CM | POA: Diagnosis not present

## 2015-05-15 DIAGNOSIS — N2581 Secondary hyperparathyroidism of renal origin: Secondary | ICD-10-CM | POA: Diagnosis not present

## 2015-05-15 DIAGNOSIS — D509 Iron deficiency anemia, unspecified: Secondary | ICD-10-CM | POA: Diagnosis not present

## 2015-05-15 DIAGNOSIS — D631 Anemia in chronic kidney disease: Secondary | ICD-10-CM | POA: Diagnosis not present

## 2015-05-15 DIAGNOSIS — Z992 Dependence on renal dialysis: Secondary | ICD-10-CM | POA: Diagnosis not present

## 2015-05-15 DIAGNOSIS — N186 End stage renal disease: Secondary | ICD-10-CM | POA: Diagnosis not present

## 2015-05-17 DIAGNOSIS — Z992 Dependence on renal dialysis: Secondary | ICD-10-CM | POA: Diagnosis not present

## 2015-05-17 DIAGNOSIS — N2581 Secondary hyperparathyroidism of renal origin: Secondary | ICD-10-CM | POA: Diagnosis not present

## 2015-05-17 DIAGNOSIS — N186 End stage renal disease: Secondary | ICD-10-CM | POA: Diagnosis not present

## 2015-05-17 DIAGNOSIS — D509 Iron deficiency anemia, unspecified: Secondary | ICD-10-CM | POA: Diagnosis not present

## 2015-05-17 DIAGNOSIS — D631 Anemia in chronic kidney disease: Secondary | ICD-10-CM | POA: Diagnosis not present

## 2015-05-20 DIAGNOSIS — Z992 Dependence on renal dialysis: Secondary | ICD-10-CM | POA: Diagnosis not present

## 2015-05-20 DIAGNOSIS — D509 Iron deficiency anemia, unspecified: Secondary | ICD-10-CM | POA: Diagnosis not present

## 2015-05-20 DIAGNOSIS — N2581 Secondary hyperparathyroidism of renal origin: Secondary | ICD-10-CM | POA: Diagnosis not present

## 2015-05-20 DIAGNOSIS — D631 Anemia in chronic kidney disease: Secondary | ICD-10-CM | POA: Diagnosis not present

## 2015-05-20 DIAGNOSIS — N186 End stage renal disease: Secondary | ICD-10-CM | POA: Diagnosis not present

## 2015-05-22 DIAGNOSIS — Z992 Dependence on renal dialysis: Secondary | ICD-10-CM | POA: Diagnosis not present

## 2015-05-22 DIAGNOSIS — N2581 Secondary hyperparathyroidism of renal origin: Secondary | ICD-10-CM | POA: Diagnosis not present

## 2015-05-22 DIAGNOSIS — D509 Iron deficiency anemia, unspecified: Secondary | ICD-10-CM | POA: Diagnosis not present

## 2015-05-22 DIAGNOSIS — D631 Anemia in chronic kidney disease: Secondary | ICD-10-CM | POA: Diagnosis not present

## 2015-05-22 DIAGNOSIS — N186 End stage renal disease: Secondary | ICD-10-CM | POA: Diagnosis not present

## 2015-05-24 DIAGNOSIS — D509 Iron deficiency anemia, unspecified: Secondary | ICD-10-CM | POA: Diagnosis not present

## 2015-05-24 DIAGNOSIS — D631 Anemia in chronic kidney disease: Secondary | ICD-10-CM | POA: Diagnosis not present

## 2015-05-24 DIAGNOSIS — N2581 Secondary hyperparathyroidism of renal origin: Secondary | ICD-10-CM | POA: Diagnosis not present

## 2015-05-24 DIAGNOSIS — N186 End stage renal disease: Secondary | ICD-10-CM | POA: Diagnosis not present

## 2015-05-24 DIAGNOSIS — Z992 Dependence on renal dialysis: Secondary | ICD-10-CM | POA: Diagnosis not present

## 2015-05-26 DIAGNOSIS — Z992 Dependence on renal dialysis: Secondary | ICD-10-CM | POA: Diagnosis not present

## 2015-05-26 DIAGNOSIS — N186 End stage renal disease: Secondary | ICD-10-CM | POA: Diagnosis not present

## 2015-05-27 DIAGNOSIS — D509 Iron deficiency anemia, unspecified: Secondary | ICD-10-CM | POA: Diagnosis not present

## 2015-05-27 DIAGNOSIS — N2581 Secondary hyperparathyroidism of renal origin: Secondary | ICD-10-CM | POA: Diagnosis not present

## 2015-05-27 DIAGNOSIS — Z992 Dependence on renal dialysis: Secondary | ICD-10-CM | POA: Diagnosis not present

## 2015-05-27 DIAGNOSIS — N186 End stage renal disease: Secondary | ICD-10-CM | POA: Diagnosis not present

## 2015-05-29 DIAGNOSIS — Z992 Dependence on renal dialysis: Secondary | ICD-10-CM | POA: Diagnosis not present

## 2015-05-29 DIAGNOSIS — N2581 Secondary hyperparathyroidism of renal origin: Secondary | ICD-10-CM | POA: Diagnosis not present

## 2015-05-29 DIAGNOSIS — N186 End stage renal disease: Secondary | ICD-10-CM | POA: Diagnosis not present

## 2015-05-29 DIAGNOSIS — D509 Iron deficiency anemia, unspecified: Secondary | ICD-10-CM | POA: Diagnosis not present

## 2015-05-31 DIAGNOSIS — N2581 Secondary hyperparathyroidism of renal origin: Secondary | ICD-10-CM | POA: Diagnosis not present

## 2015-05-31 DIAGNOSIS — N186 End stage renal disease: Secondary | ICD-10-CM | POA: Diagnosis not present

## 2015-05-31 DIAGNOSIS — Z992 Dependence on renal dialysis: Secondary | ICD-10-CM | POA: Diagnosis not present

## 2015-05-31 DIAGNOSIS — D509 Iron deficiency anemia, unspecified: Secondary | ICD-10-CM | POA: Diagnosis not present

## 2015-06-03 DIAGNOSIS — D509 Iron deficiency anemia, unspecified: Secondary | ICD-10-CM | POA: Diagnosis not present

## 2015-06-03 DIAGNOSIS — Z992 Dependence on renal dialysis: Secondary | ICD-10-CM | POA: Diagnosis not present

## 2015-06-03 DIAGNOSIS — N2581 Secondary hyperparathyroidism of renal origin: Secondary | ICD-10-CM | POA: Diagnosis not present

## 2015-06-03 DIAGNOSIS — N186 End stage renal disease: Secondary | ICD-10-CM | POA: Diagnosis not present

## 2015-06-05 DIAGNOSIS — Z992 Dependence on renal dialysis: Secondary | ICD-10-CM | POA: Diagnosis not present

## 2015-06-05 DIAGNOSIS — N2581 Secondary hyperparathyroidism of renal origin: Secondary | ICD-10-CM | POA: Diagnosis not present

## 2015-06-05 DIAGNOSIS — D509 Iron deficiency anemia, unspecified: Secondary | ICD-10-CM | POA: Diagnosis not present

## 2015-06-05 DIAGNOSIS — N186 End stage renal disease: Secondary | ICD-10-CM | POA: Diagnosis not present

## 2015-06-07 DIAGNOSIS — Z992 Dependence on renal dialysis: Secondary | ICD-10-CM | POA: Diagnosis not present

## 2015-06-07 DIAGNOSIS — D509 Iron deficiency anemia, unspecified: Secondary | ICD-10-CM | POA: Diagnosis not present

## 2015-06-07 DIAGNOSIS — N186 End stage renal disease: Secondary | ICD-10-CM | POA: Diagnosis not present

## 2015-06-07 DIAGNOSIS — N2581 Secondary hyperparathyroidism of renal origin: Secondary | ICD-10-CM | POA: Diagnosis not present

## 2015-06-10 DIAGNOSIS — N186 End stage renal disease: Secondary | ICD-10-CM | POA: Diagnosis not present

## 2015-06-10 DIAGNOSIS — D509 Iron deficiency anemia, unspecified: Secondary | ICD-10-CM | POA: Diagnosis not present

## 2015-06-10 DIAGNOSIS — Z992 Dependence on renal dialysis: Secondary | ICD-10-CM | POA: Diagnosis not present

## 2015-06-10 DIAGNOSIS — N2581 Secondary hyperparathyroidism of renal origin: Secondary | ICD-10-CM | POA: Diagnosis not present

## 2015-06-12 DIAGNOSIS — N2581 Secondary hyperparathyroidism of renal origin: Secondary | ICD-10-CM | POA: Diagnosis not present

## 2015-06-12 DIAGNOSIS — Z992 Dependence on renal dialysis: Secondary | ICD-10-CM | POA: Diagnosis not present

## 2015-06-12 DIAGNOSIS — N186 End stage renal disease: Secondary | ICD-10-CM | POA: Diagnosis not present

## 2015-06-12 DIAGNOSIS — D509 Iron deficiency anemia, unspecified: Secondary | ICD-10-CM | POA: Diagnosis not present

## 2015-06-14 DIAGNOSIS — N2581 Secondary hyperparathyroidism of renal origin: Secondary | ICD-10-CM | POA: Diagnosis not present

## 2015-06-14 DIAGNOSIS — N186 End stage renal disease: Secondary | ICD-10-CM | POA: Diagnosis not present

## 2015-06-14 DIAGNOSIS — D509 Iron deficiency anemia, unspecified: Secondary | ICD-10-CM | POA: Diagnosis not present

## 2015-06-14 DIAGNOSIS — Z992 Dependence on renal dialysis: Secondary | ICD-10-CM | POA: Diagnosis not present

## 2015-06-17 DIAGNOSIS — D509 Iron deficiency anemia, unspecified: Secondary | ICD-10-CM | POA: Diagnosis not present

## 2015-06-17 DIAGNOSIS — N2581 Secondary hyperparathyroidism of renal origin: Secondary | ICD-10-CM | POA: Diagnosis not present

## 2015-06-17 DIAGNOSIS — N186 End stage renal disease: Secondary | ICD-10-CM | POA: Diagnosis not present

## 2015-06-17 DIAGNOSIS — Z992 Dependence on renal dialysis: Secondary | ICD-10-CM | POA: Diagnosis not present

## 2015-06-19 DIAGNOSIS — N186 End stage renal disease: Secondary | ICD-10-CM | POA: Diagnosis not present

## 2015-06-19 DIAGNOSIS — Z992 Dependence on renal dialysis: Secondary | ICD-10-CM | POA: Diagnosis not present

## 2015-06-19 DIAGNOSIS — D509 Iron deficiency anemia, unspecified: Secondary | ICD-10-CM | POA: Diagnosis not present

## 2015-06-19 DIAGNOSIS — N2581 Secondary hyperparathyroidism of renal origin: Secondary | ICD-10-CM | POA: Diagnosis not present

## 2015-06-21 DIAGNOSIS — N2581 Secondary hyperparathyroidism of renal origin: Secondary | ICD-10-CM | POA: Diagnosis not present

## 2015-06-21 DIAGNOSIS — N186 End stage renal disease: Secondary | ICD-10-CM | POA: Diagnosis not present

## 2015-06-21 DIAGNOSIS — Z992 Dependence on renal dialysis: Secondary | ICD-10-CM | POA: Diagnosis not present

## 2015-06-21 DIAGNOSIS — D509 Iron deficiency anemia, unspecified: Secondary | ICD-10-CM | POA: Diagnosis not present

## 2015-06-24 DIAGNOSIS — D509 Iron deficiency anemia, unspecified: Secondary | ICD-10-CM | POA: Diagnosis not present

## 2015-06-24 DIAGNOSIS — N186 End stage renal disease: Secondary | ICD-10-CM | POA: Diagnosis not present

## 2015-06-24 DIAGNOSIS — N2581 Secondary hyperparathyroidism of renal origin: Secondary | ICD-10-CM | POA: Diagnosis not present

## 2015-06-24 DIAGNOSIS — Z992 Dependence on renal dialysis: Secondary | ICD-10-CM | POA: Diagnosis not present

## 2015-06-26 DIAGNOSIS — Z992 Dependence on renal dialysis: Secondary | ICD-10-CM | POA: Diagnosis not present

## 2015-06-26 DIAGNOSIS — D509 Iron deficiency anemia, unspecified: Secondary | ICD-10-CM | POA: Diagnosis not present

## 2015-06-26 DIAGNOSIS — N186 End stage renal disease: Secondary | ICD-10-CM | POA: Diagnosis not present

## 2015-06-26 DIAGNOSIS — N2581 Secondary hyperparathyroidism of renal origin: Secondary | ICD-10-CM | POA: Diagnosis not present

## 2015-06-28 DIAGNOSIS — Z992 Dependence on renal dialysis: Secondary | ICD-10-CM | POA: Diagnosis not present

## 2015-06-28 DIAGNOSIS — N186 End stage renal disease: Secondary | ICD-10-CM | POA: Diagnosis not present

## 2015-06-28 DIAGNOSIS — N2581 Secondary hyperparathyroidism of renal origin: Secondary | ICD-10-CM | POA: Diagnosis not present

## 2015-06-28 DIAGNOSIS — D509 Iron deficiency anemia, unspecified: Secondary | ICD-10-CM | POA: Diagnosis not present

## 2015-07-26 DIAGNOSIS — Z992 Dependence on renal dialysis: Secondary | ICD-10-CM | POA: Diagnosis not present

## 2015-07-26 DIAGNOSIS — N186 End stage renal disease: Secondary | ICD-10-CM | POA: Diagnosis not present

## 2015-07-29 DIAGNOSIS — Z992 Dependence on renal dialysis: Secondary | ICD-10-CM | POA: Diagnosis not present

## 2015-07-29 DIAGNOSIS — D509 Iron deficiency anemia, unspecified: Secondary | ICD-10-CM | POA: Diagnosis not present

## 2015-07-29 DIAGNOSIS — N186 End stage renal disease: Secondary | ICD-10-CM | POA: Diagnosis not present

## 2015-07-29 DIAGNOSIS — N2581 Secondary hyperparathyroidism of renal origin: Secondary | ICD-10-CM | POA: Diagnosis not present

## 2015-07-31 DIAGNOSIS — N186 End stage renal disease: Secondary | ICD-10-CM | POA: Diagnosis not present

## 2015-07-31 DIAGNOSIS — D509 Iron deficiency anemia, unspecified: Secondary | ICD-10-CM | POA: Diagnosis not present

## 2015-07-31 DIAGNOSIS — Z992 Dependence on renal dialysis: Secondary | ICD-10-CM | POA: Diagnosis not present

## 2015-07-31 DIAGNOSIS — N2581 Secondary hyperparathyroidism of renal origin: Secondary | ICD-10-CM | POA: Diagnosis not present

## 2015-08-02 DIAGNOSIS — Z992 Dependence on renal dialysis: Secondary | ICD-10-CM | POA: Diagnosis not present

## 2015-08-02 DIAGNOSIS — N2581 Secondary hyperparathyroidism of renal origin: Secondary | ICD-10-CM | POA: Diagnosis not present

## 2015-08-02 DIAGNOSIS — N186 End stage renal disease: Secondary | ICD-10-CM | POA: Diagnosis not present

## 2015-08-02 DIAGNOSIS — D509 Iron deficiency anemia, unspecified: Secondary | ICD-10-CM | POA: Diagnosis not present

## 2015-08-05 DIAGNOSIS — N186 End stage renal disease: Secondary | ICD-10-CM | POA: Diagnosis not present

## 2015-08-05 DIAGNOSIS — N2581 Secondary hyperparathyroidism of renal origin: Secondary | ICD-10-CM | POA: Diagnosis not present

## 2015-08-05 DIAGNOSIS — Z992 Dependence on renal dialysis: Secondary | ICD-10-CM | POA: Diagnosis not present

## 2015-08-05 DIAGNOSIS — D509 Iron deficiency anemia, unspecified: Secondary | ICD-10-CM | POA: Diagnosis not present

## 2015-08-07 DIAGNOSIS — Z992 Dependence on renal dialysis: Secondary | ICD-10-CM | POA: Diagnosis not present

## 2015-08-07 DIAGNOSIS — D509 Iron deficiency anemia, unspecified: Secondary | ICD-10-CM | POA: Diagnosis not present

## 2015-08-07 DIAGNOSIS — N2581 Secondary hyperparathyroidism of renal origin: Secondary | ICD-10-CM | POA: Diagnosis not present

## 2015-08-07 DIAGNOSIS — N186 End stage renal disease: Secondary | ICD-10-CM | POA: Diagnosis not present

## 2015-08-09 DIAGNOSIS — Z992 Dependence on renal dialysis: Secondary | ICD-10-CM | POA: Diagnosis not present

## 2015-08-09 DIAGNOSIS — N2581 Secondary hyperparathyroidism of renal origin: Secondary | ICD-10-CM | POA: Diagnosis not present

## 2015-08-09 DIAGNOSIS — N186 End stage renal disease: Secondary | ICD-10-CM | POA: Diagnosis not present

## 2015-08-09 DIAGNOSIS — D509 Iron deficiency anemia, unspecified: Secondary | ICD-10-CM | POA: Diagnosis not present

## 2015-08-12 DIAGNOSIS — N2581 Secondary hyperparathyroidism of renal origin: Secondary | ICD-10-CM | POA: Diagnosis not present

## 2015-08-12 DIAGNOSIS — N186 End stage renal disease: Secondary | ICD-10-CM | POA: Diagnosis not present

## 2015-08-12 DIAGNOSIS — Z992 Dependence on renal dialysis: Secondary | ICD-10-CM | POA: Diagnosis not present

## 2015-08-12 DIAGNOSIS — D509 Iron deficiency anemia, unspecified: Secondary | ICD-10-CM | POA: Diagnosis not present

## 2015-08-14 DIAGNOSIS — N186 End stage renal disease: Secondary | ICD-10-CM | POA: Diagnosis not present

## 2015-08-14 DIAGNOSIS — D509 Iron deficiency anemia, unspecified: Secondary | ICD-10-CM | POA: Diagnosis not present

## 2015-08-14 DIAGNOSIS — Z992 Dependence on renal dialysis: Secondary | ICD-10-CM | POA: Diagnosis not present

## 2015-08-14 DIAGNOSIS — N2581 Secondary hyperparathyroidism of renal origin: Secondary | ICD-10-CM | POA: Diagnosis not present

## 2015-08-16 DIAGNOSIS — N186 End stage renal disease: Secondary | ICD-10-CM | POA: Diagnosis not present

## 2015-08-16 DIAGNOSIS — D509 Iron deficiency anemia, unspecified: Secondary | ICD-10-CM | POA: Diagnosis not present

## 2015-08-16 DIAGNOSIS — N2581 Secondary hyperparathyroidism of renal origin: Secondary | ICD-10-CM | POA: Diagnosis not present

## 2015-08-16 DIAGNOSIS — Z992 Dependence on renal dialysis: Secondary | ICD-10-CM | POA: Diagnosis not present

## 2015-08-19 DIAGNOSIS — N186 End stage renal disease: Secondary | ICD-10-CM | POA: Diagnosis not present

## 2015-08-19 DIAGNOSIS — N2581 Secondary hyperparathyroidism of renal origin: Secondary | ICD-10-CM | POA: Diagnosis not present

## 2015-08-19 DIAGNOSIS — Z992 Dependence on renal dialysis: Secondary | ICD-10-CM | POA: Diagnosis not present

## 2015-08-19 DIAGNOSIS — D509 Iron deficiency anemia, unspecified: Secondary | ICD-10-CM | POA: Diagnosis not present

## 2015-08-21 DIAGNOSIS — Z992 Dependence on renal dialysis: Secondary | ICD-10-CM | POA: Diagnosis not present

## 2015-08-21 DIAGNOSIS — N186 End stage renal disease: Secondary | ICD-10-CM | POA: Diagnosis not present

## 2015-08-21 DIAGNOSIS — D509 Iron deficiency anemia, unspecified: Secondary | ICD-10-CM | POA: Diagnosis not present

## 2015-08-21 DIAGNOSIS — N2581 Secondary hyperparathyroidism of renal origin: Secondary | ICD-10-CM | POA: Diagnosis not present

## 2015-08-23 DIAGNOSIS — N186 End stage renal disease: Secondary | ICD-10-CM | POA: Diagnosis not present

## 2015-08-23 DIAGNOSIS — Z992 Dependence on renal dialysis: Secondary | ICD-10-CM | POA: Diagnosis not present

## 2015-08-23 DIAGNOSIS — N2581 Secondary hyperparathyroidism of renal origin: Secondary | ICD-10-CM | POA: Diagnosis not present

## 2015-08-23 DIAGNOSIS — D509 Iron deficiency anemia, unspecified: Secondary | ICD-10-CM | POA: Diagnosis not present

## 2015-08-26 DIAGNOSIS — N186 End stage renal disease: Secondary | ICD-10-CM | POA: Diagnosis not present

## 2015-08-26 DIAGNOSIS — D509 Iron deficiency anemia, unspecified: Secondary | ICD-10-CM | POA: Diagnosis not present

## 2015-08-26 DIAGNOSIS — N2581 Secondary hyperparathyroidism of renal origin: Secondary | ICD-10-CM | POA: Diagnosis not present

## 2015-08-26 DIAGNOSIS — Z992 Dependence on renal dialysis: Secondary | ICD-10-CM | POA: Diagnosis not present

## 2015-08-28 DIAGNOSIS — D509 Iron deficiency anemia, unspecified: Secondary | ICD-10-CM | POA: Diagnosis not present

## 2015-08-28 DIAGNOSIS — N186 End stage renal disease: Secondary | ICD-10-CM | POA: Diagnosis not present

## 2015-08-28 DIAGNOSIS — N2581 Secondary hyperparathyroidism of renal origin: Secondary | ICD-10-CM | POA: Diagnosis not present

## 2015-08-28 DIAGNOSIS — Z992 Dependence on renal dialysis: Secondary | ICD-10-CM | POA: Diagnosis not present

## 2015-08-30 DIAGNOSIS — N186 End stage renal disease: Secondary | ICD-10-CM | POA: Diagnosis not present

## 2015-08-30 DIAGNOSIS — N2581 Secondary hyperparathyroidism of renal origin: Secondary | ICD-10-CM | POA: Diagnosis not present

## 2015-08-30 DIAGNOSIS — D509 Iron deficiency anemia, unspecified: Secondary | ICD-10-CM | POA: Diagnosis not present

## 2015-08-30 DIAGNOSIS — Z992 Dependence on renal dialysis: Secondary | ICD-10-CM | POA: Diagnosis not present

## 2015-09-02 DIAGNOSIS — D509 Iron deficiency anemia, unspecified: Secondary | ICD-10-CM | POA: Diagnosis not present

## 2015-09-02 DIAGNOSIS — N2581 Secondary hyperparathyroidism of renal origin: Secondary | ICD-10-CM | POA: Diagnosis not present

## 2015-09-02 DIAGNOSIS — Z992 Dependence on renal dialysis: Secondary | ICD-10-CM | POA: Diagnosis not present

## 2015-09-02 DIAGNOSIS — N186 End stage renal disease: Secondary | ICD-10-CM | POA: Diagnosis not present

## 2015-09-04 DIAGNOSIS — Z992 Dependence on renal dialysis: Secondary | ICD-10-CM | POA: Diagnosis not present

## 2015-09-04 DIAGNOSIS — D509 Iron deficiency anemia, unspecified: Secondary | ICD-10-CM | POA: Diagnosis not present

## 2015-09-04 DIAGNOSIS — N186 End stage renal disease: Secondary | ICD-10-CM | POA: Diagnosis not present

## 2015-09-04 DIAGNOSIS — N2581 Secondary hyperparathyroidism of renal origin: Secondary | ICD-10-CM | POA: Diagnosis not present

## 2015-09-06 DIAGNOSIS — N186 End stage renal disease: Secondary | ICD-10-CM | POA: Diagnosis not present

## 2015-09-06 DIAGNOSIS — Z992 Dependence on renal dialysis: Secondary | ICD-10-CM | POA: Diagnosis not present

## 2015-09-06 DIAGNOSIS — N2581 Secondary hyperparathyroidism of renal origin: Secondary | ICD-10-CM | POA: Diagnosis not present

## 2015-09-06 DIAGNOSIS — D509 Iron deficiency anemia, unspecified: Secondary | ICD-10-CM | POA: Diagnosis not present

## 2015-09-09 DIAGNOSIS — Z992 Dependence on renal dialysis: Secondary | ICD-10-CM | POA: Diagnosis not present

## 2015-09-09 DIAGNOSIS — N186 End stage renal disease: Secondary | ICD-10-CM | POA: Diagnosis not present

## 2015-09-09 DIAGNOSIS — N2581 Secondary hyperparathyroidism of renal origin: Secondary | ICD-10-CM | POA: Diagnosis not present

## 2015-09-09 DIAGNOSIS — D509 Iron deficiency anemia, unspecified: Secondary | ICD-10-CM | POA: Diagnosis not present

## 2015-09-11 DIAGNOSIS — D509 Iron deficiency anemia, unspecified: Secondary | ICD-10-CM | POA: Diagnosis not present

## 2015-09-11 DIAGNOSIS — N186 End stage renal disease: Secondary | ICD-10-CM | POA: Diagnosis not present

## 2015-09-11 DIAGNOSIS — Z992 Dependence on renal dialysis: Secondary | ICD-10-CM | POA: Diagnosis not present

## 2015-09-11 DIAGNOSIS — N2581 Secondary hyperparathyroidism of renal origin: Secondary | ICD-10-CM | POA: Diagnosis not present

## 2015-09-13 DIAGNOSIS — N2581 Secondary hyperparathyroidism of renal origin: Secondary | ICD-10-CM | POA: Diagnosis not present

## 2015-09-13 DIAGNOSIS — Z992 Dependence on renal dialysis: Secondary | ICD-10-CM | POA: Diagnosis not present

## 2015-09-13 DIAGNOSIS — N186 End stage renal disease: Secondary | ICD-10-CM | POA: Diagnosis not present

## 2015-09-13 DIAGNOSIS — D509 Iron deficiency anemia, unspecified: Secondary | ICD-10-CM | POA: Diagnosis not present

## 2015-09-16 DIAGNOSIS — D509 Iron deficiency anemia, unspecified: Secondary | ICD-10-CM | POA: Diagnosis not present

## 2015-09-16 DIAGNOSIS — N2581 Secondary hyperparathyroidism of renal origin: Secondary | ICD-10-CM | POA: Diagnosis not present

## 2015-09-16 DIAGNOSIS — Z992 Dependence on renal dialysis: Secondary | ICD-10-CM | POA: Diagnosis not present

## 2015-09-16 DIAGNOSIS — N186 End stage renal disease: Secondary | ICD-10-CM | POA: Diagnosis not present

## 2015-09-18 DIAGNOSIS — Z992 Dependence on renal dialysis: Secondary | ICD-10-CM | POA: Diagnosis not present

## 2015-09-18 DIAGNOSIS — D509 Iron deficiency anemia, unspecified: Secondary | ICD-10-CM | POA: Diagnosis not present

## 2015-09-18 DIAGNOSIS — N186 End stage renal disease: Secondary | ICD-10-CM | POA: Diagnosis not present

## 2015-09-18 DIAGNOSIS — N2581 Secondary hyperparathyroidism of renal origin: Secondary | ICD-10-CM | POA: Diagnosis not present

## 2015-09-20 DIAGNOSIS — N186 End stage renal disease: Secondary | ICD-10-CM | POA: Diagnosis not present

## 2015-09-20 DIAGNOSIS — N2581 Secondary hyperparathyroidism of renal origin: Secondary | ICD-10-CM | POA: Diagnosis not present

## 2015-09-20 DIAGNOSIS — Z992 Dependence on renal dialysis: Secondary | ICD-10-CM | POA: Diagnosis not present

## 2015-09-20 DIAGNOSIS — D509 Iron deficiency anemia, unspecified: Secondary | ICD-10-CM | POA: Diagnosis not present

## 2015-09-23 DIAGNOSIS — D509 Iron deficiency anemia, unspecified: Secondary | ICD-10-CM | POA: Diagnosis not present

## 2015-09-23 DIAGNOSIS — N186 End stage renal disease: Secondary | ICD-10-CM | POA: Diagnosis not present

## 2015-09-23 DIAGNOSIS — N2581 Secondary hyperparathyroidism of renal origin: Secondary | ICD-10-CM | POA: Diagnosis not present

## 2015-09-23 DIAGNOSIS — Z992 Dependence on renal dialysis: Secondary | ICD-10-CM | POA: Diagnosis not present

## 2015-09-25 DIAGNOSIS — Z992 Dependence on renal dialysis: Secondary | ICD-10-CM | POA: Diagnosis not present

## 2015-09-25 DIAGNOSIS — N2581 Secondary hyperparathyroidism of renal origin: Secondary | ICD-10-CM | POA: Diagnosis not present

## 2015-09-25 DIAGNOSIS — D509 Iron deficiency anemia, unspecified: Secondary | ICD-10-CM | POA: Diagnosis not present

## 2015-09-25 DIAGNOSIS — N186 End stage renal disease: Secondary | ICD-10-CM | POA: Diagnosis not present

## 2015-09-26 DIAGNOSIS — N186 End stage renal disease: Secondary | ICD-10-CM | POA: Diagnosis not present

## 2015-09-26 DIAGNOSIS — Z992 Dependence on renal dialysis: Secondary | ICD-10-CM | POA: Diagnosis not present

## 2015-09-27 DIAGNOSIS — N2581 Secondary hyperparathyroidism of renal origin: Secondary | ICD-10-CM | POA: Diagnosis not present

## 2015-09-27 DIAGNOSIS — N186 End stage renal disease: Secondary | ICD-10-CM | POA: Diagnosis not present

## 2015-09-27 DIAGNOSIS — D509 Iron deficiency anemia, unspecified: Secondary | ICD-10-CM | POA: Diagnosis not present

## 2015-09-27 DIAGNOSIS — Z23 Encounter for immunization: Secondary | ICD-10-CM | POA: Diagnosis not present

## 2015-09-27 DIAGNOSIS — Z992 Dependence on renal dialysis: Secondary | ICD-10-CM | POA: Diagnosis not present

## 2015-09-30 DIAGNOSIS — N186 End stage renal disease: Secondary | ICD-10-CM | POA: Diagnosis not present

## 2015-09-30 DIAGNOSIS — N2581 Secondary hyperparathyroidism of renal origin: Secondary | ICD-10-CM | POA: Diagnosis not present

## 2015-09-30 DIAGNOSIS — Z992 Dependence on renal dialysis: Secondary | ICD-10-CM | POA: Diagnosis not present

## 2015-09-30 DIAGNOSIS — D509 Iron deficiency anemia, unspecified: Secondary | ICD-10-CM | POA: Diagnosis not present

## 2015-09-30 DIAGNOSIS — Z23 Encounter for immunization: Secondary | ICD-10-CM | POA: Diagnosis not present

## 2015-10-02 DIAGNOSIS — N2581 Secondary hyperparathyroidism of renal origin: Secondary | ICD-10-CM | POA: Diagnosis not present

## 2015-10-02 DIAGNOSIS — N186 End stage renal disease: Secondary | ICD-10-CM | POA: Diagnosis not present

## 2015-10-02 DIAGNOSIS — Z23 Encounter for immunization: Secondary | ICD-10-CM | POA: Diagnosis not present

## 2015-10-02 DIAGNOSIS — D509 Iron deficiency anemia, unspecified: Secondary | ICD-10-CM | POA: Diagnosis not present

## 2015-10-02 DIAGNOSIS — Z992 Dependence on renal dialysis: Secondary | ICD-10-CM | POA: Diagnosis not present

## 2015-10-04 DIAGNOSIS — D509 Iron deficiency anemia, unspecified: Secondary | ICD-10-CM | POA: Diagnosis not present

## 2015-10-04 DIAGNOSIS — N2581 Secondary hyperparathyroidism of renal origin: Secondary | ICD-10-CM | POA: Diagnosis not present

## 2015-10-04 DIAGNOSIS — Z992 Dependence on renal dialysis: Secondary | ICD-10-CM | POA: Diagnosis not present

## 2015-10-04 DIAGNOSIS — N186 End stage renal disease: Secondary | ICD-10-CM | POA: Diagnosis not present

## 2015-10-04 DIAGNOSIS — Z23 Encounter for immunization: Secondary | ICD-10-CM | POA: Diagnosis not present

## 2015-10-06 DIAGNOSIS — Z992 Dependence on renal dialysis: Secondary | ICD-10-CM | POA: Diagnosis not present

## 2015-10-06 DIAGNOSIS — N186 End stage renal disease: Secondary | ICD-10-CM | POA: Diagnosis not present

## 2015-10-06 DIAGNOSIS — N2581 Secondary hyperparathyroidism of renal origin: Secondary | ICD-10-CM | POA: Diagnosis not present

## 2015-10-06 DIAGNOSIS — Z23 Encounter for immunization: Secondary | ICD-10-CM | POA: Diagnosis not present

## 2015-10-06 DIAGNOSIS — D509 Iron deficiency anemia, unspecified: Secondary | ICD-10-CM | POA: Diagnosis not present

## 2015-10-09 DIAGNOSIS — Z23 Encounter for immunization: Secondary | ICD-10-CM | POA: Diagnosis not present

## 2015-10-09 DIAGNOSIS — N186 End stage renal disease: Secondary | ICD-10-CM | POA: Diagnosis not present

## 2015-10-09 DIAGNOSIS — D509 Iron deficiency anemia, unspecified: Secondary | ICD-10-CM | POA: Diagnosis not present

## 2015-10-09 DIAGNOSIS — N2581 Secondary hyperparathyroidism of renal origin: Secondary | ICD-10-CM | POA: Diagnosis not present

## 2015-10-09 DIAGNOSIS — Z992 Dependence on renal dialysis: Secondary | ICD-10-CM | POA: Diagnosis not present

## 2015-10-11 DIAGNOSIS — Z992 Dependence on renal dialysis: Secondary | ICD-10-CM | POA: Diagnosis not present

## 2015-10-11 DIAGNOSIS — D509 Iron deficiency anemia, unspecified: Secondary | ICD-10-CM | POA: Diagnosis not present

## 2015-10-11 DIAGNOSIS — N186 End stage renal disease: Secondary | ICD-10-CM | POA: Diagnosis not present

## 2015-10-11 DIAGNOSIS — Z23 Encounter for immunization: Secondary | ICD-10-CM | POA: Diagnosis not present

## 2015-10-11 DIAGNOSIS — N2581 Secondary hyperparathyroidism of renal origin: Secondary | ICD-10-CM | POA: Diagnosis not present

## 2015-10-14 DIAGNOSIS — Z23 Encounter for immunization: Secondary | ICD-10-CM | POA: Diagnosis not present

## 2015-10-14 DIAGNOSIS — D509 Iron deficiency anemia, unspecified: Secondary | ICD-10-CM | POA: Diagnosis not present

## 2015-10-14 DIAGNOSIS — N2581 Secondary hyperparathyroidism of renal origin: Secondary | ICD-10-CM | POA: Diagnosis not present

## 2015-10-14 DIAGNOSIS — Z992 Dependence on renal dialysis: Secondary | ICD-10-CM | POA: Diagnosis not present

## 2015-10-14 DIAGNOSIS — N186 End stage renal disease: Secondary | ICD-10-CM | POA: Diagnosis not present

## 2015-10-16 DIAGNOSIS — Z23 Encounter for immunization: Secondary | ICD-10-CM | POA: Diagnosis not present

## 2015-10-16 DIAGNOSIS — N186 End stage renal disease: Secondary | ICD-10-CM | POA: Diagnosis not present

## 2015-10-16 DIAGNOSIS — D509 Iron deficiency anemia, unspecified: Secondary | ICD-10-CM | POA: Diagnosis not present

## 2015-10-16 DIAGNOSIS — Z992 Dependence on renal dialysis: Secondary | ICD-10-CM | POA: Diagnosis not present

## 2015-10-16 DIAGNOSIS — N2581 Secondary hyperparathyroidism of renal origin: Secondary | ICD-10-CM | POA: Diagnosis not present

## 2015-10-18 DIAGNOSIS — Z992 Dependence on renal dialysis: Secondary | ICD-10-CM | POA: Diagnosis not present

## 2015-10-18 DIAGNOSIS — Z23 Encounter for immunization: Secondary | ICD-10-CM | POA: Diagnosis not present

## 2015-10-18 DIAGNOSIS — D509 Iron deficiency anemia, unspecified: Secondary | ICD-10-CM | POA: Diagnosis not present

## 2015-10-18 DIAGNOSIS — N186 End stage renal disease: Secondary | ICD-10-CM | POA: Diagnosis not present

## 2015-10-18 DIAGNOSIS — N2581 Secondary hyperparathyroidism of renal origin: Secondary | ICD-10-CM | POA: Diagnosis not present

## 2015-10-21 DIAGNOSIS — N186 End stage renal disease: Secondary | ICD-10-CM | POA: Diagnosis not present

## 2015-10-21 DIAGNOSIS — Z992 Dependence on renal dialysis: Secondary | ICD-10-CM | POA: Diagnosis not present

## 2015-10-21 DIAGNOSIS — D509 Iron deficiency anemia, unspecified: Secondary | ICD-10-CM | POA: Diagnosis not present

## 2015-10-21 DIAGNOSIS — Z23 Encounter for immunization: Secondary | ICD-10-CM | POA: Diagnosis not present

## 2015-10-21 DIAGNOSIS — N2581 Secondary hyperparathyroidism of renal origin: Secondary | ICD-10-CM | POA: Diagnosis not present

## 2015-10-23 DIAGNOSIS — Z23 Encounter for immunization: Secondary | ICD-10-CM | POA: Diagnosis not present

## 2015-10-23 DIAGNOSIS — N186 End stage renal disease: Secondary | ICD-10-CM | POA: Diagnosis not present

## 2015-10-23 DIAGNOSIS — D509 Iron deficiency anemia, unspecified: Secondary | ICD-10-CM | POA: Diagnosis not present

## 2015-10-23 DIAGNOSIS — N2581 Secondary hyperparathyroidism of renal origin: Secondary | ICD-10-CM | POA: Diagnosis not present

## 2015-10-23 DIAGNOSIS — Z992 Dependence on renal dialysis: Secondary | ICD-10-CM | POA: Diagnosis not present

## 2015-10-25 DIAGNOSIS — D509 Iron deficiency anemia, unspecified: Secondary | ICD-10-CM | POA: Diagnosis not present

## 2015-10-25 DIAGNOSIS — Z992 Dependence on renal dialysis: Secondary | ICD-10-CM | POA: Diagnosis not present

## 2015-10-25 DIAGNOSIS — N2581 Secondary hyperparathyroidism of renal origin: Secondary | ICD-10-CM | POA: Diagnosis not present

## 2015-10-25 DIAGNOSIS — N186 End stage renal disease: Secondary | ICD-10-CM | POA: Diagnosis not present

## 2015-10-25 DIAGNOSIS — Z23 Encounter for immunization: Secondary | ICD-10-CM | POA: Diagnosis not present

## 2015-10-26 DIAGNOSIS — Z992 Dependence on renal dialysis: Secondary | ICD-10-CM | POA: Diagnosis not present

## 2015-10-26 DIAGNOSIS — N186 End stage renal disease: Secondary | ICD-10-CM | POA: Diagnosis not present

## 2015-10-28 DIAGNOSIS — D509 Iron deficiency anemia, unspecified: Secondary | ICD-10-CM | POA: Diagnosis not present

## 2015-10-28 DIAGNOSIS — Z992 Dependence on renal dialysis: Secondary | ICD-10-CM | POA: Diagnosis not present

## 2015-10-28 DIAGNOSIS — N2581 Secondary hyperparathyroidism of renal origin: Secondary | ICD-10-CM | POA: Diagnosis not present

## 2015-10-28 DIAGNOSIS — N186 End stage renal disease: Secondary | ICD-10-CM | POA: Diagnosis not present

## 2015-10-30 DIAGNOSIS — N2581 Secondary hyperparathyroidism of renal origin: Secondary | ICD-10-CM | POA: Diagnosis not present

## 2015-10-30 DIAGNOSIS — Z992 Dependence on renal dialysis: Secondary | ICD-10-CM | POA: Diagnosis not present

## 2015-10-30 DIAGNOSIS — N186 End stage renal disease: Secondary | ICD-10-CM | POA: Diagnosis not present

## 2015-10-30 DIAGNOSIS — D509 Iron deficiency anemia, unspecified: Secondary | ICD-10-CM | POA: Diagnosis not present

## 2015-11-01 DIAGNOSIS — N2581 Secondary hyperparathyroidism of renal origin: Secondary | ICD-10-CM | POA: Diagnosis not present

## 2015-11-01 DIAGNOSIS — D509 Iron deficiency anemia, unspecified: Secondary | ICD-10-CM | POA: Diagnosis not present

## 2015-11-01 DIAGNOSIS — N186 End stage renal disease: Secondary | ICD-10-CM | POA: Diagnosis not present

## 2015-11-01 DIAGNOSIS — Z992 Dependence on renal dialysis: Secondary | ICD-10-CM | POA: Diagnosis not present

## 2015-11-04 DIAGNOSIS — Z992 Dependence on renal dialysis: Secondary | ICD-10-CM | POA: Diagnosis not present

## 2015-11-04 DIAGNOSIS — D509 Iron deficiency anemia, unspecified: Secondary | ICD-10-CM | POA: Diagnosis not present

## 2015-11-04 DIAGNOSIS — N2581 Secondary hyperparathyroidism of renal origin: Secondary | ICD-10-CM | POA: Diagnosis not present

## 2015-11-04 DIAGNOSIS — N186 End stage renal disease: Secondary | ICD-10-CM | POA: Diagnosis not present

## 2015-11-06 DIAGNOSIS — Z992 Dependence on renal dialysis: Secondary | ICD-10-CM | POA: Diagnosis not present

## 2015-11-06 DIAGNOSIS — N2581 Secondary hyperparathyroidism of renal origin: Secondary | ICD-10-CM | POA: Diagnosis not present

## 2015-11-06 DIAGNOSIS — N186 End stage renal disease: Secondary | ICD-10-CM | POA: Diagnosis not present

## 2015-11-06 DIAGNOSIS — D509 Iron deficiency anemia, unspecified: Secondary | ICD-10-CM | POA: Diagnosis not present

## 2015-11-08 DIAGNOSIS — N2581 Secondary hyperparathyroidism of renal origin: Secondary | ICD-10-CM | POA: Diagnosis not present

## 2015-11-08 DIAGNOSIS — Z992 Dependence on renal dialysis: Secondary | ICD-10-CM | POA: Diagnosis not present

## 2015-11-08 DIAGNOSIS — D509 Iron deficiency anemia, unspecified: Secondary | ICD-10-CM | POA: Diagnosis not present

## 2015-11-08 DIAGNOSIS — N186 End stage renal disease: Secondary | ICD-10-CM | POA: Diagnosis not present

## 2015-11-11 DIAGNOSIS — Z992 Dependence on renal dialysis: Secondary | ICD-10-CM | POA: Diagnosis not present

## 2015-11-11 DIAGNOSIS — N2581 Secondary hyperparathyroidism of renal origin: Secondary | ICD-10-CM | POA: Diagnosis not present

## 2015-11-11 DIAGNOSIS — N186 End stage renal disease: Secondary | ICD-10-CM | POA: Diagnosis not present

## 2015-11-11 DIAGNOSIS — D509 Iron deficiency anemia, unspecified: Secondary | ICD-10-CM | POA: Diagnosis not present

## 2015-11-13 DIAGNOSIS — Z992 Dependence on renal dialysis: Secondary | ICD-10-CM | POA: Diagnosis not present

## 2015-11-13 DIAGNOSIS — N186 End stage renal disease: Secondary | ICD-10-CM | POA: Diagnosis not present

## 2015-11-13 DIAGNOSIS — D509 Iron deficiency anemia, unspecified: Secondary | ICD-10-CM | POA: Diagnosis not present

## 2015-11-13 DIAGNOSIS — N2581 Secondary hyperparathyroidism of renal origin: Secondary | ICD-10-CM | POA: Diagnosis not present

## 2015-11-15 DIAGNOSIS — Z992 Dependence on renal dialysis: Secondary | ICD-10-CM | POA: Diagnosis not present

## 2015-11-15 DIAGNOSIS — N186 End stage renal disease: Secondary | ICD-10-CM | POA: Diagnosis not present

## 2015-11-15 DIAGNOSIS — D509 Iron deficiency anemia, unspecified: Secondary | ICD-10-CM | POA: Diagnosis not present

## 2015-11-15 DIAGNOSIS — N2581 Secondary hyperparathyroidism of renal origin: Secondary | ICD-10-CM | POA: Diagnosis not present

## 2015-11-18 DIAGNOSIS — N186 End stage renal disease: Secondary | ICD-10-CM | POA: Diagnosis not present

## 2015-11-18 DIAGNOSIS — Z992 Dependence on renal dialysis: Secondary | ICD-10-CM | POA: Diagnosis not present

## 2015-11-18 DIAGNOSIS — N2581 Secondary hyperparathyroidism of renal origin: Secondary | ICD-10-CM | POA: Diagnosis not present

## 2015-11-18 DIAGNOSIS — D509 Iron deficiency anemia, unspecified: Secondary | ICD-10-CM | POA: Diagnosis not present

## 2015-11-20 DIAGNOSIS — N2581 Secondary hyperparathyroidism of renal origin: Secondary | ICD-10-CM | POA: Diagnosis not present

## 2015-11-20 DIAGNOSIS — Z992 Dependence on renal dialysis: Secondary | ICD-10-CM | POA: Diagnosis not present

## 2015-11-20 DIAGNOSIS — N186 End stage renal disease: Secondary | ICD-10-CM | POA: Diagnosis not present

## 2015-11-20 DIAGNOSIS — D509 Iron deficiency anemia, unspecified: Secondary | ICD-10-CM | POA: Diagnosis not present

## 2015-11-22 DIAGNOSIS — N2581 Secondary hyperparathyroidism of renal origin: Secondary | ICD-10-CM | POA: Diagnosis not present

## 2015-11-22 DIAGNOSIS — Z992 Dependence on renal dialysis: Secondary | ICD-10-CM | POA: Diagnosis not present

## 2015-11-22 DIAGNOSIS — N186 End stage renal disease: Secondary | ICD-10-CM | POA: Diagnosis not present

## 2015-11-22 DIAGNOSIS — D509 Iron deficiency anemia, unspecified: Secondary | ICD-10-CM | POA: Diagnosis not present

## 2015-11-25 DIAGNOSIS — N186 End stage renal disease: Secondary | ICD-10-CM | POA: Diagnosis not present

## 2015-11-25 DIAGNOSIS — Z992 Dependence on renal dialysis: Secondary | ICD-10-CM | POA: Diagnosis not present

## 2015-11-25 DIAGNOSIS — N2581 Secondary hyperparathyroidism of renal origin: Secondary | ICD-10-CM | POA: Diagnosis not present

## 2015-11-25 DIAGNOSIS — D509 Iron deficiency anemia, unspecified: Secondary | ICD-10-CM | POA: Diagnosis not present

## 2015-11-26 DIAGNOSIS — Z992 Dependence on renal dialysis: Secondary | ICD-10-CM | POA: Diagnosis not present

## 2015-11-26 DIAGNOSIS — N186 End stage renal disease: Secondary | ICD-10-CM | POA: Diagnosis not present

## 2015-11-27 DIAGNOSIS — N2581 Secondary hyperparathyroidism of renal origin: Secondary | ICD-10-CM | POA: Diagnosis not present

## 2015-11-27 DIAGNOSIS — D509 Iron deficiency anemia, unspecified: Secondary | ICD-10-CM | POA: Diagnosis not present

## 2015-11-27 DIAGNOSIS — N186 End stage renal disease: Secondary | ICD-10-CM | POA: Diagnosis not present

## 2015-11-27 DIAGNOSIS — Z992 Dependence on renal dialysis: Secondary | ICD-10-CM | POA: Diagnosis not present

## 2015-11-29 DIAGNOSIS — N186 End stage renal disease: Secondary | ICD-10-CM | POA: Diagnosis not present

## 2015-11-29 DIAGNOSIS — Z992 Dependence on renal dialysis: Secondary | ICD-10-CM | POA: Diagnosis not present

## 2015-11-29 DIAGNOSIS — N2581 Secondary hyperparathyroidism of renal origin: Secondary | ICD-10-CM | POA: Diagnosis not present

## 2015-11-29 DIAGNOSIS — D509 Iron deficiency anemia, unspecified: Secondary | ICD-10-CM | POA: Diagnosis not present

## 2015-12-02 DIAGNOSIS — N186 End stage renal disease: Secondary | ICD-10-CM | POA: Diagnosis not present

## 2015-12-02 DIAGNOSIS — Z992 Dependence on renal dialysis: Secondary | ICD-10-CM | POA: Diagnosis not present

## 2015-12-02 DIAGNOSIS — N2581 Secondary hyperparathyroidism of renal origin: Secondary | ICD-10-CM | POA: Diagnosis not present

## 2015-12-02 DIAGNOSIS — D509 Iron deficiency anemia, unspecified: Secondary | ICD-10-CM | POA: Diagnosis not present

## 2015-12-04 DIAGNOSIS — Z992 Dependence on renal dialysis: Secondary | ICD-10-CM | POA: Diagnosis not present

## 2015-12-04 DIAGNOSIS — N2581 Secondary hyperparathyroidism of renal origin: Secondary | ICD-10-CM | POA: Diagnosis not present

## 2015-12-04 DIAGNOSIS — D509 Iron deficiency anemia, unspecified: Secondary | ICD-10-CM | POA: Diagnosis not present

## 2015-12-04 DIAGNOSIS — N186 End stage renal disease: Secondary | ICD-10-CM | POA: Diagnosis not present

## 2015-12-06 DIAGNOSIS — Z992 Dependence on renal dialysis: Secondary | ICD-10-CM | POA: Diagnosis not present

## 2015-12-06 DIAGNOSIS — N186 End stage renal disease: Secondary | ICD-10-CM | POA: Diagnosis not present

## 2015-12-06 DIAGNOSIS — D509 Iron deficiency anemia, unspecified: Secondary | ICD-10-CM | POA: Diagnosis not present

## 2015-12-06 DIAGNOSIS — N2581 Secondary hyperparathyroidism of renal origin: Secondary | ICD-10-CM | POA: Diagnosis not present

## 2015-12-09 DIAGNOSIS — Z992 Dependence on renal dialysis: Secondary | ICD-10-CM | POA: Diagnosis not present

## 2015-12-09 DIAGNOSIS — N2581 Secondary hyperparathyroidism of renal origin: Secondary | ICD-10-CM | POA: Diagnosis not present

## 2015-12-09 DIAGNOSIS — D509 Iron deficiency anemia, unspecified: Secondary | ICD-10-CM | POA: Diagnosis not present

## 2015-12-09 DIAGNOSIS — N186 End stage renal disease: Secondary | ICD-10-CM | POA: Diagnosis not present

## 2015-12-11 DIAGNOSIS — D509 Iron deficiency anemia, unspecified: Secondary | ICD-10-CM | POA: Diagnosis not present

## 2015-12-11 DIAGNOSIS — N2581 Secondary hyperparathyroidism of renal origin: Secondary | ICD-10-CM | POA: Diagnosis not present

## 2015-12-11 DIAGNOSIS — Z992 Dependence on renal dialysis: Secondary | ICD-10-CM | POA: Diagnosis not present

## 2015-12-11 DIAGNOSIS — N186 End stage renal disease: Secondary | ICD-10-CM | POA: Diagnosis not present

## 2015-12-13 DIAGNOSIS — Z992 Dependence on renal dialysis: Secondary | ICD-10-CM | POA: Diagnosis not present

## 2015-12-13 DIAGNOSIS — D509 Iron deficiency anemia, unspecified: Secondary | ICD-10-CM | POA: Diagnosis not present

## 2015-12-13 DIAGNOSIS — N2581 Secondary hyperparathyroidism of renal origin: Secondary | ICD-10-CM | POA: Diagnosis not present

## 2015-12-13 DIAGNOSIS — N186 End stage renal disease: Secondary | ICD-10-CM | POA: Diagnosis not present

## 2015-12-16 DIAGNOSIS — Z992 Dependence on renal dialysis: Secondary | ICD-10-CM | POA: Diagnosis not present

## 2015-12-16 DIAGNOSIS — N2581 Secondary hyperparathyroidism of renal origin: Secondary | ICD-10-CM | POA: Diagnosis not present

## 2015-12-16 DIAGNOSIS — D509 Iron deficiency anemia, unspecified: Secondary | ICD-10-CM | POA: Diagnosis not present

## 2015-12-16 DIAGNOSIS — N186 End stage renal disease: Secondary | ICD-10-CM | POA: Diagnosis not present

## 2015-12-18 DIAGNOSIS — D509 Iron deficiency anemia, unspecified: Secondary | ICD-10-CM | POA: Diagnosis not present

## 2015-12-18 DIAGNOSIS — N186 End stage renal disease: Secondary | ICD-10-CM | POA: Diagnosis not present

## 2015-12-18 DIAGNOSIS — N2581 Secondary hyperparathyroidism of renal origin: Secondary | ICD-10-CM | POA: Diagnosis not present

## 2015-12-18 DIAGNOSIS — Z992 Dependence on renal dialysis: Secondary | ICD-10-CM | POA: Diagnosis not present

## 2015-12-20 DIAGNOSIS — N2581 Secondary hyperparathyroidism of renal origin: Secondary | ICD-10-CM | POA: Diagnosis not present

## 2015-12-20 DIAGNOSIS — N186 End stage renal disease: Secondary | ICD-10-CM | POA: Diagnosis not present

## 2015-12-20 DIAGNOSIS — Z992 Dependence on renal dialysis: Secondary | ICD-10-CM | POA: Diagnosis not present

## 2015-12-20 DIAGNOSIS — D509 Iron deficiency anemia, unspecified: Secondary | ICD-10-CM | POA: Diagnosis not present

## 2015-12-23 DIAGNOSIS — N2581 Secondary hyperparathyroidism of renal origin: Secondary | ICD-10-CM | POA: Diagnosis not present

## 2015-12-23 DIAGNOSIS — N186 End stage renal disease: Secondary | ICD-10-CM | POA: Diagnosis not present

## 2015-12-23 DIAGNOSIS — D509 Iron deficiency anemia, unspecified: Secondary | ICD-10-CM | POA: Diagnosis not present

## 2015-12-23 DIAGNOSIS — Z992 Dependence on renal dialysis: Secondary | ICD-10-CM | POA: Diagnosis not present

## 2015-12-23 IMAGING — NM NM MYOCAR SINGLE W/SPECT W/WALL MOTION & EF
2 series · 12 of 12 positions shown · non-contrast
Comparison: None.

CLINICAL DATA: 42-year-old male. With a known history of coronary
artery disease referred for chest pain.

EXAM:
MYOCARDIAL IMAGING WITH SPECT (REST AND PHARMACOLOGIC-STRESS)
GATED LEFT VENTRICULAR WALL MOTION STUDY
LEFT VENTRICULAR EJECTION FRACTION
TECHNIQUE: Standard myocardial SPECT imaging was performed after resting
intravenous injection of 10 mCi 9c-DDm sestamibi. Subsequently,
intravenous infusion of Lexiscan was performed under the supervision
of the Cardiology staff. At peak effect of the drug, 30 mCi 9c-DDm
sestamibi was injected intravenously and standard myocardial SPECT
imaging was performed. Quantitative gated imaging was also performed
to evaluate left ventricular wall motion, and estimate left
ventricular ejection fraction.

[Series 1: rest · 8.28mm/px · 6 of 64 frames shown]
[frame 6/64]
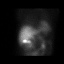
[frame 16/64]
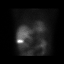
[frame 27/64]
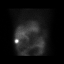
[frame 38/64]
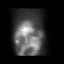
[frame 48/64]
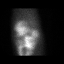
[frame 59/64]
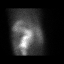

[Series 2: stress gated · 8.28mm/px · 6 of 64 frames shown]
[frame 6/64]
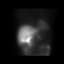
[frame 16/64]
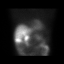
[frame 27/64]
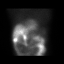
[frame 38/64]
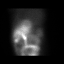
[frame 48/64]
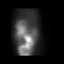
[frame 59/64]
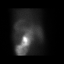

[12 of 12 positions shown; findings below may reference images not displayed]

FINDINGS: Pharmacological stress

Baseline EKG shows sinus rhythm, diffuse ST depressions and T-wave
inversion in the lateral limb and also the lateral precordial leads.
After injection heart rate increased from 55 beats per min up to 129
beats per min, and blood pressure increased from 137/96 up to
179/84. The test was stopped after injection was complete, the
patient did not experience any chest pain.

Post-injection EKG showed sinus tachycardia with unchanged baseline
ST/T changes. There were no significant arrhythmias.

Myocardial perfusion imaging

Raw images showed appropriate radiotracer uptake. There was a
moderate-sized mid to basal inferior wall defect, this defect was
more prominent in the resting images. The inferior wall had normal
wall motion. Overall findings are consistent with sub- diaphragmatic
attenuation. There were no other myocardial perfusion defects.

The gated imaging showed end-diastolic volume 134 mL, end systolic
volume 63 mL, left ventricular ejection fraction 53%.
IMPRESSION: 1.  Negative Lexiscan MPI for ischemia

2.  Normal LV systolic function, LVEF 53%

3.  Low risk study for major cardiac events

## 2015-12-25 DIAGNOSIS — Z992 Dependence on renal dialysis: Secondary | ICD-10-CM | POA: Diagnosis not present

## 2015-12-25 DIAGNOSIS — N186 End stage renal disease: Secondary | ICD-10-CM | POA: Diagnosis not present

## 2015-12-25 DIAGNOSIS — D509 Iron deficiency anemia, unspecified: Secondary | ICD-10-CM | POA: Diagnosis not present

## 2015-12-25 DIAGNOSIS — N2581 Secondary hyperparathyroidism of renal origin: Secondary | ICD-10-CM | POA: Diagnosis not present

## 2015-12-26 DIAGNOSIS — N186 End stage renal disease: Secondary | ICD-10-CM | POA: Diagnosis not present

## 2015-12-26 DIAGNOSIS — Z992 Dependence on renal dialysis: Secondary | ICD-10-CM | POA: Diagnosis not present

## 2015-12-27 DIAGNOSIS — N186 End stage renal disease: Secondary | ICD-10-CM | POA: Diagnosis not present

## 2015-12-27 DIAGNOSIS — Z992 Dependence on renal dialysis: Secondary | ICD-10-CM | POA: Diagnosis not present

## 2015-12-27 DIAGNOSIS — N2581 Secondary hyperparathyroidism of renal origin: Secondary | ICD-10-CM | POA: Diagnosis not present

## 2015-12-27 DIAGNOSIS — D509 Iron deficiency anemia, unspecified: Secondary | ICD-10-CM | POA: Diagnosis not present

## 2015-12-30 DIAGNOSIS — Z992 Dependence on renal dialysis: Secondary | ICD-10-CM | POA: Diagnosis not present

## 2015-12-30 DIAGNOSIS — D509 Iron deficiency anemia, unspecified: Secondary | ICD-10-CM | POA: Diagnosis not present

## 2015-12-30 DIAGNOSIS — N2581 Secondary hyperparathyroidism of renal origin: Secondary | ICD-10-CM | POA: Diagnosis not present

## 2015-12-30 DIAGNOSIS — N186 End stage renal disease: Secondary | ICD-10-CM | POA: Diagnosis not present

## 2016-01-01 DIAGNOSIS — N2581 Secondary hyperparathyroidism of renal origin: Secondary | ICD-10-CM | POA: Diagnosis not present

## 2016-01-01 DIAGNOSIS — Z992 Dependence on renal dialysis: Secondary | ICD-10-CM | POA: Diagnosis not present

## 2016-01-01 DIAGNOSIS — N186 End stage renal disease: Secondary | ICD-10-CM | POA: Diagnosis not present

## 2016-01-01 DIAGNOSIS — D509 Iron deficiency anemia, unspecified: Secondary | ICD-10-CM | POA: Diagnosis not present

## 2016-01-03 DIAGNOSIS — N2581 Secondary hyperparathyroidism of renal origin: Secondary | ICD-10-CM | POA: Diagnosis not present

## 2016-01-03 DIAGNOSIS — Z992 Dependence on renal dialysis: Secondary | ICD-10-CM | POA: Diagnosis not present

## 2016-01-03 DIAGNOSIS — D509 Iron deficiency anemia, unspecified: Secondary | ICD-10-CM | POA: Diagnosis not present

## 2016-01-03 DIAGNOSIS — N186 End stage renal disease: Secondary | ICD-10-CM | POA: Diagnosis not present

## 2016-01-06 DIAGNOSIS — D509 Iron deficiency anemia, unspecified: Secondary | ICD-10-CM | POA: Diagnosis not present

## 2016-01-06 DIAGNOSIS — N186 End stage renal disease: Secondary | ICD-10-CM | POA: Diagnosis not present

## 2016-01-06 DIAGNOSIS — Z992 Dependence on renal dialysis: Secondary | ICD-10-CM | POA: Diagnosis not present

## 2016-01-06 DIAGNOSIS — N2581 Secondary hyperparathyroidism of renal origin: Secondary | ICD-10-CM | POA: Diagnosis not present

## 2016-01-08 DIAGNOSIS — N186 End stage renal disease: Secondary | ICD-10-CM | POA: Diagnosis not present

## 2016-01-08 DIAGNOSIS — N2581 Secondary hyperparathyroidism of renal origin: Secondary | ICD-10-CM | POA: Diagnosis not present

## 2016-01-08 DIAGNOSIS — Z992 Dependence on renal dialysis: Secondary | ICD-10-CM | POA: Diagnosis not present

## 2016-01-08 DIAGNOSIS — D509 Iron deficiency anemia, unspecified: Secondary | ICD-10-CM | POA: Diagnosis not present

## 2016-01-10 DIAGNOSIS — N186 End stage renal disease: Secondary | ICD-10-CM | POA: Diagnosis not present

## 2016-01-10 DIAGNOSIS — Z992 Dependence on renal dialysis: Secondary | ICD-10-CM | POA: Diagnosis not present

## 2016-01-10 DIAGNOSIS — D509 Iron deficiency anemia, unspecified: Secondary | ICD-10-CM | POA: Diagnosis not present

## 2016-01-10 DIAGNOSIS — N2581 Secondary hyperparathyroidism of renal origin: Secondary | ICD-10-CM | POA: Diagnosis not present

## 2016-01-13 DIAGNOSIS — Z992 Dependence on renal dialysis: Secondary | ICD-10-CM | POA: Diagnosis not present

## 2016-01-13 DIAGNOSIS — N186 End stage renal disease: Secondary | ICD-10-CM | POA: Diagnosis not present

## 2016-01-13 DIAGNOSIS — D509 Iron deficiency anemia, unspecified: Secondary | ICD-10-CM | POA: Diagnosis not present

## 2016-01-13 DIAGNOSIS — N2581 Secondary hyperparathyroidism of renal origin: Secondary | ICD-10-CM | POA: Diagnosis not present

## 2016-01-15 DIAGNOSIS — Z992 Dependence on renal dialysis: Secondary | ICD-10-CM | POA: Diagnosis not present

## 2016-01-15 DIAGNOSIS — N2581 Secondary hyperparathyroidism of renal origin: Secondary | ICD-10-CM | POA: Diagnosis not present

## 2016-01-15 DIAGNOSIS — D509 Iron deficiency anemia, unspecified: Secondary | ICD-10-CM | POA: Diagnosis not present

## 2016-01-15 DIAGNOSIS — N186 End stage renal disease: Secondary | ICD-10-CM | POA: Diagnosis not present

## 2016-01-17 DIAGNOSIS — Z992 Dependence on renal dialysis: Secondary | ICD-10-CM | POA: Diagnosis not present

## 2016-01-17 DIAGNOSIS — N2581 Secondary hyperparathyroidism of renal origin: Secondary | ICD-10-CM | POA: Diagnosis not present

## 2016-01-17 DIAGNOSIS — N186 End stage renal disease: Secondary | ICD-10-CM | POA: Diagnosis not present

## 2016-01-17 DIAGNOSIS — D509 Iron deficiency anemia, unspecified: Secondary | ICD-10-CM | POA: Diagnosis not present

## 2016-01-19 DIAGNOSIS — N186 End stage renal disease: Secondary | ICD-10-CM | POA: Diagnosis not present

## 2016-01-19 DIAGNOSIS — D509 Iron deficiency anemia, unspecified: Secondary | ICD-10-CM | POA: Diagnosis not present

## 2016-01-19 DIAGNOSIS — Z992 Dependence on renal dialysis: Secondary | ICD-10-CM | POA: Diagnosis not present

## 2016-01-19 DIAGNOSIS — N2581 Secondary hyperparathyroidism of renal origin: Secondary | ICD-10-CM | POA: Diagnosis not present

## 2016-01-22 DIAGNOSIS — D509 Iron deficiency anemia, unspecified: Secondary | ICD-10-CM | POA: Diagnosis not present

## 2016-01-22 DIAGNOSIS — Z992 Dependence on renal dialysis: Secondary | ICD-10-CM | POA: Diagnosis not present

## 2016-01-22 DIAGNOSIS — N186 End stage renal disease: Secondary | ICD-10-CM | POA: Diagnosis not present

## 2016-01-22 DIAGNOSIS — N2581 Secondary hyperparathyroidism of renal origin: Secondary | ICD-10-CM | POA: Diagnosis not present

## 2016-01-24 DIAGNOSIS — N186 End stage renal disease: Secondary | ICD-10-CM | POA: Diagnosis not present

## 2016-01-24 DIAGNOSIS — Z992 Dependence on renal dialysis: Secondary | ICD-10-CM | POA: Diagnosis not present

## 2016-01-24 DIAGNOSIS — D509 Iron deficiency anemia, unspecified: Secondary | ICD-10-CM | POA: Diagnosis not present

## 2016-01-24 DIAGNOSIS — N2581 Secondary hyperparathyroidism of renal origin: Secondary | ICD-10-CM | POA: Diagnosis not present

## 2016-01-26 DIAGNOSIS — N186 End stage renal disease: Secondary | ICD-10-CM | POA: Diagnosis not present

## 2016-01-27 DIAGNOSIS — N186 End stage renal disease: Secondary | ICD-10-CM | POA: Diagnosis not present

## 2016-01-27 DIAGNOSIS — Z23 Encounter for immunization: Secondary | ICD-10-CM | POA: Diagnosis not present

## 2016-01-27 DIAGNOSIS — D509 Iron deficiency anemia, unspecified: Secondary | ICD-10-CM | POA: Diagnosis not present

## 2016-01-27 DIAGNOSIS — N2581 Secondary hyperparathyroidism of renal origin: Secondary | ICD-10-CM | POA: Diagnosis not present

## 2016-01-27 DIAGNOSIS — Z992 Dependence on renal dialysis: Secondary | ICD-10-CM | POA: Diagnosis not present

## 2016-01-29 DIAGNOSIS — N186 End stage renal disease: Secondary | ICD-10-CM | POA: Diagnosis not present

## 2016-01-29 DIAGNOSIS — N2581 Secondary hyperparathyroidism of renal origin: Secondary | ICD-10-CM | POA: Diagnosis not present

## 2016-01-29 DIAGNOSIS — D509 Iron deficiency anemia, unspecified: Secondary | ICD-10-CM | POA: Diagnosis not present

## 2016-01-29 DIAGNOSIS — Z23 Encounter for immunization: Secondary | ICD-10-CM | POA: Diagnosis not present

## 2016-01-29 DIAGNOSIS — Z992 Dependence on renal dialysis: Secondary | ICD-10-CM | POA: Diagnosis not present

## 2016-01-31 DIAGNOSIS — Z992 Dependence on renal dialysis: Secondary | ICD-10-CM | POA: Diagnosis not present

## 2016-01-31 DIAGNOSIS — Z23 Encounter for immunization: Secondary | ICD-10-CM | POA: Diagnosis not present

## 2016-01-31 DIAGNOSIS — N186 End stage renal disease: Secondary | ICD-10-CM | POA: Diagnosis not present

## 2016-01-31 DIAGNOSIS — N2581 Secondary hyperparathyroidism of renal origin: Secondary | ICD-10-CM | POA: Diagnosis not present

## 2016-01-31 DIAGNOSIS — D509 Iron deficiency anemia, unspecified: Secondary | ICD-10-CM | POA: Diagnosis not present

## 2016-02-03 DIAGNOSIS — N186 End stage renal disease: Secondary | ICD-10-CM | POA: Diagnosis not present

## 2016-02-03 DIAGNOSIS — Z23 Encounter for immunization: Secondary | ICD-10-CM | POA: Diagnosis not present

## 2016-02-03 DIAGNOSIS — N2581 Secondary hyperparathyroidism of renal origin: Secondary | ICD-10-CM | POA: Diagnosis not present

## 2016-02-03 DIAGNOSIS — Z992 Dependence on renal dialysis: Secondary | ICD-10-CM | POA: Diagnosis not present

## 2016-02-03 DIAGNOSIS — D509 Iron deficiency anemia, unspecified: Secondary | ICD-10-CM | POA: Diagnosis not present

## 2016-02-05 DIAGNOSIS — N2581 Secondary hyperparathyroidism of renal origin: Secondary | ICD-10-CM | POA: Diagnosis not present

## 2016-02-05 DIAGNOSIS — N186 End stage renal disease: Secondary | ICD-10-CM | POA: Diagnosis not present

## 2016-02-05 DIAGNOSIS — D509 Iron deficiency anemia, unspecified: Secondary | ICD-10-CM | POA: Diagnosis not present

## 2016-02-05 DIAGNOSIS — Z992 Dependence on renal dialysis: Secondary | ICD-10-CM | POA: Diagnosis not present

## 2016-02-05 DIAGNOSIS — Z23 Encounter for immunization: Secondary | ICD-10-CM | POA: Diagnosis not present

## 2016-02-07 DIAGNOSIS — D509 Iron deficiency anemia, unspecified: Secondary | ICD-10-CM | POA: Diagnosis not present

## 2016-02-07 DIAGNOSIS — N186 End stage renal disease: Secondary | ICD-10-CM | POA: Diagnosis not present

## 2016-02-07 DIAGNOSIS — Z23 Encounter for immunization: Secondary | ICD-10-CM | POA: Diagnosis not present

## 2016-02-07 DIAGNOSIS — Z992 Dependence on renal dialysis: Secondary | ICD-10-CM | POA: Diagnosis not present

## 2016-02-07 DIAGNOSIS — N2581 Secondary hyperparathyroidism of renal origin: Secondary | ICD-10-CM | POA: Diagnosis not present

## 2016-02-10 DIAGNOSIS — Z992 Dependence on renal dialysis: Secondary | ICD-10-CM | POA: Diagnosis not present

## 2016-02-10 DIAGNOSIS — N2581 Secondary hyperparathyroidism of renal origin: Secondary | ICD-10-CM | POA: Diagnosis not present

## 2016-02-10 DIAGNOSIS — N186 End stage renal disease: Secondary | ICD-10-CM | POA: Diagnosis not present

## 2016-02-10 DIAGNOSIS — Z23 Encounter for immunization: Secondary | ICD-10-CM | POA: Diagnosis not present

## 2016-02-10 DIAGNOSIS — D509 Iron deficiency anemia, unspecified: Secondary | ICD-10-CM | POA: Diagnosis not present

## 2016-02-11 DIAGNOSIS — N186 End stage renal disease: Secondary | ICD-10-CM | POA: Diagnosis not present

## 2016-02-11 DIAGNOSIS — Z23 Encounter for immunization: Secondary | ICD-10-CM | POA: Diagnosis not present

## 2016-02-11 DIAGNOSIS — Z992 Dependence on renal dialysis: Secondary | ICD-10-CM | POA: Diagnosis not present

## 2016-02-11 DIAGNOSIS — D509 Iron deficiency anemia, unspecified: Secondary | ICD-10-CM | POA: Diagnosis not present

## 2016-02-11 DIAGNOSIS — N2581 Secondary hyperparathyroidism of renal origin: Secondary | ICD-10-CM | POA: Diagnosis not present

## 2016-02-12 DIAGNOSIS — D509 Iron deficiency anemia, unspecified: Secondary | ICD-10-CM | POA: Diagnosis not present

## 2016-02-12 DIAGNOSIS — Z23 Encounter for immunization: Secondary | ICD-10-CM | POA: Diagnosis not present

## 2016-02-12 DIAGNOSIS — N2581 Secondary hyperparathyroidism of renal origin: Secondary | ICD-10-CM | POA: Diagnosis not present

## 2016-02-12 DIAGNOSIS — Z992 Dependence on renal dialysis: Secondary | ICD-10-CM | POA: Diagnosis not present

## 2016-02-12 DIAGNOSIS — N186 End stage renal disease: Secondary | ICD-10-CM | POA: Diagnosis not present

## 2016-02-14 DIAGNOSIS — D509 Iron deficiency anemia, unspecified: Secondary | ICD-10-CM | POA: Diagnosis not present

## 2016-02-14 DIAGNOSIS — N2581 Secondary hyperparathyroidism of renal origin: Secondary | ICD-10-CM | POA: Diagnosis not present

## 2016-02-14 DIAGNOSIS — Z23 Encounter for immunization: Secondary | ICD-10-CM | POA: Diagnosis not present

## 2016-02-14 DIAGNOSIS — Z992 Dependence on renal dialysis: Secondary | ICD-10-CM | POA: Diagnosis not present

## 2016-02-14 DIAGNOSIS — N186 End stage renal disease: Secondary | ICD-10-CM | POA: Diagnosis not present

## 2016-02-17 DIAGNOSIS — Z992 Dependence on renal dialysis: Secondary | ICD-10-CM | POA: Diagnosis not present

## 2016-02-17 DIAGNOSIS — N2581 Secondary hyperparathyroidism of renal origin: Secondary | ICD-10-CM | POA: Diagnosis not present

## 2016-02-17 DIAGNOSIS — D509 Iron deficiency anemia, unspecified: Secondary | ICD-10-CM | POA: Diagnosis not present

## 2016-02-17 DIAGNOSIS — Z23 Encounter for immunization: Secondary | ICD-10-CM | POA: Diagnosis not present

## 2016-02-17 DIAGNOSIS — N186 End stage renal disease: Secondary | ICD-10-CM | POA: Diagnosis not present

## 2016-02-19 DIAGNOSIS — Z23 Encounter for immunization: Secondary | ICD-10-CM | POA: Diagnosis not present

## 2016-02-19 DIAGNOSIS — D509 Iron deficiency anemia, unspecified: Secondary | ICD-10-CM | POA: Diagnosis not present

## 2016-02-19 DIAGNOSIS — Z992 Dependence on renal dialysis: Secondary | ICD-10-CM | POA: Diagnosis not present

## 2016-02-19 DIAGNOSIS — N2581 Secondary hyperparathyroidism of renal origin: Secondary | ICD-10-CM | POA: Diagnosis not present

## 2016-02-19 DIAGNOSIS — N186 End stage renal disease: Secondary | ICD-10-CM | POA: Diagnosis not present

## 2016-02-21 DIAGNOSIS — N2581 Secondary hyperparathyroidism of renal origin: Secondary | ICD-10-CM | POA: Diagnosis not present

## 2016-02-21 DIAGNOSIS — Z23 Encounter for immunization: Secondary | ICD-10-CM | POA: Diagnosis not present

## 2016-02-21 DIAGNOSIS — Z992 Dependence on renal dialysis: Secondary | ICD-10-CM | POA: Diagnosis not present

## 2016-02-21 DIAGNOSIS — D509 Iron deficiency anemia, unspecified: Secondary | ICD-10-CM | POA: Diagnosis not present

## 2016-02-21 DIAGNOSIS — N186 End stage renal disease: Secondary | ICD-10-CM | POA: Diagnosis not present

## 2016-02-24 DIAGNOSIS — Z992 Dependence on renal dialysis: Secondary | ICD-10-CM | POA: Diagnosis not present

## 2016-02-24 DIAGNOSIS — N2581 Secondary hyperparathyroidism of renal origin: Secondary | ICD-10-CM | POA: Diagnosis not present

## 2016-02-24 DIAGNOSIS — Z23 Encounter for immunization: Secondary | ICD-10-CM | POA: Diagnosis not present

## 2016-02-24 DIAGNOSIS — N186 End stage renal disease: Secondary | ICD-10-CM | POA: Diagnosis not present

## 2016-02-24 DIAGNOSIS — D509 Iron deficiency anemia, unspecified: Secondary | ICD-10-CM | POA: Diagnosis not present

## 2016-02-26 DIAGNOSIS — D509 Iron deficiency anemia, unspecified: Secondary | ICD-10-CM | POA: Diagnosis not present

## 2016-02-26 DIAGNOSIS — Z23 Encounter for immunization: Secondary | ICD-10-CM | POA: Diagnosis not present

## 2016-02-26 DIAGNOSIS — N186 End stage renal disease: Secondary | ICD-10-CM | POA: Diagnosis not present

## 2016-02-26 DIAGNOSIS — Z992 Dependence on renal dialysis: Secondary | ICD-10-CM | POA: Diagnosis not present

## 2016-02-26 DIAGNOSIS — N2581 Secondary hyperparathyroidism of renal origin: Secondary | ICD-10-CM | POA: Diagnosis not present

## 2016-02-27 DIAGNOSIS — D509 Iron deficiency anemia, unspecified: Secondary | ICD-10-CM | POA: Diagnosis not present

## 2016-02-27 DIAGNOSIS — N2581 Secondary hyperparathyroidism of renal origin: Secondary | ICD-10-CM | POA: Diagnosis not present

## 2016-02-27 DIAGNOSIS — Z992 Dependence on renal dialysis: Secondary | ICD-10-CM | POA: Diagnosis not present

## 2016-02-27 DIAGNOSIS — N186 End stage renal disease: Secondary | ICD-10-CM | POA: Diagnosis not present

## 2016-02-28 DIAGNOSIS — D509 Iron deficiency anemia, unspecified: Secondary | ICD-10-CM | POA: Diagnosis not present

## 2016-02-28 DIAGNOSIS — Z992 Dependence on renal dialysis: Secondary | ICD-10-CM | POA: Diagnosis not present

## 2016-02-28 DIAGNOSIS — N186 End stage renal disease: Secondary | ICD-10-CM | POA: Diagnosis not present

## 2016-02-28 DIAGNOSIS — N2581 Secondary hyperparathyroidism of renal origin: Secondary | ICD-10-CM | POA: Diagnosis not present

## 2016-03-02 DIAGNOSIS — D509 Iron deficiency anemia, unspecified: Secondary | ICD-10-CM | POA: Diagnosis not present

## 2016-03-02 DIAGNOSIS — Z992 Dependence on renal dialysis: Secondary | ICD-10-CM | POA: Diagnosis not present

## 2016-03-02 DIAGNOSIS — N2581 Secondary hyperparathyroidism of renal origin: Secondary | ICD-10-CM | POA: Diagnosis not present

## 2016-03-02 DIAGNOSIS — N186 End stage renal disease: Secondary | ICD-10-CM | POA: Diagnosis not present

## 2016-03-04 DIAGNOSIS — Z992 Dependence on renal dialysis: Secondary | ICD-10-CM | POA: Diagnosis not present

## 2016-03-04 DIAGNOSIS — D509 Iron deficiency anemia, unspecified: Secondary | ICD-10-CM | POA: Diagnosis not present

## 2016-03-04 DIAGNOSIS — N186 End stage renal disease: Secondary | ICD-10-CM | POA: Diagnosis not present

## 2016-03-04 DIAGNOSIS — N2581 Secondary hyperparathyroidism of renal origin: Secondary | ICD-10-CM | POA: Diagnosis not present

## 2016-03-06 DIAGNOSIS — Z992 Dependence on renal dialysis: Secondary | ICD-10-CM | POA: Diagnosis not present

## 2016-03-06 DIAGNOSIS — D509 Iron deficiency anemia, unspecified: Secondary | ICD-10-CM | POA: Diagnosis not present

## 2016-03-06 DIAGNOSIS — N2581 Secondary hyperparathyroidism of renal origin: Secondary | ICD-10-CM | POA: Diagnosis not present

## 2016-03-06 DIAGNOSIS — N186 End stage renal disease: Secondary | ICD-10-CM | POA: Diagnosis not present

## 2016-03-09 DIAGNOSIS — N186 End stage renal disease: Secondary | ICD-10-CM | POA: Diagnosis not present

## 2016-03-09 DIAGNOSIS — Z992 Dependence on renal dialysis: Secondary | ICD-10-CM | POA: Diagnosis not present

## 2016-03-09 DIAGNOSIS — D509 Iron deficiency anemia, unspecified: Secondary | ICD-10-CM | POA: Diagnosis not present

## 2016-03-09 DIAGNOSIS — N2581 Secondary hyperparathyroidism of renal origin: Secondary | ICD-10-CM | POA: Diagnosis not present

## 2016-03-10 DIAGNOSIS — N2581 Secondary hyperparathyroidism of renal origin: Secondary | ICD-10-CM | POA: Diagnosis not present

## 2016-03-10 DIAGNOSIS — Z992 Dependence on renal dialysis: Secondary | ICD-10-CM | POA: Diagnosis not present

## 2016-03-10 DIAGNOSIS — D509 Iron deficiency anemia, unspecified: Secondary | ICD-10-CM | POA: Diagnosis not present

## 2016-03-10 DIAGNOSIS — N186 End stage renal disease: Secondary | ICD-10-CM | POA: Diagnosis not present

## 2016-03-11 DIAGNOSIS — D509 Iron deficiency anemia, unspecified: Secondary | ICD-10-CM | POA: Diagnosis not present

## 2016-03-11 DIAGNOSIS — N2581 Secondary hyperparathyroidism of renal origin: Secondary | ICD-10-CM | POA: Diagnosis not present

## 2016-03-11 DIAGNOSIS — Z992 Dependence on renal dialysis: Secondary | ICD-10-CM | POA: Diagnosis not present

## 2016-03-11 DIAGNOSIS — N186 End stage renal disease: Secondary | ICD-10-CM | POA: Diagnosis not present

## 2016-03-13 DIAGNOSIS — D509 Iron deficiency anemia, unspecified: Secondary | ICD-10-CM | POA: Diagnosis not present

## 2016-03-13 DIAGNOSIS — Z992 Dependence on renal dialysis: Secondary | ICD-10-CM | POA: Diagnosis not present

## 2016-03-13 DIAGNOSIS — N186 End stage renal disease: Secondary | ICD-10-CM | POA: Diagnosis not present

## 2016-03-13 DIAGNOSIS — N2581 Secondary hyperparathyroidism of renal origin: Secondary | ICD-10-CM | POA: Diagnosis not present

## 2016-03-16 DIAGNOSIS — N186 End stage renal disease: Secondary | ICD-10-CM | POA: Diagnosis not present

## 2016-03-16 DIAGNOSIS — Z992 Dependence on renal dialysis: Secondary | ICD-10-CM | POA: Diagnosis not present

## 2016-03-16 DIAGNOSIS — N2581 Secondary hyperparathyroidism of renal origin: Secondary | ICD-10-CM | POA: Diagnosis not present

## 2016-03-16 DIAGNOSIS — D509 Iron deficiency anemia, unspecified: Secondary | ICD-10-CM | POA: Diagnosis not present

## 2016-03-18 DIAGNOSIS — Z992 Dependence on renal dialysis: Secondary | ICD-10-CM | POA: Diagnosis not present

## 2016-03-18 DIAGNOSIS — N186 End stage renal disease: Secondary | ICD-10-CM | POA: Diagnosis not present

## 2016-03-18 DIAGNOSIS — D509 Iron deficiency anemia, unspecified: Secondary | ICD-10-CM | POA: Diagnosis not present

## 2016-03-18 DIAGNOSIS — N2581 Secondary hyperparathyroidism of renal origin: Secondary | ICD-10-CM | POA: Diagnosis not present

## 2016-03-20 DIAGNOSIS — D509 Iron deficiency anemia, unspecified: Secondary | ICD-10-CM | POA: Diagnosis not present

## 2016-03-20 DIAGNOSIS — N2581 Secondary hyperparathyroidism of renal origin: Secondary | ICD-10-CM | POA: Diagnosis not present

## 2016-03-20 DIAGNOSIS — N186 End stage renal disease: Secondary | ICD-10-CM | POA: Diagnosis not present

## 2016-03-20 DIAGNOSIS — Z992 Dependence on renal dialysis: Secondary | ICD-10-CM | POA: Diagnosis not present

## 2016-03-23 DIAGNOSIS — N2581 Secondary hyperparathyroidism of renal origin: Secondary | ICD-10-CM | POA: Diagnosis not present

## 2016-03-23 DIAGNOSIS — D509 Iron deficiency anemia, unspecified: Secondary | ICD-10-CM | POA: Diagnosis not present

## 2016-03-23 DIAGNOSIS — N186 End stage renal disease: Secondary | ICD-10-CM | POA: Diagnosis not present

## 2016-03-23 DIAGNOSIS — Z992 Dependence on renal dialysis: Secondary | ICD-10-CM | POA: Diagnosis not present

## 2016-03-25 DIAGNOSIS — D509 Iron deficiency anemia, unspecified: Secondary | ICD-10-CM | POA: Diagnosis not present

## 2016-03-25 DIAGNOSIS — N186 End stage renal disease: Secondary | ICD-10-CM | POA: Diagnosis not present

## 2016-03-25 DIAGNOSIS — Z992 Dependence on renal dialysis: Secondary | ICD-10-CM | POA: Diagnosis not present

## 2016-03-25 DIAGNOSIS — N2581 Secondary hyperparathyroidism of renal origin: Secondary | ICD-10-CM | POA: Diagnosis not present

## 2016-03-26 DIAGNOSIS — Z23 Encounter for immunization: Secondary | ICD-10-CM | POA: Diagnosis not present

## 2016-03-26 DIAGNOSIS — N186 End stage renal disease: Secondary | ICD-10-CM | POA: Diagnosis not present

## 2016-03-26 DIAGNOSIS — D509 Iron deficiency anemia, unspecified: Secondary | ICD-10-CM | POA: Diagnosis not present

## 2016-03-26 DIAGNOSIS — Z992 Dependence on renal dialysis: Secondary | ICD-10-CM | POA: Diagnosis not present

## 2016-03-26 DIAGNOSIS — N2581 Secondary hyperparathyroidism of renal origin: Secondary | ICD-10-CM | POA: Diagnosis not present

## 2016-03-27 DIAGNOSIS — Z992 Dependence on renal dialysis: Secondary | ICD-10-CM | POA: Diagnosis not present

## 2016-03-27 DIAGNOSIS — N2581 Secondary hyperparathyroidism of renal origin: Secondary | ICD-10-CM | POA: Diagnosis not present

## 2016-03-27 DIAGNOSIS — D509 Iron deficiency anemia, unspecified: Secondary | ICD-10-CM | POA: Diagnosis not present

## 2016-03-27 DIAGNOSIS — Z23 Encounter for immunization: Secondary | ICD-10-CM | POA: Diagnosis not present

## 2016-03-27 DIAGNOSIS — N186 End stage renal disease: Secondary | ICD-10-CM | POA: Diagnosis not present

## 2016-03-30 DIAGNOSIS — N186 End stage renal disease: Secondary | ICD-10-CM | POA: Diagnosis not present

## 2016-03-30 DIAGNOSIS — D509 Iron deficiency anemia, unspecified: Secondary | ICD-10-CM | POA: Diagnosis not present

## 2016-03-30 DIAGNOSIS — Z992 Dependence on renal dialysis: Secondary | ICD-10-CM | POA: Diagnosis not present

## 2016-03-30 DIAGNOSIS — N2581 Secondary hyperparathyroidism of renal origin: Secondary | ICD-10-CM | POA: Diagnosis not present

## 2016-03-30 DIAGNOSIS — Z23 Encounter for immunization: Secondary | ICD-10-CM | POA: Diagnosis not present

## 2016-04-01 DIAGNOSIS — Z23 Encounter for immunization: Secondary | ICD-10-CM | POA: Diagnosis not present

## 2016-04-01 DIAGNOSIS — N2581 Secondary hyperparathyroidism of renal origin: Secondary | ICD-10-CM | POA: Diagnosis not present

## 2016-04-01 DIAGNOSIS — Z992 Dependence on renal dialysis: Secondary | ICD-10-CM | POA: Diagnosis not present

## 2016-04-01 DIAGNOSIS — N186 End stage renal disease: Secondary | ICD-10-CM | POA: Diagnosis not present

## 2016-04-01 DIAGNOSIS — D509 Iron deficiency anemia, unspecified: Secondary | ICD-10-CM | POA: Diagnosis not present

## 2016-04-03 DIAGNOSIS — N186 End stage renal disease: Secondary | ICD-10-CM | POA: Diagnosis not present

## 2016-04-03 DIAGNOSIS — Z23 Encounter for immunization: Secondary | ICD-10-CM | POA: Diagnosis not present

## 2016-04-03 DIAGNOSIS — Z992 Dependence on renal dialysis: Secondary | ICD-10-CM | POA: Diagnosis not present

## 2016-04-03 DIAGNOSIS — N2581 Secondary hyperparathyroidism of renal origin: Secondary | ICD-10-CM | POA: Diagnosis not present

## 2016-04-03 DIAGNOSIS — D509 Iron deficiency anemia, unspecified: Secondary | ICD-10-CM | POA: Diagnosis not present

## 2016-04-06 DIAGNOSIS — N186 End stage renal disease: Secondary | ICD-10-CM | POA: Diagnosis not present

## 2016-04-06 DIAGNOSIS — Z23 Encounter for immunization: Secondary | ICD-10-CM | POA: Diagnosis not present

## 2016-04-06 DIAGNOSIS — D509 Iron deficiency anemia, unspecified: Secondary | ICD-10-CM | POA: Diagnosis not present

## 2016-04-06 DIAGNOSIS — Z992 Dependence on renal dialysis: Secondary | ICD-10-CM | POA: Diagnosis not present

## 2016-04-06 DIAGNOSIS — N2581 Secondary hyperparathyroidism of renal origin: Secondary | ICD-10-CM | POA: Diagnosis not present

## 2016-04-07 DIAGNOSIS — Z23 Encounter for immunization: Secondary | ICD-10-CM | POA: Diagnosis not present

## 2016-04-07 DIAGNOSIS — D509 Iron deficiency anemia, unspecified: Secondary | ICD-10-CM | POA: Diagnosis not present

## 2016-04-07 DIAGNOSIS — N186 End stage renal disease: Secondary | ICD-10-CM | POA: Diagnosis not present

## 2016-04-07 DIAGNOSIS — Z992 Dependence on renal dialysis: Secondary | ICD-10-CM | POA: Diagnosis not present

## 2016-04-07 DIAGNOSIS — N2581 Secondary hyperparathyroidism of renal origin: Secondary | ICD-10-CM | POA: Diagnosis not present

## 2016-04-08 DIAGNOSIS — Z992 Dependence on renal dialysis: Secondary | ICD-10-CM | POA: Diagnosis not present

## 2016-04-08 DIAGNOSIS — N2581 Secondary hyperparathyroidism of renal origin: Secondary | ICD-10-CM | POA: Diagnosis not present

## 2016-04-08 DIAGNOSIS — N186 End stage renal disease: Secondary | ICD-10-CM | POA: Diagnosis not present

## 2016-04-08 DIAGNOSIS — Z23 Encounter for immunization: Secondary | ICD-10-CM | POA: Diagnosis not present

## 2016-04-08 DIAGNOSIS — D509 Iron deficiency anemia, unspecified: Secondary | ICD-10-CM | POA: Diagnosis not present

## 2016-04-10 DIAGNOSIS — Z992 Dependence on renal dialysis: Secondary | ICD-10-CM | POA: Diagnosis not present

## 2016-04-10 DIAGNOSIS — N2581 Secondary hyperparathyroidism of renal origin: Secondary | ICD-10-CM | POA: Diagnosis not present

## 2016-04-10 DIAGNOSIS — Z23 Encounter for immunization: Secondary | ICD-10-CM | POA: Diagnosis not present

## 2016-04-10 DIAGNOSIS — N186 End stage renal disease: Secondary | ICD-10-CM | POA: Diagnosis not present

## 2016-04-10 DIAGNOSIS — D509 Iron deficiency anemia, unspecified: Secondary | ICD-10-CM | POA: Diagnosis not present

## 2016-04-13 DIAGNOSIS — Z23 Encounter for immunization: Secondary | ICD-10-CM | POA: Diagnosis not present

## 2016-04-13 DIAGNOSIS — Z992 Dependence on renal dialysis: Secondary | ICD-10-CM | POA: Diagnosis not present

## 2016-04-13 DIAGNOSIS — D509 Iron deficiency anemia, unspecified: Secondary | ICD-10-CM | POA: Diagnosis not present

## 2016-04-13 DIAGNOSIS — N2581 Secondary hyperparathyroidism of renal origin: Secondary | ICD-10-CM | POA: Diagnosis not present

## 2016-04-13 DIAGNOSIS — N186 End stage renal disease: Secondary | ICD-10-CM | POA: Diagnosis not present

## 2016-04-15 DIAGNOSIS — N186 End stage renal disease: Secondary | ICD-10-CM | POA: Diagnosis not present

## 2016-04-15 DIAGNOSIS — D509 Iron deficiency anemia, unspecified: Secondary | ICD-10-CM | POA: Diagnosis not present

## 2016-04-15 DIAGNOSIS — Z23 Encounter for immunization: Secondary | ICD-10-CM | POA: Diagnosis not present

## 2016-04-15 DIAGNOSIS — Z992 Dependence on renal dialysis: Secondary | ICD-10-CM | POA: Diagnosis not present

## 2016-04-15 DIAGNOSIS — N2581 Secondary hyperparathyroidism of renal origin: Secondary | ICD-10-CM | POA: Diagnosis not present

## 2016-04-17 DIAGNOSIS — N186 End stage renal disease: Secondary | ICD-10-CM | POA: Diagnosis not present

## 2016-04-17 DIAGNOSIS — D509 Iron deficiency anemia, unspecified: Secondary | ICD-10-CM | POA: Diagnosis not present

## 2016-04-17 DIAGNOSIS — N2581 Secondary hyperparathyroidism of renal origin: Secondary | ICD-10-CM | POA: Diagnosis not present

## 2016-04-17 DIAGNOSIS — Z23 Encounter for immunization: Secondary | ICD-10-CM | POA: Diagnosis not present

## 2016-04-17 DIAGNOSIS — Z992 Dependence on renal dialysis: Secondary | ICD-10-CM | POA: Diagnosis not present

## 2016-04-20 DIAGNOSIS — N2581 Secondary hyperparathyroidism of renal origin: Secondary | ICD-10-CM | POA: Diagnosis not present

## 2016-04-20 DIAGNOSIS — N186 End stage renal disease: Secondary | ICD-10-CM | POA: Diagnosis not present

## 2016-04-20 DIAGNOSIS — D509 Iron deficiency anemia, unspecified: Secondary | ICD-10-CM | POA: Diagnosis not present

## 2016-04-20 DIAGNOSIS — Z992 Dependence on renal dialysis: Secondary | ICD-10-CM | POA: Diagnosis not present

## 2016-04-20 DIAGNOSIS — Z23 Encounter for immunization: Secondary | ICD-10-CM | POA: Diagnosis not present

## 2016-04-22 DIAGNOSIS — N186 End stage renal disease: Secondary | ICD-10-CM | POA: Diagnosis not present

## 2016-04-22 DIAGNOSIS — D509 Iron deficiency anemia, unspecified: Secondary | ICD-10-CM | POA: Diagnosis not present

## 2016-04-22 DIAGNOSIS — Z23 Encounter for immunization: Secondary | ICD-10-CM | POA: Diagnosis not present

## 2016-04-22 DIAGNOSIS — Z992 Dependence on renal dialysis: Secondary | ICD-10-CM | POA: Diagnosis not present

## 2016-04-22 DIAGNOSIS — N2581 Secondary hyperparathyroidism of renal origin: Secondary | ICD-10-CM | POA: Diagnosis not present

## 2016-04-24 DIAGNOSIS — D509 Iron deficiency anemia, unspecified: Secondary | ICD-10-CM | POA: Diagnosis not present

## 2016-04-24 DIAGNOSIS — Z992 Dependence on renal dialysis: Secondary | ICD-10-CM | POA: Diagnosis not present

## 2016-04-24 DIAGNOSIS — N186 End stage renal disease: Secondary | ICD-10-CM | POA: Diagnosis not present

## 2016-04-24 DIAGNOSIS — Z23 Encounter for immunization: Secondary | ICD-10-CM | POA: Diagnosis not present

## 2016-04-24 DIAGNOSIS — N2581 Secondary hyperparathyroidism of renal origin: Secondary | ICD-10-CM | POA: Diagnosis not present

## 2016-04-25 DIAGNOSIS — Z992 Dependence on renal dialysis: Secondary | ICD-10-CM | POA: Diagnosis not present

## 2016-04-25 DIAGNOSIS — N186 End stage renal disease: Secondary | ICD-10-CM | POA: Diagnosis not present

## 2016-04-26 DIAGNOSIS — N2581 Secondary hyperparathyroidism of renal origin: Secondary | ICD-10-CM | POA: Diagnosis not present

## 2016-04-26 DIAGNOSIS — D509 Iron deficiency anemia, unspecified: Secondary | ICD-10-CM | POA: Diagnosis not present

## 2016-04-26 DIAGNOSIS — Z992 Dependence on renal dialysis: Secondary | ICD-10-CM | POA: Diagnosis not present

## 2016-04-26 DIAGNOSIS — N186 End stage renal disease: Secondary | ICD-10-CM | POA: Diagnosis not present

## 2016-04-27 DIAGNOSIS — Z992 Dependence on renal dialysis: Secondary | ICD-10-CM | POA: Diagnosis not present

## 2016-04-27 DIAGNOSIS — N186 End stage renal disease: Secondary | ICD-10-CM | POA: Diagnosis not present

## 2016-04-27 DIAGNOSIS — N2581 Secondary hyperparathyroidism of renal origin: Secondary | ICD-10-CM | POA: Diagnosis not present

## 2016-04-27 DIAGNOSIS — D509 Iron deficiency anemia, unspecified: Secondary | ICD-10-CM | POA: Diagnosis not present

## 2016-04-29 DIAGNOSIS — Z992 Dependence on renal dialysis: Secondary | ICD-10-CM | POA: Diagnosis not present

## 2016-04-29 DIAGNOSIS — D509 Iron deficiency anemia, unspecified: Secondary | ICD-10-CM | POA: Diagnosis not present

## 2016-04-29 DIAGNOSIS — N186 End stage renal disease: Secondary | ICD-10-CM | POA: Diagnosis not present

## 2016-04-29 DIAGNOSIS — N2581 Secondary hyperparathyroidism of renal origin: Secondary | ICD-10-CM | POA: Diagnosis not present

## 2016-05-01 DIAGNOSIS — D509 Iron deficiency anemia, unspecified: Secondary | ICD-10-CM | POA: Diagnosis not present

## 2016-05-01 DIAGNOSIS — N2581 Secondary hyperparathyroidism of renal origin: Secondary | ICD-10-CM | POA: Diagnosis not present

## 2016-05-01 DIAGNOSIS — N186 End stage renal disease: Secondary | ICD-10-CM | POA: Diagnosis not present

## 2016-05-01 DIAGNOSIS — Z992 Dependence on renal dialysis: Secondary | ICD-10-CM | POA: Diagnosis not present

## 2016-05-04 DIAGNOSIS — Z992 Dependence on renal dialysis: Secondary | ICD-10-CM | POA: Diagnosis not present

## 2016-05-04 DIAGNOSIS — N2581 Secondary hyperparathyroidism of renal origin: Secondary | ICD-10-CM | POA: Diagnosis not present

## 2016-05-04 DIAGNOSIS — D509 Iron deficiency anemia, unspecified: Secondary | ICD-10-CM | POA: Diagnosis not present

## 2016-05-04 DIAGNOSIS — N186 End stage renal disease: Secondary | ICD-10-CM | POA: Diagnosis not present

## 2016-05-06 DIAGNOSIS — Z992 Dependence on renal dialysis: Secondary | ICD-10-CM | POA: Diagnosis not present

## 2016-05-06 DIAGNOSIS — N186 End stage renal disease: Secondary | ICD-10-CM | POA: Diagnosis not present

## 2016-05-06 DIAGNOSIS — D509 Iron deficiency anemia, unspecified: Secondary | ICD-10-CM | POA: Diagnosis not present

## 2016-05-06 DIAGNOSIS — N2581 Secondary hyperparathyroidism of renal origin: Secondary | ICD-10-CM | POA: Diagnosis not present

## 2016-05-07 DIAGNOSIS — D509 Iron deficiency anemia, unspecified: Secondary | ICD-10-CM | POA: Diagnosis not present

## 2016-05-07 DIAGNOSIS — Z992 Dependence on renal dialysis: Secondary | ICD-10-CM | POA: Diagnosis not present

## 2016-05-07 DIAGNOSIS — N2581 Secondary hyperparathyroidism of renal origin: Secondary | ICD-10-CM | POA: Diagnosis not present

## 2016-05-07 DIAGNOSIS — N186 End stage renal disease: Secondary | ICD-10-CM | POA: Diagnosis not present

## 2016-05-08 DIAGNOSIS — D509 Iron deficiency anemia, unspecified: Secondary | ICD-10-CM | POA: Diagnosis not present

## 2016-05-08 DIAGNOSIS — Z992 Dependence on renal dialysis: Secondary | ICD-10-CM | POA: Diagnosis not present

## 2016-05-08 DIAGNOSIS — N2581 Secondary hyperparathyroidism of renal origin: Secondary | ICD-10-CM | POA: Diagnosis not present

## 2016-05-08 DIAGNOSIS — N186 End stage renal disease: Secondary | ICD-10-CM | POA: Diagnosis not present

## 2016-05-09 ENCOUNTER — Emergency Department (HOSPITAL_COMMUNITY)
Admission: EM | Admit: 2016-05-09 | Discharge: 2016-05-09 | Disposition: A | Discharge status: Home / Self Care | Payer: Medicare Other | Attending: Emergency Medicine | Admitting: Emergency Medicine

## 2016-05-09 ENCOUNTER — Encounter (HOSPITAL_COMMUNITY): Payer: Self-pay | Admitting: *Deleted

## 2016-05-09 DIAGNOSIS — Z7982 Long term (current) use of aspirin: Secondary | ICD-10-CM | POA: Diagnosis not present

## 2016-05-09 DIAGNOSIS — Y939 Activity, unspecified: Secondary | ICD-10-CM | POA: Diagnosis not present

## 2016-05-09 DIAGNOSIS — T162XXA Foreign body in left ear, initial encounter: Secondary | ICD-10-CM | POA: Diagnosis not present

## 2016-05-09 DIAGNOSIS — H9202 Otalgia, left ear: Secondary | ICD-10-CM | POA: Diagnosis present

## 2016-05-09 DIAGNOSIS — Z992 Dependence on renal dialysis: Secondary | ICD-10-CM | POA: Insufficient documentation

## 2016-05-09 DIAGNOSIS — J449 Chronic obstructive pulmonary disease, unspecified: Secondary | ICD-10-CM | POA: Insufficient documentation

## 2016-05-09 DIAGNOSIS — I132 Hypertensive heart and chronic kidney disease with heart failure and with stage 5 chronic kidney disease, or end stage renal disease: Secondary | ICD-10-CM | POA: Diagnosis not present

## 2016-05-09 DIAGNOSIS — I2510 Atherosclerotic heart disease of native coronary artery without angina pectoris: Secondary | ICD-10-CM | POA: Insufficient documentation

## 2016-05-09 DIAGNOSIS — I509 Heart failure, unspecified: Secondary | ICD-10-CM | POA: Diagnosis not present

## 2016-05-09 DIAGNOSIS — Y999 Unspecified external cause status: Secondary | ICD-10-CM | POA: Diagnosis not present

## 2016-05-09 DIAGNOSIS — Z79899 Other long term (current) drug therapy: Secondary | ICD-10-CM | POA: Diagnosis not present

## 2016-05-09 DIAGNOSIS — X58XXXA Exposure to other specified factors, initial encounter: Secondary | ICD-10-CM | POA: Diagnosis not present

## 2016-05-09 DIAGNOSIS — N2581 Secondary hyperparathyroidism of renal origin: Secondary | ICD-10-CM | POA: Diagnosis not present

## 2016-05-09 DIAGNOSIS — Y929 Unspecified place or not applicable: Secondary | ICD-10-CM | POA: Insufficient documentation

## 2016-05-09 DIAGNOSIS — D509 Iron deficiency anemia, unspecified: Secondary | ICD-10-CM | POA: Diagnosis not present

## 2016-05-09 DIAGNOSIS — N186 End stage renal disease: Secondary | ICD-10-CM | POA: Insufficient documentation

## 2016-05-09 MED ORDER — LIDOCAINE VISCOUS 2 % MT SOLN
15.0000 mL | Freq: Once | OROMUCOSAL | Status: AC
  Administered 2016-05-09: 15 mL via OROMUCOSAL
  Filled 2016-05-09: qty 15

## 2016-05-09 NOTE — ED Triage Notes (Addendum)
Pt c/o bug in his left ear crawling around. Pt is on dialysis, last dialyzed yesterday. BP elevated in triage, BP meds taken today as prescribed.

## 2016-05-09 NOTE — ED Provider Notes (Signed)
[facility] DEPT Provider Note   CSN: [id number] Arrival date & time: 05/09/16  1748     History   Chief Complaint Chief Complaint  Patient presents with  . Otalgia    HPI Mario Alexander is a 46 y.o. male.  The history is provided by the patient.  Otalgia  This is a new problem. The current episode started less than 1 hour ago. There is pain in the left ear. The problem occurs constantly. The problem has not changed since onset.There has been no fever. The pain is moderate. Pertinent negatives include no ear discharge, no headaches, no hearing loss and no vomiting.   46 year old male who presents with left ear pain that occurred just prior to arrival. States that he was lying in bed, when he started feeling like something was crawling in his left ear. History of end-stage renal disease, last dialyzed yesterday. States he otherwise has been in his usual state of health. Past Medical History:  Diagnosis Date  . CHF (congestive heart failure) (HCC)   . COPD (chronic obstructive pulmonary disease) (HCC)   . Coronary atherosclerosis of native coronary artery    Multivessel status post CABG at [facility] 07/2011, LVEF 70%  . ESRD on hemodialysis (HCC)   . Essential hypertension, benign   . History of medication noncompliance   . History of stroke 2012   No ASD, PFO, SOE by TEE 01/2010  . Hypertensive heart disease   . Mixed hyperlipidemia   . Peripheral vascular disease [facility])     Patient Active Problem List   Diagnosis Date Noted  . Mixed hyperlipidemia 08/03/2013  . Precordial pain 08/03/2013  . Coronary atherosclerosis of native coronary artery 08/03/2013  . ESRD on hemodialysis (HCC) 12/23/2010  . Essential hypertension, benign 12/23/2010    Past Surgical History:  Procedure Laterality Date  . CARDIAC CATHETERIZATION    . COLONOSCOPY    . CORONARY ARTERY BYPASS GRAFT  07/2011   [facility] - LIMA to LAD, SVG to circumflex, SVG to ramus intermedius  . INSERTION OF DIALYSIS CATHETER   01/02/2011   Procedure: INSERTION OF DIALYSIS CATHETER;  Surgeon: Pryor Ochoa, MD;  Location: [facility] OR;  Service: Vascular;  Laterality: N/A;  Insertion of Dialysis catheter Right Internal Jugular with 24cm dialysis catheter  . INSERTION OF DIALYSIS CATHETER Right 11/08/2014   Procedure: INSERTION OF DIALYSIS CATHETER RIGHT INTERNAL JUGULAR;  Surgeon: Chuck Hint, MD;  Location: [facility] OR;  Service: Vascular;  Laterality: Right;  . PERIPHERAL VASCULAR CATHETERIZATION N/A 03/14/2015   Procedure: A/V Shuntogram/Fistulagram;  Surgeon: Fransisco Hertz, MD;  Location: [facility] INVASIVE CV LAB;  Service: Cardiovascular;  Laterality: N/A;  . REVISON OF ARTERIOVENOUS FISTULA Left 11/08/2014   Procedure: REVISION PLICATION OF LEFT ARM ARTERIOVENOUS FISTULA;  Surgeon: Chuck Hint, MD;  Location: [facility] OR;  Service: Vascular;  Laterality: Left;  . SHUNTOGRAM N/A 02/01/2014   Procedure: FISTULOGRAM;  Surgeon: Fransisco Hertz, MD;  Location: [facility] CATH LAB;  Service: Cardiovascular;  Laterality: N/A;       Home Medications    Prior to Admission medications   Medication Sig Start Date End Date Taking? Authorizing Provider  aspirin 81 MG tablet Take 81 mg by mouth daily.    Historical Provider, MD  cloNIDine (CATAPRES) 0.2 MG tablet Take 0.2 mg by mouth 2 (two) times daily.    Historical Provider, MD  hydrALAZINE (APRESOLINE) 50 MG tablet Take 50 mg by mouth 2 (two) times daily.    Historical Provider, MD  labetalol (NORMODYNE) 200 MG tablet Take 200 mg by mouth 2 (two) times daily.  10/23/14   Historical Provider, MD  multivitamin (RENA-VIT) TABS tablet Take 1 tablet by mouth 3 (three) times daily.     Historical Provider, MD  oxyCODONE-acetaminophen (ROXICET) 5-325 MG tablet Take 1-2 tablets by mouth every 4 (four) hours as needed for severe pain. 11/08/14   Chuck Hint, MD  SENSIPAR 30 MG tablet Take 1 tablet by mouth daily. 03/05/15   Historical Provider, MD  sevelamer carbonate (RENVELA) 800 MG  tablet Take 800 mg by mouth 3 (three) times daily with meals.    Historical Provider, MD    Family History Family History  Problem Relation Age of Onset  . Diabetes Mother   . Hypertension Mother   . Hyperlipidemia Mother   . Heart disease Mother     before age 52  . COPD Father   . Lung cancer Father   . Cancer Father     Social History Social History  Substance Use Topics  . Smoking status: Never Smoker  . Smokeless tobacco: Never Used  . Alcohol use No     Allergies   Patient has no known allergies.   Review of Systems Review of Systems  Constitutional: Negative for fever.  HENT: Positive for ear pain. Negative for congestion, ear discharge and hearing loss.   Respiratory: Negative for shortness of breath.   Cardiovascular: Negative for chest pain.  Gastrointestinal: Negative for nausea and vomiting.  Neurological: Negative for headaches.  All other systems reviewed and are negative.    Physical Exam Updated Vital Signs BP (!) 163/131   Pulse 84   Temp 98.4 F (36.9 C) (Oral)   Resp 18   Ht  (1.676 m)   Wt 180 lb (81.6 kg)   SpO2 99%   BMI 29.05 kg/m   Physical Exam Physical Exam  Constitutional: He appears well-developed and well-nourished.  HENT:  Head: Normocephalic.  Eyes: Conjunctivae are normal.  Ears: Small ant in the left ear, intact bilateral TMs Cardiovascular: Normal rate and intact distal pulses.   Pulmonary/Chest: Effort normal. No respiratory distress.  Abdominal: He exhibits no distension.  Musculoskeletal: Normal range of motion. He exhibits no deformity.  Neurological: He is alert.  Skin: Skin is warm and dry.  Psychiatric: He has a normal mood and affect. His behavior is normal.  Nursing note and vitals reviewed.   ED Treatments / Results  Labs (all labs ordered are listed, but only abnormal results are displayed) Labs Reviewed - No data to display  EKG  EKG Interpretation None       Radiology No results  found.  Procedures Procedures (including critical care time)  Medications Ordered in ED Medications  lidocaine (XYLOCAINE) 2 % viscous mouth solution 15 mL (15 mLs Mouth/Throat Given 05/09/16 1917)     Initial Impression / Assessment and Plan / ED Course  I have reviewed the triage vital signs and the nursing notes.  Pertinent labs & imaging results that were available during my care of the patient were reviewed by me and considered in my medical decision making (see chart for details).     Small ant in the left ear, noted on exam. This is likely causing him symptoms. Viscous lidocaine applied, and removed with irrigation. TM intact on reexamination.  Final Clinical Impressions(s) / ED Diagnoses   Final diagnoses:  Foreign body of left ear, initial encounter    New Prescriptions New Prescriptions  No medications on file     Lavera Guise, MD 05/09/16 2024

## 2016-05-09 NOTE — Discharge Instructions (Signed)
You had a bug in your left ear that was removed.  Please return for worsening symptoms, including hearing loss, worsening ear pain, fever, or any other symptoms concerning to you.

## 2016-05-11 DIAGNOSIS — N2581 Secondary hyperparathyroidism of renal origin: Secondary | ICD-10-CM | POA: Diagnosis not present

## 2016-05-11 DIAGNOSIS — Z992 Dependence on renal dialysis: Secondary | ICD-10-CM | POA: Diagnosis not present

## 2016-05-11 DIAGNOSIS — N186 End stage renal disease: Secondary | ICD-10-CM | POA: Diagnosis not present

## 2016-05-11 DIAGNOSIS — D509 Iron deficiency anemia, unspecified: Secondary | ICD-10-CM | POA: Diagnosis not present

## 2016-05-12 DIAGNOSIS — Z992 Dependence on renal dialysis: Secondary | ICD-10-CM | POA: Diagnosis not present

## 2016-05-12 DIAGNOSIS — D509 Iron deficiency anemia, unspecified: Secondary | ICD-10-CM | POA: Diagnosis not present

## 2016-05-12 DIAGNOSIS — N2581 Secondary hyperparathyroidism of renal origin: Secondary | ICD-10-CM | POA: Diagnosis not present

## 2016-05-12 DIAGNOSIS — N186 End stage renal disease: Secondary | ICD-10-CM | POA: Diagnosis not present

## 2016-05-13 DIAGNOSIS — Z992 Dependence on renal dialysis: Secondary | ICD-10-CM | POA: Diagnosis not present

## 2016-05-13 DIAGNOSIS — D509 Iron deficiency anemia, unspecified: Secondary | ICD-10-CM | POA: Diagnosis not present

## 2016-05-13 DIAGNOSIS — N2581 Secondary hyperparathyroidism of renal origin: Secondary | ICD-10-CM | POA: Diagnosis not present

## 2016-05-13 DIAGNOSIS — N186 End stage renal disease: Secondary | ICD-10-CM | POA: Diagnosis not present

## 2016-05-15 DIAGNOSIS — Z992 Dependence on renal dialysis: Secondary | ICD-10-CM | POA: Diagnosis not present

## 2016-05-15 DIAGNOSIS — D509 Iron deficiency anemia, unspecified: Secondary | ICD-10-CM | POA: Diagnosis not present

## 2016-05-15 DIAGNOSIS — N2581 Secondary hyperparathyroidism of renal origin: Secondary | ICD-10-CM | POA: Diagnosis not present

## 2016-05-15 DIAGNOSIS — N186 End stage renal disease: Secondary | ICD-10-CM | POA: Diagnosis not present

## 2016-05-18 DIAGNOSIS — Z992 Dependence on renal dialysis: Secondary | ICD-10-CM | POA: Diagnosis not present

## 2016-05-18 DIAGNOSIS — D509 Iron deficiency anemia, unspecified: Secondary | ICD-10-CM | POA: Diagnosis not present

## 2016-05-18 DIAGNOSIS — N186 End stage renal disease: Secondary | ICD-10-CM | POA: Diagnosis not present

## 2016-05-18 DIAGNOSIS — N2581 Secondary hyperparathyroidism of renal origin: Secondary | ICD-10-CM | POA: Diagnosis not present

## 2016-05-20 DIAGNOSIS — N186 End stage renal disease: Secondary | ICD-10-CM | POA: Diagnosis not present

## 2016-05-20 DIAGNOSIS — N2581 Secondary hyperparathyroidism of renal origin: Secondary | ICD-10-CM | POA: Diagnosis not present

## 2016-05-20 DIAGNOSIS — D509 Iron deficiency anemia, unspecified: Secondary | ICD-10-CM | POA: Diagnosis not present

## 2016-05-20 DIAGNOSIS — Z992 Dependence on renal dialysis: Secondary | ICD-10-CM | POA: Diagnosis not present

## 2016-05-22 DIAGNOSIS — N2581 Secondary hyperparathyroidism of renal origin: Secondary | ICD-10-CM | POA: Diagnosis not present

## 2016-05-22 DIAGNOSIS — N186 End stage renal disease: Secondary | ICD-10-CM | POA: Diagnosis not present

## 2016-05-22 DIAGNOSIS — D509 Iron deficiency anemia, unspecified: Secondary | ICD-10-CM | POA: Diagnosis not present

## 2016-05-22 DIAGNOSIS — Z992 Dependence on renal dialysis: Secondary | ICD-10-CM | POA: Diagnosis not present

## 2016-05-25 DIAGNOSIS — N2581 Secondary hyperparathyroidism of renal origin: Secondary | ICD-10-CM | POA: Diagnosis not present

## 2016-05-25 DIAGNOSIS — D509 Iron deficiency anemia, unspecified: Secondary | ICD-10-CM | POA: Diagnosis not present

## 2016-05-25 DIAGNOSIS — Z992 Dependence on renal dialysis: Secondary | ICD-10-CM | POA: Diagnosis not present

## 2016-05-25 DIAGNOSIS — N186 End stage renal disease: Secondary | ICD-10-CM | POA: Diagnosis not present

## 2016-05-26 DIAGNOSIS — E611 Iron deficiency: Secondary | ICD-10-CM | POA: Diagnosis not present

## 2016-05-26 DIAGNOSIS — Z992 Dependence on renal dialysis: Secondary | ICD-10-CM | POA: Diagnosis not present

## 2016-05-26 DIAGNOSIS — N2581 Secondary hyperparathyroidism of renal origin: Secondary | ICD-10-CM | POA: Diagnosis not present

## 2016-05-26 DIAGNOSIS — N186 End stage renal disease: Secondary | ICD-10-CM | POA: Diagnosis not present

## 2016-05-27 DIAGNOSIS — E611 Iron deficiency: Secondary | ICD-10-CM | POA: Diagnosis not present

## 2016-05-27 DIAGNOSIS — N186 End stage renal disease: Secondary | ICD-10-CM | POA: Diagnosis not present

## 2016-05-27 DIAGNOSIS — Z992 Dependence on renal dialysis: Secondary | ICD-10-CM | POA: Diagnosis not present

## 2016-05-27 DIAGNOSIS — N2581 Secondary hyperparathyroidism of renal origin: Secondary | ICD-10-CM | POA: Diagnosis not present

## 2016-05-29 DIAGNOSIS — Z992 Dependence on renal dialysis: Secondary | ICD-10-CM | POA: Diagnosis not present

## 2016-05-29 DIAGNOSIS — N2581 Secondary hyperparathyroidism of renal origin: Secondary | ICD-10-CM | POA: Diagnosis not present

## 2016-05-29 DIAGNOSIS — E611 Iron deficiency: Secondary | ICD-10-CM | POA: Diagnosis not present

## 2016-05-29 DIAGNOSIS — N186 End stage renal disease: Secondary | ICD-10-CM | POA: Diagnosis not present

## 2016-06-01 DIAGNOSIS — N2581 Secondary hyperparathyroidism of renal origin: Secondary | ICD-10-CM | POA: Diagnosis not present

## 2016-06-01 DIAGNOSIS — Z992 Dependence on renal dialysis: Secondary | ICD-10-CM | POA: Diagnosis not present

## 2016-06-01 DIAGNOSIS — N186 End stage renal disease: Secondary | ICD-10-CM | POA: Diagnosis not present

## 2016-06-01 DIAGNOSIS — E611 Iron deficiency: Secondary | ICD-10-CM | POA: Diagnosis not present

## 2016-06-03 DIAGNOSIS — N186 End stage renal disease: Secondary | ICD-10-CM | POA: Diagnosis not present

## 2016-06-03 DIAGNOSIS — N2581 Secondary hyperparathyroidism of renal origin: Secondary | ICD-10-CM | POA: Diagnosis not present

## 2016-06-03 DIAGNOSIS — Z992 Dependence on renal dialysis: Secondary | ICD-10-CM | POA: Diagnosis not present

## 2016-06-03 DIAGNOSIS — E611 Iron deficiency: Secondary | ICD-10-CM | POA: Diagnosis not present

## 2016-06-05 DIAGNOSIS — N2581 Secondary hyperparathyroidism of renal origin: Secondary | ICD-10-CM | POA: Diagnosis not present

## 2016-06-05 DIAGNOSIS — E611 Iron deficiency: Secondary | ICD-10-CM | POA: Diagnosis not present

## 2016-06-05 DIAGNOSIS — N186 End stage renal disease: Secondary | ICD-10-CM | POA: Diagnosis not present

## 2016-06-05 DIAGNOSIS — Z992 Dependence on renal dialysis: Secondary | ICD-10-CM | POA: Diagnosis not present

## 2016-06-08 DIAGNOSIS — N186 End stage renal disease: Secondary | ICD-10-CM | POA: Diagnosis not present

## 2016-06-08 DIAGNOSIS — N2581 Secondary hyperparathyroidism of renal origin: Secondary | ICD-10-CM | POA: Diagnosis not present

## 2016-06-08 DIAGNOSIS — Z992 Dependence on renal dialysis: Secondary | ICD-10-CM | POA: Diagnosis not present

## 2016-06-08 DIAGNOSIS — E611 Iron deficiency: Secondary | ICD-10-CM | POA: Diagnosis not present

## 2016-06-10 DIAGNOSIS — N186 End stage renal disease: Secondary | ICD-10-CM | POA: Diagnosis not present

## 2016-06-10 DIAGNOSIS — E611 Iron deficiency: Secondary | ICD-10-CM | POA: Diagnosis not present

## 2016-06-10 DIAGNOSIS — Z992 Dependence on renal dialysis: Secondary | ICD-10-CM | POA: Diagnosis not present

## 2016-06-10 DIAGNOSIS — N2581 Secondary hyperparathyroidism of renal origin: Secondary | ICD-10-CM | POA: Diagnosis not present

## 2016-06-11 DIAGNOSIS — E611 Iron deficiency: Secondary | ICD-10-CM | POA: Diagnosis not present

## 2016-06-11 DIAGNOSIS — Z992 Dependence on renal dialysis: Secondary | ICD-10-CM | POA: Diagnosis not present

## 2016-06-11 DIAGNOSIS — N2581 Secondary hyperparathyroidism of renal origin: Secondary | ICD-10-CM | POA: Diagnosis not present

## 2016-06-11 DIAGNOSIS — N186 End stage renal disease: Secondary | ICD-10-CM | POA: Diagnosis not present

## 2016-06-12 DIAGNOSIS — E611 Iron deficiency: Secondary | ICD-10-CM | POA: Diagnosis not present

## 2016-06-12 DIAGNOSIS — Z992 Dependence on renal dialysis: Secondary | ICD-10-CM | POA: Diagnosis not present

## 2016-06-12 DIAGNOSIS — N2581 Secondary hyperparathyroidism of renal origin: Secondary | ICD-10-CM | POA: Diagnosis not present

## 2016-06-12 DIAGNOSIS — N186 End stage renal disease: Secondary | ICD-10-CM | POA: Diagnosis not present

## 2016-06-15 DIAGNOSIS — E611 Iron deficiency: Secondary | ICD-10-CM | POA: Diagnosis not present

## 2016-06-15 DIAGNOSIS — Z992 Dependence on renal dialysis: Secondary | ICD-10-CM | POA: Diagnosis not present

## 2016-06-15 DIAGNOSIS — N2581 Secondary hyperparathyroidism of renal origin: Secondary | ICD-10-CM | POA: Diagnosis not present

## 2016-06-15 DIAGNOSIS — N186 End stage renal disease: Secondary | ICD-10-CM | POA: Diagnosis not present

## 2016-06-17 DIAGNOSIS — E611 Iron deficiency: Secondary | ICD-10-CM | POA: Diagnosis not present

## 2016-06-17 DIAGNOSIS — Z992 Dependence on renal dialysis: Secondary | ICD-10-CM | POA: Diagnosis not present

## 2016-06-17 DIAGNOSIS — N186 End stage renal disease: Secondary | ICD-10-CM | POA: Diagnosis not present

## 2016-06-17 DIAGNOSIS — N2581 Secondary hyperparathyroidism of renal origin: Secondary | ICD-10-CM | POA: Diagnosis not present

## 2016-06-19 DIAGNOSIS — N2581 Secondary hyperparathyroidism of renal origin: Secondary | ICD-10-CM | POA: Diagnosis not present

## 2016-06-19 DIAGNOSIS — Z992 Dependence on renal dialysis: Secondary | ICD-10-CM | POA: Diagnosis not present

## 2016-06-19 DIAGNOSIS — N186 End stage renal disease: Secondary | ICD-10-CM | POA: Diagnosis not present

## 2016-06-19 DIAGNOSIS — E611 Iron deficiency: Secondary | ICD-10-CM | POA: Diagnosis not present

## 2016-06-22 DIAGNOSIS — E611 Iron deficiency: Secondary | ICD-10-CM | POA: Diagnosis not present

## 2016-06-22 DIAGNOSIS — N2581 Secondary hyperparathyroidism of renal origin: Secondary | ICD-10-CM | POA: Diagnosis not present

## 2016-06-22 DIAGNOSIS — N186 End stage renal disease: Secondary | ICD-10-CM | POA: Diagnosis not present

## 2016-06-22 DIAGNOSIS — Z992 Dependence on renal dialysis: Secondary | ICD-10-CM | POA: Diagnosis not present

## 2016-06-24 DIAGNOSIS — N2581 Secondary hyperparathyroidism of renal origin: Secondary | ICD-10-CM | POA: Diagnosis not present

## 2016-06-24 DIAGNOSIS — N186 End stage renal disease: Secondary | ICD-10-CM | POA: Diagnosis not present

## 2016-06-24 DIAGNOSIS — Z992 Dependence on renal dialysis: Secondary | ICD-10-CM | POA: Diagnosis not present

## 2016-06-24 DIAGNOSIS — E611 Iron deficiency: Secondary | ICD-10-CM | POA: Diagnosis not present

## 2016-06-25 DIAGNOSIS — N186 End stage renal disease: Secondary | ICD-10-CM | POA: Diagnosis not present

## 2016-06-25 DIAGNOSIS — Z992 Dependence on renal dialysis: Secondary | ICD-10-CM | POA: Diagnosis not present

## 2016-06-26 DIAGNOSIS — E611 Iron deficiency: Secondary | ICD-10-CM | POA: Diagnosis not present

## 2016-06-26 DIAGNOSIS — Z992 Dependence on renal dialysis: Secondary | ICD-10-CM | POA: Diagnosis not present

## 2016-06-26 DIAGNOSIS — N2581 Secondary hyperparathyroidism of renal origin: Secondary | ICD-10-CM | POA: Diagnosis not present

## 2016-06-26 DIAGNOSIS — N186 End stage renal disease: Secondary | ICD-10-CM | POA: Diagnosis not present

## 2016-06-29 DIAGNOSIS — N186 End stage renal disease: Secondary | ICD-10-CM | POA: Diagnosis not present

## 2016-06-29 DIAGNOSIS — N2581 Secondary hyperparathyroidism of renal origin: Secondary | ICD-10-CM | POA: Diagnosis not present

## 2016-06-29 DIAGNOSIS — Z992 Dependence on renal dialysis: Secondary | ICD-10-CM | POA: Diagnosis not present

## 2016-06-29 DIAGNOSIS — E611 Iron deficiency: Secondary | ICD-10-CM | POA: Diagnosis not present

## 2016-07-01 DIAGNOSIS — Z992 Dependence on renal dialysis: Secondary | ICD-10-CM | POA: Diagnosis not present

## 2016-07-01 DIAGNOSIS — N186 End stage renal disease: Secondary | ICD-10-CM | POA: Diagnosis not present

## 2016-07-01 DIAGNOSIS — E611 Iron deficiency: Secondary | ICD-10-CM | POA: Diagnosis not present

## 2016-07-01 DIAGNOSIS — N2581 Secondary hyperparathyroidism of renal origin: Secondary | ICD-10-CM | POA: Diagnosis not present

## 2016-07-03 DIAGNOSIS — N186 End stage renal disease: Secondary | ICD-10-CM | POA: Diagnosis not present

## 2016-07-03 DIAGNOSIS — E611 Iron deficiency: Secondary | ICD-10-CM | POA: Diagnosis not present

## 2016-07-03 DIAGNOSIS — Z992 Dependence on renal dialysis: Secondary | ICD-10-CM | POA: Diagnosis not present

## 2016-07-03 DIAGNOSIS — N2581 Secondary hyperparathyroidism of renal origin: Secondary | ICD-10-CM | POA: Diagnosis not present

## 2016-07-06 DIAGNOSIS — Z992 Dependence on renal dialysis: Secondary | ICD-10-CM | POA: Diagnosis not present

## 2016-07-06 DIAGNOSIS — N186 End stage renal disease: Secondary | ICD-10-CM | POA: Diagnosis not present

## 2016-07-06 DIAGNOSIS — N2581 Secondary hyperparathyroidism of renal origin: Secondary | ICD-10-CM | POA: Diagnosis not present

## 2016-07-06 DIAGNOSIS — E611 Iron deficiency: Secondary | ICD-10-CM | POA: Diagnosis not present

## 2016-07-08 DIAGNOSIS — Z992 Dependence on renal dialysis: Secondary | ICD-10-CM | POA: Diagnosis not present

## 2016-07-08 DIAGNOSIS — N186 End stage renal disease: Secondary | ICD-10-CM | POA: Diagnosis not present

## 2016-07-08 DIAGNOSIS — N2581 Secondary hyperparathyroidism of renal origin: Secondary | ICD-10-CM | POA: Diagnosis not present

## 2016-07-08 DIAGNOSIS — E611 Iron deficiency: Secondary | ICD-10-CM | POA: Diagnosis not present

## 2016-07-10 DIAGNOSIS — Z992 Dependence on renal dialysis: Secondary | ICD-10-CM | POA: Diagnosis not present

## 2016-07-10 DIAGNOSIS — N186 End stage renal disease: Secondary | ICD-10-CM | POA: Diagnosis not present

## 2016-07-10 DIAGNOSIS — N2581 Secondary hyperparathyroidism of renal origin: Secondary | ICD-10-CM | POA: Diagnosis not present

## 2016-07-10 DIAGNOSIS — E611 Iron deficiency: Secondary | ICD-10-CM | POA: Diagnosis not present

## 2016-07-11 DIAGNOSIS — Z992 Dependence on renal dialysis: Secondary | ICD-10-CM | POA: Diagnosis not present

## 2016-07-11 DIAGNOSIS — N186 End stage renal disease: Secondary | ICD-10-CM | POA: Diagnosis not present

## 2016-07-11 DIAGNOSIS — N2581 Secondary hyperparathyroidism of renal origin: Secondary | ICD-10-CM | POA: Diagnosis not present

## 2016-07-11 DIAGNOSIS — E611 Iron deficiency: Secondary | ICD-10-CM | POA: Diagnosis not present

## 2016-07-13 DIAGNOSIS — N186 End stage renal disease: Secondary | ICD-10-CM | POA: Diagnosis not present

## 2016-07-13 DIAGNOSIS — N2581 Secondary hyperparathyroidism of renal origin: Secondary | ICD-10-CM | POA: Diagnosis not present

## 2016-07-13 DIAGNOSIS — E611 Iron deficiency: Secondary | ICD-10-CM | POA: Diagnosis not present

## 2016-07-13 DIAGNOSIS — Z992 Dependence on renal dialysis: Secondary | ICD-10-CM | POA: Diagnosis not present

## 2016-07-15 DIAGNOSIS — Z992 Dependence on renal dialysis: Secondary | ICD-10-CM | POA: Diagnosis not present

## 2016-07-15 DIAGNOSIS — N186 End stage renal disease: Secondary | ICD-10-CM | POA: Diagnosis not present

## 2016-07-15 DIAGNOSIS — N2581 Secondary hyperparathyroidism of renal origin: Secondary | ICD-10-CM | POA: Diagnosis not present

## 2016-07-15 DIAGNOSIS — E611 Iron deficiency: Secondary | ICD-10-CM | POA: Diagnosis not present

## 2016-07-17 DIAGNOSIS — Z992 Dependence on renal dialysis: Secondary | ICD-10-CM | POA: Diagnosis not present

## 2016-07-17 DIAGNOSIS — N186 End stage renal disease: Secondary | ICD-10-CM | POA: Diagnosis not present

## 2016-07-17 DIAGNOSIS — E611 Iron deficiency: Secondary | ICD-10-CM | POA: Diagnosis not present

## 2016-07-17 DIAGNOSIS — N2581 Secondary hyperparathyroidism of renal origin: Secondary | ICD-10-CM | POA: Diagnosis not present

## 2016-07-20 DIAGNOSIS — Z992 Dependence on renal dialysis: Secondary | ICD-10-CM | POA: Diagnosis not present

## 2016-07-20 DIAGNOSIS — N2581 Secondary hyperparathyroidism of renal origin: Secondary | ICD-10-CM | POA: Diagnosis not present

## 2016-07-20 DIAGNOSIS — E611 Iron deficiency: Secondary | ICD-10-CM | POA: Diagnosis not present

## 2016-07-20 DIAGNOSIS — N186 End stage renal disease: Secondary | ICD-10-CM | POA: Diagnosis not present

## 2016-07-22 DIAGNOSIS — N186 End stage renal disease: Secondary | ICD-10-CM | POA: Diagnosis not present

## 2016-07-22 DIAGNOSIS — N2581 Secondary hyperparathyroidism of renal origin: Secondary | ICD-10-CM | POA: Diagnosis not present

## 2016-07-22 DIAGNOSIS — E611 Iron deficiency: Secondary | ICD-10-CM | POA: Diagnosis not present

## 2016-07-22 DIAGNOSIS — Z992 Dependence on renal dialysis: Secondary | ICD-10-CM | POA: Diagnosis not present

## 2016-07-24 DIAGNOSIS — N2581 Secondary hyperparathyroidism of renal origin: Secondary | ICD-10-CM | POA: Diagnosis not present

## 2016-07-24 DIAGNOSIS — N186 End stage renal disease: Secondary | ICD-10-CM | POA: Diagnosis not present

## 2016-07-24 DIAGNOSIS — E611 Iron deficiency: Secondary | ICD-10-CM | POA: Diagnosis not present

## 2016-07-24 DIAGNOSIS — Z992 Dependence on renal dialysis: Secondary | ICD-10-CM | POA: Diagnosis not present

## 2016-07-25 DIAGNOSIS — N186 End stage renal disease: Secondary | ICD-10-CM | POA: Diagnosis not present

## 2016-07-25 DIAGNOSIS — Z992 Dependence on renal dialysis: Secondary | ICD-10-CM | POA: Diagnosis not present

## 2016-07-26 DIAGNOSIS — Z992 Dependence on renal dialysis: Secondary | ICD-10-CM | POA: Diagnosis not present

## 2016-07-26 DIAGNOSIS — N2581 Secondary hyperparathyroidism of renal origin: Secondary | ICD-10-CM | POA: Diagnosis not present

## 2016-07-26 DIAGNOSIS — D509 Iron deficiency anemia, unspecified: Secondary | ICD-10-CM | POA: Diagnosis not present

## 2016-07-26 DIAGNOSIS — D631 Anemia in chronic kidney disease: Secondary | ICD-10-CM | POA: Diagnosis not present

## 2016-07-26 DIAGNOSIS — N186 End stage renal disease: Secondary | ICD-10-CM | POA: Diagnosis not present

## 2016-07-27 DIAGNOSIS — D509 Iron deficiency anemia, unspecified: Secondary | ICD-10-CM | POA: Diagnosis not present

## 2016-07-27 DIAGNOSIS — Z992 Dependence on renal dialysis: Secondary | ICD-10-CM | POA: Diagnosis not present

## 2016-07-27 DIAGNOSIS — N186 End stage renal disease: Secondary | ICD-10-CM | POA: Diagnosis not present

## 2016-07-27 DIAGNOSIS — D631 Anemia in chronic kidney disease: Secondary | ICD-10-CM | POA: Diagnosis not present

## 2016-07-27 DIAGNOSIS — N2581 Secondary hyperparathyroidism of renal origin: Secondary | ICD-10-CM | POA: Diagnosis not present

## 2016-07-29 DIAGNOSIS — N186 End stage renal disease: Secondary | ICD-10-CM | POA: Diagnosis not present

## 2016-07-29 DIAGNOSIS — D509 Iron deficiency anemia, unspecified: Secondary | ICD-10-CM | POA: Diagnosis not present

## 2016-07-29 DIAGNOSIS — N2581 Secondary hyperparathyroidism of renal origin: Secondary | ICD-10-CM | POA: Diagnosis not present

## 2016-07-29 DIAGNOSIS — D631 Anemia in chronic kidney disease: Secondary | ICD-10-CM | POA: Diagnosis not present

## 2016-07-29 DIAGNOSIS — Z992 Dependence on renal dialysis: Secondary | ICD-10-CM | POA: Diagnosis not present

## 2016-07-31 DIAGNOSIS — D509 Iron deficiency anemia, unspecified: Secondary | ICD-10-CM | POA: Diagnosis not present

## 2016-07-31 DIAGNOSIS — N2581 Secondary hyperparathyroidism of renal origin: Secondary | ICD-10-CM | POA: Diagnosis not present

## 2016-07-31 DIAGNOSIS — D631 Anemia in chronic kidney disease: Secondary | ICD-10-CM | POA: Diagnosis not present

## 2016-07-31 DIAGNOSIS — Z992 Dependence on renal dialysis: Secondary | ICD-10-CM | POA: Diagnosis not present

## 2016-07-31 DIAGNOSIS — N186 End stage renal disease: Secondary | ICD-10-CM | POA: Diagnosis not present

## 2016-08-03 DIAGNOSIS — D509 Iron deficiency anemia, unspecified: Secondary | ICD-10-CM | POA: Diagnosis not present

## 2016-08-03 DIAGNOSIS — N2581 Secondary hyperparathyroidism of renal origin: Secondary | ICD-10-CM | POA: Diagnosis not present

## 2016-08-03 DIAGNOSIS — D631 Anemia in chronic kidney disease: Secondary | ICD-10-CM | POA: Diagnosis not present

## 2016-08-03 DIAGNOSIS — Z992 Dependence on renal dialysis: Secondary | ICD-10-CM | POA: Diagnosis not present

## 2016-08-03 DIAGNOSIS — N186 End stage renal disease: Secondary | ICD-10-CM | POA: Diagnosis not present

## 2016-08-05 DIAGNOSIS — N2581 Secondary hyperparathyroidism of renal origin: Secondary | ICD-10-CM | POA: Diagnosis not present

## 2016-08-05 DIAGNOSIS — D509 Iron deficiency anemia, unspecified: Secondary | ICD-10-CM | POA: Diagnosis not present

## 2016-08-05 DIAGNOSIS — N186 End stage renal disease: Secondary | ICD-10-CM | POA: Diagnosis not present

## 2016-08-05 DIAGNOSIS — Z992 Dependence on renal dialysis: Secondary | ICD-10-CM | POA: Diagnosis not present

## 2016-08-05 DIAGNOSIS — D631 Anemia in chronic kidney disease: Secondary | ICD-10-CM | POA: Diagnosis not present

## 2016-08-07 DIAGNOSIS — D631 Anemia in chronic kidney disease: Secondary | ICD-10-CM | POA: Diagnosis not present

## 2016-08-07 DIAGNOSIS — N186 End stage renal disease: Secondary | ICD-10-CM | POA: Diagnosis not present

## 2016-08-07 DIAGNOSIS — Z992 Dependence on renal dialysis: Secondary | ICD-10-CM | POA: Diagnosis not present

## 2016-08-07 DIAGNOSIS — D509 Iron deficiency anemia, unspecified: Secondary | ICD-10-CM | POA: Diagnosis not present

## 2016-08-07 DIAGNOSIS — N2581 Secondary hyperparathyroidism of renal origin: Secondary | ICD-10-CM | POA: Diagnosis not present

## 2016-08-10 DIAGNOSIS — D631 Anemia in chronic kidney disease: Secondary | ICD-10-CM | POA: Diagnosis not present

## 2016-08-10 DIAGNOSIS — N186 End stage renal disease: Secondary | ICD-10-CM | POA: Diagnosis not present

## 2016-08-10 DIAGNOSIS — D509 Iron deficiency anemia, unspecified: Secondary | ICD-10-CM | POA: Diagnosis not present

## 2016-08-10 DIAGNOSIS — N2581 Secondary hyperparathyroidism of renal origin: Secondary | ICD-10-CM | POA: Diagnosis not present

## 2016-08-10 DIAGNOSIS — Z992 Dependence on renal dialysis: Secondary | ICD-10-CM | POA: Diagnosis not present

## 2016-08-12 DIAGNOSIS — N2581 Secondary hyperparathyroidism of renal origin: Secondary | ICD-10-CM | POA: Diagnosis not present

## 2016-08-12 DIAGNOSIS — N186 End stage renal disease: Secondary | ICD-10-CM | POA: Diagnosis not present

## 2016-08-12 DIAGNOSIS — D509 Iron deficiency anemia, unspecified: Secondary | ICD-10-CM | POA: Diagnosis not present

## 2016-08-12 DIAGNOSIS — Z992 Dependence on renal dialysis: Secondary | ICD-10-CM | POA: Diagnosis not present

## 2016-08-12 DIAGNOSIS — D631 Anemia in chronic kidney disease: Secondary | ICD-10-CM | POA: Diagnosis not present

## 2016-08-14 DIAGNOSIS — N2581 Secondary hyperparathyroidism of renal origin: Secondary | ICD-10-CM | POA: Diagnosis not present

## 2016-08-14 DIAGNOSIS — N186 End stage renal disease: Secondary | ICD-10-CM | POA: Diagnosis not present

## 2016-08-14 DIAGNOSIS — D509 Iron deficiency anemia, unspecified: Secondary | ICD-10-CM | POA: Diagnosis not present

## 2016-08-14 DIAGNOSIS — Z992 Dependence on renal dialysis: Secondary | ICD-10-CM | POA: Diagnosis not present

## 2016-08-14 DIAGNOSIS — D631 Anemia in chronic kidney disease: Secondary | ICD-10-CM | POA: Diagnosis not present

## 2016-08-17 DIAGNOSIS — Z992 Dependence on renal dialysis: Secondary | ICD-10-CM | POA: Diagnosis not present

## 2016-08-17 DIAGNOSIS — N2581 Secondary hyperparathyroidism of renal origin: Secondary | ICD-10-CM | POA: Diagnosis not present

## 2016-08-17 DIAGNOSIS — N186 End stage renal disease: Secondary | ICD-10-CM | POA: Diagnosis not present

## 2016-08-17 DIAGNOSIS — D509 Iron deficiency anemia, unspecified: Secondary | ICD-10-CM | POA: Diagnosis not present

## 2016-08-17 DIAGNOSIS — D631 Anemia in chronic kidney disease: Secondary | ICD-10-CM | POA: Diagnosis not present

## 2016-08-19 DIAGNOSIS — Z992 Dependence on renal dialysis: Secondary | ICD-10-CM | POA: Diagnosis not present

## 2016-08-19 DIAGNOSIS — D509 Iron deficiency anemia, unspecified: Secondary | ICD-10-CM | POA: Diagnosis not present

## 2016-08-19 DIAGNOSIS — D631 Anemia in chronic kidney disease: Secondary | ICD-10-CM | POA: Diagnosis not present

## 2016-08-19 DIAGNOSIS — N2581 Secondary hyperparathyroidism of renal origin: Secondary | ICD-10-CM | POA: Diagnosis not present

## 2016-08-19 DIAGNOSIS — N186 End stage renal disease: Secondary | ICD-10-CM | POA: Diagnosis not present

## 2016-08-21 DIAGNOSIS — D631 Anemia in chronic kidney disease: Secondary | ICD-10-CM | POA: Diagnosis not present

## 2016-08-21 DIAGNOSIS — D509 Iron deficiency anemia, unspecified: Secondary | ICD-10-CM | POA: Diagnosis not present

## 2016-08-21 DIAGNOSIS — N2581 Secondary hyperparathyroidism of renal origin: Secondary | ICD-10-CM | POA: Diagnosis not present

## 2016-08-21 DIAGNOSIS — Z992 Dependence on renal dialysis: Secondary | ICD-10-CM | POA: Diagnosis not present

## 2016-08-21 DIAGNOSIS — N186 End stage renal disease: Secondary | ICD-10-CM | POA: Diagnosis not present

## 2016-08-24 DIAGNOSIS — D509 Iron deficiency anemia, unspecified: Secondary | ICD-10-CM | POA: Diagnosis not present

## 2016-08-24 DIAGNOSIS — N186 End stage renal disease: Secondary | ICD-10-CM | POA: Diagnosis not present

## 2016-08-24 DIAGNOSIS — D631 Anemia in chronic kidney disease: Secondary | ICD-10-CM | POA: Diagnosis not present

## 2016-08-24 DIAGNOSIS — Z992 Dependence on renal dialysis: Secondary | ICD-10-CM | POA: Diagnosis not present

## 2016-08-24 DIAGNOSIS — N2581 Secondary hyperparathyroidism of renal origin: Secondary | ICD-10-CM | POA: Diagnosis not present

## 2016-08-25 DIAGNOSIS — N186 End stage renal disease: Secondary | ICD-10-CM | POA: Diagnosis not present

## 2016-08-25 DIAGNOSIS — Z992 Dependence on renal dialysis: Secondary | ICD-10-CM | POA: Diagnosis not present

## 2016-08-26 DIAGNOSIS — Z992 Dependence on renal dialysis: Secondary | ICD-10-CM | POA: Diagnosis not present

## 2016-08-26 DIAGNOSIS — N2581 Secondary hyperparathyroidism of renal origin: Secondary | ICD-10-CM | POA: Diagnosis not present

## 2016-08-26 DIAGNOSIS — D631 Anemia in chronic kidney disease: Secondary | ICD-10-CM | POA: Diagnosis not present

## 2016-08-26 DIAGNOSIS — N186 End stage renal disease: Secondary | ICD-10-CM | POA: Diagnosis not present

## 2016-08-26 DIAGNOSIS — E611 Iron deficiency: Secondary | ICD-10-CM | POA: Diagnosis not present

## 2016-08-28 DIAGNOSIS — D631 Anemia in chronic kidney disease: Secondary | ICD-10-CM | POA: Diagnosis not present

## 2016-08-28 DIAGNOSIS — N2581 Secondary hyperparathyroidism of renal origin: Secondary | ICD-10-CM | POA: Diagnosis not present

## 2016-08-28 DIAGNOSIS — E611 Iron deficiency: Secondary | ICD-10-CM | POA: Diagnosis not present

## 2016-08-28 DIAGNOSIS — Z992 Dependence on renal dialysis: Secondary | ICD-10-CM | POA: Diagnosis not present

## 2016-08-28 DIAGNOSIS — N186 End stage renal disease: Secondary | ICD-10-CM | POA: Diagnosis not present

## 2016-08-31 DIAGNOSIS — N2581 Secondary hyperparathyroidism of renal origin: Secondary | ICD-10-CM | POA: Diagnosis not present

## 2016-08-31 DIAGNOSIS — E611 Iron deficiency: Secondary | ICD-10-CM | POA: Diagnosis not present

## 2016-08-31 DIAGNOSIS — D631 Anemia in chronic kidney disease: Secondary | ICD-10-CM | POA: Diagnosis not present

## 2016-08-31 DIAGNOSIS — N186 End stage renal disease: Secondary | ICD-10-CM | POA: Diagnosis not present

## 2016-08-31 DIAGNOSIS — Z992 Dependence on renal dialysis: Secondary | ICD-10-CM | POA: Diagnosis not present

## 2016-09-02 DIAGNOSIS — N186 End stage renal disease: Secondary | ICD-10-CM | POA: Diagnosis not present

## 2016-09-02 DIAGNOSIS — D631 Anemia in chronic kidney disease: Secondary | ICD-10-CM | POA: Diagnosis not present

## 2016-09-02 DIAGNOSIS — E611 Iron deficiency: Secondary | ICD-10-CM | POA: Diagnosis not present

## 2016-09-02 DIAGNOSIS — Z992 Dependence on renal dialysis: Secondary | ICD-10-CM | POA: Diagnosis not present

## 2016-09-02 DIAGNOSIS — N2581 Secondary hyperparathyroidism of renal origin: Secondary | ICD-10-CM | POA: Diagnosis not present

## 2016-09-04 DIAGNOSIS — N186 End stage renal disease: Secondary | ICD-10-CM | POA: Diagnosis not present

## 2016-09-04 DIAGNOSIS — N2581 Secondary hyperparathyroidism of renal origin: Secondary | ICD-10-CM | POA: Diagnosis not present

## 2016-09-04 DIAGNOSIS — D631 Anemia in chronic kidney disease: Secondary | ICD-10-CM | POA: Diagnosis not present

## 2016-09-04 DIAGNOSIS — Z992 Dependence on renal dialysis: Secondary | ICD-10-CM | POA: Diagnosis not present

## 2016-09-04 DIAGNOSIS — E611 Iron deficiency: Secondary | ICD-10-CM | POA: Diagnosis not present

## 2016-09-07 DIAGNOSIS — E611 Iron deficiency: Secondary | ICD-10-CM | POA: Diagnosis not present

## 2016-09-07 DIAGNOSIS — N2581 Secondary hyperparathyroidism of renal origin: Secondary | ICD-10-CM | POA: Diagnosis not present

## 2016-09-07 DIAGNOSIS — D631 Anemia in chronic kidney disease: Secondary | ICD-10-CM | POA: Diagnosis not present

## 2016-09-07 DIAGNOSIS — Z992 Dependence on renal dialysis: Secondary | ICD-10-CM | POA: Diagnosis not present

## 2016-09-07 DIAGNOSIS — N186 End stage renal disease: Secondary | ICD-10-CM | POA: Diagnosis not present

## 2016-09-09 DIAGNOSIS — D631 Anemia in chronic kidney disease: Secondary | ICD-10-CM | POA: Diagnosis not present

## 2016-09-09 DIAGNOSIS — N2581 Secondary hyperparathyroidism of renal origin: Secondary | ICD-10-CM | POA: Diagnosis not present

## 2016-09-09 DIAGNOSIS — N186 End stage renal disease: Secondary | ICD-10-CM | POA: Diagnosis not present

## 2016-09-09 DIAGNOSIS — Z992 Dependence on renal dialysis: Secondary | ICD-10-CM | POA: Diagnosis not present

## 2016-09-09 DIAGNOSIS — E611 Iron deficiency: Secondary | ICD-10-CM | POA: Diagnosis not present

## 2016-09-11 DIAGNOSIS — N186 End stage renal disease: Secondary | ICD-10-CM | POA: Diagnosis not present

## 2016-09-11 DIAGNOSIS — E611 Iron deficiency: Secondary | ICD-10-CM | POA: Diagnosis not present

## 2016-09-11 DIAGNOSIS — N2581 Secondary hyperparathyroidism of renal origin: Secondary | ICD-10-CM | POA: Diagnosis not present

## 2016-09-11 DIAGNOSIS — Z992 Dependence on renal dialysis: Secondary | ICD-10-CM | POA: Diagnosis not present

## 2016-09-11 DIAGNOSIS — D631 Anemia in chronic kidney disease: Secondary | ICD-10-CM | POA: Diagnosis not present

## 2016-09-14 DIAGNOSIS — D631 Anemia in chronic kidney disease: Secondary | ICD-10-CM | POA: Diagnosis not present

## 2016-09-14 DIAGNOSIS — N186 End stage renal disease: Secondary | ICD-10-CM | POA: Diagnosis not present

## 2016-09-14 DIAGNOSIS — E611 Iron deficiency: Secondary | ICD-10-CM | POA: Diagnosis not present

## 2016-09-14 DIAGNOSIS — Z992 Dependence on renal dialysis: Secondary | ICD-10-CM | POA: Diagnosis not present

## 2016-09-14 DIAGNOSIS — N2581 Secondary hyperparathyroidism of renal origin: Secondary | ICD-10-CM | POA: Diagnosis not present

## 2016-09-16 DIAGNOSIS — E611 Iron deficiency: Secondary | ICD-10-CM | POA: Diagnosis not present

## 2016-09-16 DIAGNOSIS — N186 End stage renal disease: Secondary | ICD-10-CM | POA: Diagnosis not present

## 2016-09-16 DIAGNOSIS — D631 Anemia in chronic kidney disease: Secondary | ICD-10-CM | POA: Diagnosis not present

## 2016-09-16 DIAGNOSIS — N2581 Secondary hyperparathyroidism of renal origin: Secondary | ICD-10-CM | POA: Diagnosis not present

## 2016-09-16 DIAGNOSIS — Z992 Dependence on renal dialysis: Secondary | ICD-10-CM | POA: Diagnosis not present

## 2016-09-18 DIAGNOSIS — E611 Iron deficiency: Secondary | ICD-10-CM | POA: Diagnosis not present

## 2016-09-18 DIAGNOSIS — D631 Anemia in chronic kidney disease: Secondary | ICD-10-CM | POA: Diagnosis not present

## 2016-09-18 DIAGNOSIS — N2581 Secondary hyperparathyroidism of renal origin: Secondary | ICD-10-CM | POA: Diagnosis not present

## 2016-09-18 DIAGNOSIS — Z992 Dependence on renal dialysis: Secondary | ICD-10-CM | POA: Diagnosis not present

## 2016-09-18 DIAGNOSIS — N186 End stage renal disease: Secondary | ICD-10-CM | POA: Diagnosis not present

## 2016-09-21 DIAGNOSIS — D631 Anemia in chronic kidney disease: Secondary | ICD-10-CM | POA: Diagnosis not present

## 2016-09-21 DIAGNOSIS — Z992 Dependence on renal dialysis: Secondary | ICD-10-CM | POA: Diagnosis not present

## 2016-09-21 DIAGNOSIS — N2581 Secondary hyperparathyroidism of renal origin: Secondary | ICD-10-CM | POA: Diagnosis not present

## 2016-09-21 DIAGNOSIS — E611 Iron deficiency: Secondary | ICD-10-CM | POA: Diagnosis not present

## 2016-09-21 DIAGNOSIS — N186 End stage renal disease: Secondary | ICD-10-CM | POA: Diagnosis not present

## 2016-09-23 DIAGNOSIS — D631 Anemia in chronic kidney disease: Secondary | ICD-10-CM | POA: Diagnosis not present

## 2016-09-23 DIAGNOSIS — N2581 Secondary hyperparathyroidism of renal origin: Secondary | ICD-10-CM | POA: Diagnosis not present

## 2016-09-23 DIAGNOSIS — Z992 Dependence on renal dialysis: Secondary | ICD-10-CM | POA: Diagnosis not present

## 2016-09-23 DIAGNOSIS — N186 End stage renal disease: Secondary | ICD-10-CM | POA: Diagnosis not present

## 2016-09-23 DIAGNOSIS — E611 Iron deficiency: Secondary | ICD-10-CM | POA: Diagnosis not present

## 2016-09-25 DIAGNOSIS — N2581 Secondary hyperparathyroidism of renal origin: Secondary | ICD-10-CM | POA: Diagnosis not present

## 2016-09-25 DIAGNOSIS — E611 Iron deficiency: Secondary | ICD-10-CM | POA: Diagnosis not present

## 2016-09-25 DIAGNOSIS — D631 Anemia in chronic kidney disease: Secondary | ICD-10-CM | POA: Diagnosis not present

## 2016-09-25 DIAGNOSIS — Z992 Dependence on renal dialysis: Secondary | ICD-10-CM | POA: Diagnosis not present

## 2016-09-25 DIAGNOSIS — N186 End stage renal disease: Secondary | ICD-10-CM | POA: Diagnosis not present

## 2016-09-26 DIAGNOSIS — N2581 Secondary hyperparathyroidism of renal origin: Secondary | ICD-10-CM | POA: Diagnosis not present

## 2016-09-26 DIAGNOSIS — N186 End stage renal disease: Secondary | ICD-10-CM | POA: Diagnosis not present

## 2016-09-26 DIAGNOSIS — Z992 Dependence on renal dialysis: Secondary | ICD-10-CM | POA: Diagnosis not present

## 2016-09-26 DIAGNOSIS — E611 Iron deficiency: Secondary | ICD-10-CM | POA: Diagnosis not present

## 2016-09-28 DIAGNOSIS — E611 Iron deficiency: Secondary | ICD-10-CM | POA: Diagnosis not present

## 2016-09-28 DIAGNOSIS — Z992 Dependence on renal dialysis: Secondary | ICD-10-CM | POA: Diagnosis not present

## 2016-09-28 DIAGNOSIS — N2581 Secondary hyperparathyroidism of renal origin: Secondary | ICD-10-CM | POA: Diagnosis not present

## 2016-09-28 DIAGNOSIS — N186 End stage renal disease: Secondary | ICD-10-CM | POA: Diagnosis not present

## 2016-09-30 DIAGNOSIS — N2581 Secondary hyperparathyroidism of renal origin: Secondary | ICD-10-CM | POA: Diagnosis not present

## 2016-09-30 DIAGNOSIS — E611 Iron deficiency: Secondary | ICD-10-CM | POA: Diagnosis not present

## 2016-09-30 DIAGNOSIS — N186 End stage renal disease: Secondary | ICD-10-CM | POA: Diagnosis not present

## 2016-09-30 DIAGNOSIS — Z992 Dependence on renal dialysis: Secondary | ICD-10-CM | POA: Diagnosis not present

## 2016-10-02 DIAGNOSIS — N186 End stage renal disease: Secondary | ICD-10-CM | POA: Diagnosis not present

## 2016-10-02 DIAGNOSIS — Z992 Dependence on renal dialysis: Secondary | ICD-10-CM | POA: Diagnosis not present

## 2016-10-02 DIAGNOSIS — E611 Iron deficiency: Secondary | ICD-10-CM | POA: Diagnosis not present

## 2016-10-02 DIAGNOSIS — N2581 Secondary hyperparathyroidism of renal origin: Secondary | ICD-10-CM | POA: Diagnosis not present

## 2016-10-05 DIAGNOSIS — Z992 Dependence on renal dialysis: Secondary | ICD-10-CM | POA: Diagnosis not present

## 2016-10-05 DIAGNOSIS — N2581 Secondary hyperparathyroidism of renal origin: Secondary | ICD-10-CM | POA: Diagnosis not present

## 2016-10-05 DIAGNOSIS — N186 End stage renal disease: Secondary | ICD-10-CM | POA: Diagnosis not present

## 2016-10-05 DIAGNOSIS — E611 Iron deficiency: Secondary | ICD-10-CM | POA: Diagnosis not present

## 2016-10-07 DIAGNOSIS — E611 Iron deficiency: Secondary | ICD-10-CM | POA: Diagnosis not present

## 2016-10-07 DIAGNOSIS — N2581 Secondary hyperparathyroidism of renal origin: Secondary | ICD-10-CM | POA: Diagnosis not present

## 2016-10-07 DIAGNOSIS — Z992 Dependence on renal dialysis: Secondary | ICD-10-CM | POA: Diagnosis not present

## 2016-10-07 DIAGNOSIS — N186 End stage renal disease: Secondary | ICD-10-CM | POA: Diagnosis not present

## 2016-10-09 DIAGNOSIS — E611 Iron deficiency: Secondary | ICD-10-CM | POA: Diagnosis not present

## 2016-10-09 DIAGNOSIS — N186 End stage renal disease: Secondary | ICD-10-CM | POA: Diagnosis not present

## 2016-10-09 DIAGNOSIS — Z992 Dependence on renal dialysis: Secondary | ICD-10-CM | POA: Diagnosis not present

## 2016-10-09 DIAGNOSIS — N2581 Secondary hyperparathyroidism of renal origin: Secondary | ICD-10-CM | POA: Diagnosis not present

## 2016-10-12 DIAGNOSIS — N186 End stage renal disease: Secondary | ICD-10-CM | POA: Diagnosis not present

## 2016-10-12 DIAGNOSIS — N2581 Secondary hyperparathyroidism of renal origin: Secondary | ICD-10-CM | POA: Diagnosis not present

## 2016-10-12 DIAGNOSIS — Z992 Dependence on renal dialysis: Secondary | ICD-10-CM | POA: Diagnosis not present

## 2016-10-12 DIAGNOSIS — E611 Iron deficiency: Secondary | ICD-10-CM | POA: Diagnosis not present

## 2016-10-14 DIAGNOSIS — N2581 Secondary hyperparathyroidism of renal origin: Secondary | ICD-10-CM | POA: Diagnosis not present

## 2016-10-14 DIAGNOSIS — Z992 Dependence on renal dialysis: Secondary | ICD-10-CM | POA: Diagnosis not present

## 2016-10-14 DIAGNOSIS — E611 Iron deficiency: Secondary | ICD-10-CM | POA: Diagnosis not present

## 2016-10-14 DIAGNOSIS — N186 End stage renal disease: Secondary | ICD-10-CM | POA: Diagnosis not present

## 2016-10-16 DIAGNOSIS — N2581 Secondary hyperparathyroidism of renal origin: Secondary | ICD-10-CM | POA: Diagnosis not present

## 2016-10-16 DIAGNOSIS — Z992 Dependence on renal dialysis: Secondary | ICD-10-CM | POA: Diagnosis not present

## 2016-10-16 DIAGNOSIS — E611 Iron deficiency: Secondary | ICD-10-CM | POA: Diagnosis not present

## 2016-10-16 DIAGNOSIS — N186 End stage renal disease: Secondary | ICD-10-CM | POA: Diagnosis not present

## 2016-10-19 DIAGNOSIS — N186 End stage renal disease: Secondary | ICD-10-CM | POA: Diagnosis not present

## 2016-10-19 DIAGNOSIS — E611 Iron deficiency: Secondary | ICD-10-CM | POA: Diagnosis not present

## 2016-10-19 DIAGNOSIS — N2581 Secondary hyperparathyroidism of renal origin: Secondary | ICD-10-CM | POA: Diagnosis not present

## 2016-10-19 DIAGNOSIS — Z992 Dependence on renal dialysis: Secondary | ICD-10-CM | POA: Diagnosis not present

## 2016-10-20 ENCOUNTER — Ambulatory Visit (INDEPENDENT_AMBULATORY_CARE_PROVIDER_SITE_OTHER): Payer: Medicare Other | Admitting: Vascular Surgery

## 2016-10-20 ENCOUNTER — Other Ambulatory Visit: Payer: Self-pay

## 2016-10-20 ENCOUNTER — Encounter: Payer: Self-pay | Admitting: Vascular Surgery

## 2016-10-20 VITALS — BP 138/87 | HR 91 | Ht 66.0 in | Wt 179.4 lb

## 2016-10-20 DIAGNOSIS — Z992 Dependence on renal dialysis: Secondary | ICD-10-CM | POA: Diagnosis not present

## 2016-10-20 DIAGNOSIS — N186 End stage renal disease: Secondary | ICD-10-CM

## 2016-10-20 NOTE — Progress Notes (Signed)
[facility] of [place]  Patient name: Mario Alexander MRN: [id number] DOB: [birth year] Sex: male  REASON FOR VISIT: Follow-up left radiocephalic AV fistula  HPI: Mario Alexander is a 46 y.o. male here today for follow-up of left radiocephalic AV fistula. He has had long-standing use of this and on one occasion has undergone plication due to aneurysmal change. Centrally under went shuntogram looking for a more proximal source of stenosis and this was not found. 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 The patient reports to me that he is not having any issues with hemodialysis runs specifically with no high pressures. I do not have documentation of this.  Past Medical History:  Diagnosis Date  . CHF (congestive heart failure) (HCC)   . COPD (chronic obstructive pulmonary disease) (HCC)   . Coronary atherosclerosis of native coronary artery    Multivessel status post CABG at [facility] 07/2011, LVEF 70%  . ESRD on hemodialysis (HCC)   . Essential hypertension, benign   . History of medication noncompliance   . History of stroke 2012   No ASD, PFO, SOE by TEE 01/2010  . Hypertensive heart disease   . Mixed hyperlipidemia   . Peripheral vascular disease (HCC)     Family History  Problem Relation Age of Onset  . Diabetes Mother   . Hypertension Mother   . Hyperlipidemia Mother   . Heart disease Mother        before age 56  . COPD Father   . Lung cancer Father   . Cancer Father     SOCIAL HISTORY: Social History  Substance Use Topics  . Smoking status: Never Smoker  . Smokeless tobacco: Never Used  . Alcohol use No    No Known Allergies  Current Outpatient Prescriptions  Medication Sig Dispense Refill  . amLODipine (NORVASC) 10 MG tablet     . aspirin 81 MG tablet Take 81 mg by mouth daily.    . cloNIDine (CATAPRES) 0.2 MG tablet Take 0.2 mg by  mouth 2 (two) times daily.    . hydrALAZINE (APRESOLINE) 50 MG tablet Take 50 mg by mouth 2 (two) times daily.    . multivitamin (RENA-VIT) TABS tablet Take 1 tablet by mouth 3 (three) times daily.     . SENSIPAR 30 MG tablet Take 1 tablet by mouth daily.    . sevelamer carbonate (RENVELA) 800 MG tablet Take 800 mg by mouth 3 (three) times daily with meals.     No current facility-administered medications for this visit.     REVIEW OF SYSTEMS:   denotes positive finding,  denotes negative finding Cardiac  Comments:  Chest pain or chest pressure:    Shortness of breath upon exertion:    Short of breath when lying flat:    Irregular heart rhythm:        Vascular    Pain in calf, thigh, or hip brought on by ambulation:    Pain in feet at night that wakes you up from your sleep:     Blood clot in your veins:    Leg swelling:           PHYSICAL EXAM: Vitals:   10/20/16 1101  BP: 138/87  Pulse: 91  SpO2: 99%  Weight: 179 lb 6.4 oz (81.4 kg)  Height:  (1.676 m)    GENERAL: The patient is a well-nourished male, in no  acute distress. The vital signs are documented above. CARDIOVASCULAR: He does have a large fistula with some tortuosity and his forearm on the left. By physical exam the drainage out of this is through the basilic vein at the antecubital space. He has a very pulsatile nature both the enlarged portion of the fistula and the fistula at the antecubital space. There is a very distinct change to a thrill in the mid upper arm suggesting a tight stenosis at this level. PULMONARY: There is good air exchange  MUSCULOSKELETAL: There are no major deformities or cyanosis. NEUROLOGIC: No focal weakness or paresthesias are detected. SKIN: There are no ulcers or rashes noted. PSYCHIATRIC: The patient has a normal affect.  DATA:  None  MEDICAL ISSUES: I discussed the significance of his enlarged fistula. He does not have any evidence of skin breakdown and therefore I do  not feel that he has any need for plication for risk of this. He reports that they're not having a difficulty with the flow at dialysis center. I do feel that he has lack physical exam a high likelihood of stenosis in his mid upper arm cephalic vein runoff making the aneurysmal dilatation more prominent. Have recommended a shuntogram and possible angioplasty to correct this. He understands and will proceed at his earliest convenience. He may require plication in the future if he has skin breakdown issues    Larina Earthly, MD [facility] Tel [phone] Pager [phone]

## 2016-10-21 DIAGNOSIS — N186 End stage renal disease: Secondary | ICD-10-CM | POA: Diagnosis not present

## 2016-10-21 DIAGNOSIS — Z992 Dependence on renal dialysis: Secondary | ICD-10-CM | POA: Diagnosis not present

## 2016-10-21 DIAGNOSIS — N2581 Secondary hyperparathyroidism of renal origin: Secondary | ICD-10-CM | POA: Diagnosis not present

## 2016-10-21 DIAGNOSIS — E611 Iron deficiency: Secondary | ICD-10-CM | POA: Diagnosis not present

## 2016-10-23 DIAGNOSIS — N186 End stage renal disease: Secondary | ICD-10-CM | POA: Diagnosis not present

## 2016-10-23 DIAGNOSIS — Z992 Dependence on renal dialysis: Secondary | ICD-10-CM | POA: Diagnosis not present

## 2016-10-23 DIAGNOSIS — N2581 Secondary hyperparathyroidism of renal origin: Secondary | ICD-10-CM | POA: Diagnosis not present

## 2016-10-23 DIAGNOSIS — E611 Iron deficiency: Secondary | ICD-10-CM | POA: Diagnosis not present

## 2016-10-25 DIAGNOSIS — Z992 Dependence on renal dialysis: Secondary | ICD-10-CM | POA: Diagnosis not present

## 2016-10-25 DIAGNOSIS — N186 End stage renal disease: Secondary | ICD-10-CM | POA: Diagnosis not present

## 2016-10-26 DIAGNOSIS — Z23 Encounter for immunization: Secondary | ICD-10-CM | POA: Diagnosis not present

## 2016-10-26 DIAGNOSIS — N2581 Secondary hyperparathyroidism of renal origin: Secondary | ICD-10-CM | POA: Diagnosis not present

## 2016-10-26 DIAGNOSIS — N186 End stage renal disease: Secondary | ICD-10-CM | POA: Diagnosis not present

## 2016-10-26 DIAGNOSIS — Z992 Dependence on renal dialysis: Secondary | ICD-10-CM | POA: Diagnosis not present

## 2016-10-26 DIAGNOSIS — D509 Iron deficiency anemia, unspecified: Secondary | ICD-10-CM | POA: Diagnosis not present

## 2016-10-27 ENCOUNTER — Ambulatory Visit (HOSPITAL_COMMUNITY)
Admission: RE | Admit: 2016-10-27 | Discharge: 2016-10-27 | Disposition: A | Discharge status: Home / Self Care | Payer: Medicare Other | Source: Ambulatory Visit | Attending: Surgery | Admitting: Surgery

## 2016-10-27 ENCOUNTER — Encounter (HOSPITAL_COMMUNITY): Payer: Self-pay | Admitting: Surgery

## 2016-10-27 ENCOUNTER — Ambulatory Visit (HOSPITAL_COMMUNITY)
Admission: RE | Disposition: A | Discharge status: Home / Self Care | Payer: Self-pay | Source: Ambulatory Visit | Attending: Surgery

## 2016-10-27 DIAGNOSIS — Z951 Presence of aortocoronary bypass graft: Secondary | ICD-10-CM | POA: Diagnosis not present

## 2016-10-27 DIAGNOSIS — N186 End stage renal disease: Secondary | ICD-10-CM | POA: Diagnosis not present

## 2016-10-27 DIAGNOSIS — J449 Chronic obstructive pulmonary disease, unspecified: Secondary | ICD-10-CM | POA: Insufficient documentation

## 2016-10-27 DIAGNOSIS — I509 Heart failure, unspecified: Secondary | ICD-10-CM | POA: Insufficient documentation

## 2016-10-27 DIAGNOSIS — I132 Hypertensive heart and chronic kidney disease with heart failure and with stage 5 chronic kidney disease, or end stage renal disease: Secondary | ICD-10-CM | POA: Insufficient documentation

## 2016-10-27 DIAGNOSIS — E782 Mixed hyperlipidemia: Secondary | ICD-10-CM | POA: Diagnosis not present

## 2016-10-27 DIAGNOSIS — Z7982 Long term (current) use of aspirin: Secondary | ICD-10-CM | POA: Insufficient documentation

## 2016-10-27 DIAGNOSIS — Z4901 Encounter for fitting and adjustment of extracorporeal dialysis catheter: Secondary | ICD-10-CM | POA: Insufficient documentation

## 2016-10-27 DIAGNOSIS — I739 Peripheral vascular disease, unspecified: Secondary | ICD-10-CM | POA: Insufficient documentation

## 2016-10-27 DIAGNOSIS — Z8673 Personal history of transient ischemic attack (TIA), and cerebral infarction without residual deficits: Secondary | ICD-10-CM | POA: Diagnosis not present

## 2016-10-27 DIAGNOSIS — I2510 Atherosclerotic heart disease of native coronary artery without angina pectoris: Secondary | ICD-10-CM | POA: Diagnosis not present

## 2016-10-27 DIAGNOSIS — T82898A Other specified complication of vascular prosthetic devices, implants and grafts, initial encounter: Secondary | ICD-10-CM | POA: Diagnosis not present

## 2016-10-27 HISTORY — PX: A/V FISTULAGRAM: CATH118298

## 2016-10-27 LAB — POCT I-STAT, CHEM 8
BUN: 35 mg/dL — AB (ref 6–20)
CALCIUM ION: 1.2 mmol/L (ref 1.15–1.40)
CHLORIDE: 98 mmol/L — AB (ref 101–111)
CREATININE: 9.9 mg/dL — AB (ref 0.61–1.24)
GLUCOSE: 102 mg/dL — AB (ref 65–99)
HCT: 38 % — ABNORMAL LOW (ref 39.0–52.0)
Hemoglobin: 12.9 g/dL — ABNORMAL LOW (ref 13.0–17.0)
Potassium: 3.5 mmol/L (ref 3.5–5.1)
Sodium: 139 mmol/L (ref 135–145)
TCO2: 32 mmol/L (ref 22–32)

## 2016-10-27 SURGERY — A/V FISTULAGRAM
Anesthesia: LOCAL | Laterality: Left

## 2016-10-27 MED ORDER — LIDOCAINE HCL 2 % IJ SOLN
INTRAMUSCULAR | Status: AC
  Filled 2016-10-27: qty 10

## 2016-10-27 MED ORDER — SODIUM CHLORIDE 0.9 % IV SOLN: 250.0000 mL | INTRAVENOUS | Status: DC | PRN

## 2016-10-27 MED ORDER — LABETALOL HCL 5 MG/ML IV SOLN
INTRAVENOUS | Status: DC
Start: 2016-10-27 — End: 2016-10-27
  Filled 2016-10-27: qty 4

## 2016-10-27 MED ORDER — LIDOCAINE HCL (PF) 1 % IJ SOLN
INTRAMUSCULAR | Status: DC | PRN
  Administered 2016-10-27: 2 mL

## 2016-10-27 MED ORDER — SODIUM CHLORIDE 0.9% FLUSH: 3.0000 mL | Freq: Two times a day (BID) | INTRAVENOUS | Status: DC

## 2016-10-27 MED ORDER — HEPARIN (PORCINE) IN NACL 2-0.9 UNIT/ML-% IJ SOLN
INTRAMUSCULAR | Status: AC | PRN
  Administered 2016-10-27: 500 mL

## 2016-10-27 MED ORDER — FENTANYL CITRATE (PF) 100 MCG/2ML IJ SOLN
INTRAMUSCULAR | Status: AC
  Filled 2016-10-27: qty 2

## 2016-10-27 MED ORDER — FENTANYL CITRATE (PF) 100 MCG/2ML IJ SOLN
INTRAMUSCULAR | Status: DC | PRN
  Administered 2016-10-27: 25 ug via INTRAVENOUS

## 2016-10-27 MED ORDER — SODIUM CHLORIDE 0.9% FLUSH: 3.0000 mL | INTRAVENOUS | Status: DC | PRN

## 2016-10-27 MED ORDER — HYDRALAZINE HCL 20 MG/ML IJ SOLN: 5.0000 mg | INTRAMUSCULAR | Status: DC | PRN

## 2016-10-27 MED ORDER — MIDAZOLAM HCL 2 MG/2ML IJ SOLN
INTRAMUSCULAR | Status: DC | PRN
  Administered 2016-10-27: 1 mg via INTRAVENOUS

## 2016-10-27 MED ORDER — HEPARIN (PORCINE) IN NACL 2-0.9 UNIT/ML-% IJ SOLN
INTRAMUSCULAR | Status: AC
  Filled 2016-10-27: qty 500

## 2016-10-27 MED ORDER — LABETALOL HCL 5 MG/ML IV SOLN
10.0000 mg | INTRAVENOUS | Status: DC | PRN
  Administered 2016-10-27: 10 mg via INTRAVENOUS

## 2016-10-27 MED ORDER — MIDAZOLAM HCL 2 MG/2ML IJ SOLN
INTRAMUSCULAR | Status: AC
  Filled 2016-10-27: qty 2

## 2016-10-27 MED ORDER — IODIXANOL 320 MG/ML IV SOLN
INTRAVENOUS | Status: DC | PRN
  Administered 2016-10-27: 43 mL via INTRAVENOUS

## 2016-10-27 SURGICAL SUPPLY — 6 items
COVER DOME SNAP 22 D (MISCELLANEOUS) ×2 IMPLANT
COVER PRB 48X5XTLSCP FOLD TPE (BAG) ×1 IMPLANT
COVER PROBE 5X48 (BAG) ×1
KIT MICROINTRODUCER STIFF 5F (SHEATH) ×2 IMPLANT
TRAY PV CATH (CUSTOM PROCEDURE TRAY) ×2 IMPLANT
TUBING CIL FLEX 10 FLL-RA (TUBING) ×2 IMPLANT

## 2016-10-27 NOTE — H&P (View-Only) (Signed)
V[facility] and Vein Specialist of [place]  Patient name: Mario Alexander MRN: [id number] DOB: May 11, 1970 Sex: male  REASON FOR VISIT: Follow-up left radiocephalic AV fistula  HPI: Mario Alexander is a 46 y.o. male here today for follow-up of left radiocephalic AV fistula. He has had long-standing use of this and on one occasion has undergone plication due to aneurysmal change. Centrally under went shuntogram looking for a more proximal source of stenosis and this was not found. 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 The patient reports to me that he is not having any issues with hemodialysis runs specifically with no high pressures. I do not have documentation of this.  Past Medical History:  Diagnosis Date  . CHF (congestive heart failure) (HCC)   . COPD (chronic obstructive pulmonary disease) (HCC)   . Coronary atherosclerosis of native coronary artery    Multivessel status post CABG at [facility] 07/2011, LVEF 70%  . ESRD on hemodialysis (HCC)   . Essential hypertension, benign   . History of medication noncompliance   . History of stroke 2012   No ASD, PFO, SOE by TEE 01/2010  . Hypertensive heart disease   . Mixed hyperlipidemia   . Peripheral vascular disease (HCC)     Family History  Problem Relation Age of Onset  . Diabetes Mother   . Hypertension Mother   . Hyperlipidemia Mother   . Heart disease Mother        before age 88  . COPD Father   . Lung cancer Father   . Cancer Father     SOCIAL HISTORY: Social History  Substance Use Topics  . Smoking status: Never Smoker  . Smokeless tobacco: Never Used  . Alcohol use No    No Known Allergies  Current Outpatient Prescriptions  Medication Sig Dispense Refill  . amLODipine (NORVASC) 10 MG tablet     . aspirin 81 MG tablet Take 81 mg by mouth daily.    . cloNIDine (CATAPRES) 0.2 MG tablet Take 0.2 mg by  mouth 2 (two) times daily.    . hydrALAZINE (APRESOLINE) 50 MG tablet Take 50 mg by mouth 2 (two) times daily.    . multivitamin (RENA-VIT) TABS tablet Take 1 tablet by mouth 3 (three) times daily.     . SENSIPAR 30 MG tablet Take 1 tablet by mouth daily.    . sevelamer carbonate (RENVELA) 800 MG tablet Take 800 mg by mouth 3 (three) times daily with meals.     No current facility-administered medications for this visit.     REVIEW OF SYSTEMS:   denotes positive finding,  denotes negative finding Cardiac  Comments:  Chest pain or chest pressure:    Shortness of breath upon exertion:    Short of breath when lying flat:    Irregular heart rhythm:        Vascular    Pain in calf, thigh, or hip brought on by ambulation:    Pain in feet at night that wakes you up from your sleep:     Blood clot in your veins:    Leg swelling:           PHYSICAL EXAM: Vitals:   10/20/16 1101  BP: 138/87  Pulse: 91  SpO2: 99%  Weight: 179 lb 6.4 oz (81.4 kg)  Height:  (1.676 m)    GENERAL: The patient is a well-nourished male, in no  acute distress. The vital signs are documented above. CARDIOVASCULAR: He does have a large fistula with some tortuosity and his forearm on the left. By physical exam the drainage out of this is through the basilic vein at the antecubital space. He has a very pulsatile nature both the enlarged portion of the fistula and the fistula at the antecubital space. There is a very distinct change to a thrill in the mid upper arm suggesting a tight stenosis at this level. PULMONARY: There is good air exchange  MUSCULOSKELETAL: There are no major deformities or cyanosis. NEUROLOGIC: No focal weakness or paresthesias are detected. SKIN: There are no ulcers or rashes noted. PSYCHIATRIC: The patient has a normal affect.  DATA:  None  MEDICAL ISSUES: I discussed the significance of his enlarged fistula. He does not have any evidence of skin breakdown and therefore I do  not feel that he has any need for plication for risk of this. He reports that they're not having a difficulty with the flow at dialysis center. I do feel that he has lack physical exam a high likelihood of stenosis in his mid upper arm cephalic vein runoff making the aneurysmal dilatation more prominent. Have recommended a shuntogram and possible angioplasty to correct this. He understands and will proceed at his earliest convenience. He may require plication in the future if he has skin breakdown issues    Larina Earthly, MD [facility] V[facility] and Vein Specialists of [facility] Tel [phone] Pager [phone]

## 2016-10-27 NOTE — Op Note (Signed)
    Patient name: Mario Alexander MRN: [id number] DOB: 14-Jul-1970 Sex: male  10/27/2016 Pre-operative Diagnosis: ESRD Post-operative diagnosis:  Same Surgeon:  Durene Cal Procedure Performed:  1.  U/S guided access left cephalic vein fistula  2.  Conscious sedation (13 minutes)  3.  Fistulogram    Indications:  Pt with aneurysmal AVF.  Here today for fistulogram.  Procedure:  The patient was identified in the holding area and taken to room 8.  The patient was then placed supine on the table and prepped and draped in the usual sterile fashion.  A time out was called.  Conscious sedation was administered with the use of IV fentanyl and versed under continuous monitoring by myself and the circulating nurse.  HR, BP, and O2 stas were continuously monitored0.  Ultrasound was used to evaluate the fistula.  The vein was patent and compressible.  A digital ultrasound image was acquired.  The fistula was then accessed under ultrasound guidance using a micropuncture needle.  An 018 wire was then asvanced without resistance and a micropuncture sheath was placed.  Contrast injections were then performed through the sheath.  Findings:  Arterial anastamosis was patent without stenosis.  No central venous stenosis.  Aneurysmal changes noted in the fistula near the wrist without stenosis noted.   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 Durene Cal, M.D. [facility] of [place] Office: [phone] Pager:  [phone]

## 2016-10-27 NOTE — Discharge Instructions (Signed)

## 2016-10-27 NOTE — Interval H&P Note (Signed)
History and Physical Interval Note:  10/27/2016 7:26 AM  Mario Alexander  has presented today for surgery, with the diagnosis of instage renal  The various methods of treatment have been discussed with the patient and family. After consideration of risks, benefits and other options for treatment, the patient has consented to  Procedure(s): A/V Fistulagram - Left arm (N/A) as a surgical intervention .  The patient's history has been reviewed, patient examined, no change in status, stable for surgery.  I have reviewed the patient's chart and labs.  Questions were answered to the patient's satisfaction.     Durene Cal

## 2016-10-28 DIAGNOSIS — Z23 Encounter for immunization: Secondary | ICD-10-CM | POA: Diagnosis not present

## 2016-10-28 DIAGNOSIS — Z992 Dependence on renal dialysis: Secondary | ICD-10-CM | POA: Diagnosis not present

## 2016-10-28 DIAGNOSIS — N2581 Secondary hyperparathyroidism of renal origin: Secondary | ICD-10-CM | POA: Diagnosis not present

## 2016-10-28 DIAGNOSIS — N186 End stage renal disease: Secondary | ICD-10-CM | POA: Diagnosis not present

## 2016-10-28 DIAGNOSIS — D509 Iron deficiency anemia, unspecified: Secondary | ICD-10-CM | POA: Diagnosis not present

## 2016-10-28 MED FILL — Lidocaine HCl Local Inj 2%: INTRAMUSCULAR | Qty: 10 | Status: AC

## 2016-10-30 DIAGNOSIS — N186 End stage renal disease: Secondary | ICD-10-CM | POA: Diagnosis not present

## 2016-10-30 DIAGNOSIS — Z23 Encounter for immunization: Secondary | ICD-10-CM | POA: Diagnosis not present

## 2016-10-30 DIAGNOSIS — N2581 Secondary hyperparathyroidism of renal origin: Secondary | ICD-10-CM | POA: Diagnosis not present

## 2016-10-30 DIAGNOSIS — Z992 Dependence on renal dialysis: Secondary | ICD-10-CM | POA: Diagnosis not present

## 2016-10-30 DIAGNOSIS — D509 Iron deficiency anemia, unspecified: Secondary | ICD-10-CM | POA: Diagnosis not present

## 2016-11-02 DIAGNOSIS — N186 End stage renal disease: Secondary | ICD-10-CM | POA: Diagnosis not present

## 2016-11-02 DIAGNOSIS — Z992 Dependence on renal dialysis: Secondary | ICD-10-CM | POA: Diagnosis not present

## 2016-11-02 DIAGNOSIS — N2581 Secondary hyperparathyroidism of renal origin: Secondary | ICD-10-CM | POA: Diagnosis not present

## 2016-11-02 DIAGNOSIS — Z23 Encounter for immunization: Secondary | ICD-10-CM | POA: Diagnosis not present

## 2016-11-02 DIAGNOSIS — D509 Iron deficiency anemia, unspecified: Secondary | ICD-10-CM | POA: Diagnosis not present

## 2016-11-04 DIAGNOSIS — D509 Iron deficiency anemia, unspecified: Secondary | ICD-10-CM | POA: Diagnosis not present

## 2016-11-04 DIAGNOSIS — N2581 Secondary hyperparathyroidism of renal origin: Secondary | ICD-10-CM | POA: Diagnosis not present

## 2016-11-04 DIAGNOSIS — Z23 Encounter for immunization: Secondary | ICD-10-CM | POA: Diagnosis not present

## 2016-11-04 DIAGNOSIS — N186 End stage renal disease: Secondary | ICD-10-CM | POA: Diagnosis not present

## 2016-11-04 DIAGNOSIS — Z992 Dependence on renal dialysis: Secondary | ICD-10-CM | POA: Diagnosis not present

## 2016-11-07 DIAGNOSIS — Z992 Dependence on renal dialysis: Secondary | ICD-10-CM | POA: Diagnosis not present

## 2016-11-07 DIAGNOSIS — Z23 Encounter for immunization: Secondary | ICD-10-CM | POA: Diagnosis not present

## 2016-11-07 DIAGNOSIS — N186 End stage renal disease: Secondary | ICD-10-CM | POA: Diagnosis not present

## 2016-11-07 DIAGNOSIS — N2581 Secondary hyperparathyroidism of renal origin: Secondary | ICD-10-CM | POA: Diagnosis not present

## 2016-11-07 DIAGNOSIS — D509 Iron deficiency anemia, unspecified: Secondary | ICD-10-CM | POA: Diagnosis not present

## 2016-11-08 DIAGNOSIS — Z23 Encounter for immunization: Secondary | ICD-10-CM | POA: Diagnosis not present

## 2016-11-08 DIAGNOSIS — Z992 Dependence on renal dialysis: Secondary | ICD-10-CM | POA: Diagnosis not present

## 2016-11-08 DIAGNOSIS — N186 End stage renal disease: Secondary | ICD-10-CM | POA: Diagnosis not present

## 2016-11-08 DIAGNOSIS — N2581 Secondary hyperparathyroidism of renal origin: Secondary | ICD-10-CM | POA: Diagnosis not present

## 2016-11-08 DIAGNOSIS — D509 Iron deficiency anemia, unspecified: Secondary | ICD-10-CM | POA: Diagnosis not present

## 2016-11-09 DIAGNOSIS — N2581 Secondary hyperparathyroidism of renal origin: Secondary | ICD-10-CM | POA: Diagnosis not present

## 2016-11-09 DIAGNOSIS — Z992 Dependence on renal dialysis: Secondary | ICD-10-CM | POA: Diagnosis not present

## 2016-11-09 DIAGNOSIS — N186 End stage renal disease: Secondary | ICD-10-CM | POA: Diagnosis not present

## 2016-11-09 DIAGNOSIS — Z23 Encounter for immunization: Secondary | ICD-10-CM | POA: Diagnosis not present

## 2016-11-09 DIAGNOSIS — D509 Iron deficiency anemia, unspecified: Secondary | ICD-10-CM | POA: Diagnosis not present

## 2016-11-11 DIAGNOSIS — Z23 Encounter for immunization: Secondary | ICD-10-CM | POA: Diagnosis not present

## 2016-11-11 DIAGNOSIS — N186 End stage renal disease: Secondary | ICD-10-CM | POA: Diagnosis not present

## 2016-11-11 DIAGNOSIS — D509 Iron deficiency anemia, unspecified: Secondary | ICD-10-CM | POA: Diagnosis not present

## 2016-11-11 DIAGNOSIS — N2581 Secondary hyperparathyroidism of renal origin: Secondary | ICD-10-CM | POA: Diagnosis not present

## 2016-11-11 DIAGNOSIS — Z992 Dependence on renal dialysis: Secondary | ICD-10-CM | POA: Diagnosis not present

## 2016-11-13 DIAGNOSIS — D509 Iron deficiency anemia, unspecified: Secondary | ICD-10-CM | POA: Diagnosis not present

## 2016-11-13 DIAGNOSIS — N2581 Secondary hyperparathyroidism of renal origin: Secondary | ICD-10-CM | POA: Diagnosis not present

## 2016-11-13 DIAGNOSIS — Z23 Encounter for immunization: Secondary | ICD-10-CM | POA: Diagnosis not present

## 2016-11-13 DIAGNOSIS — N186 End stage renal disease: Secondary | ICD-10-CM | POA: Diagnosis not present

## 2016-11-13 DIAGNOSIS — Z992 Dependence on renal dialysis: Secondary | ICD-10-CM | POA: Diagnosis not present

## 2016-11-16 DIAGNOSIS — Z23 Encounter for immunization: Secondary | ICD-10-CM | POA: Diagnosis not present

## 2016-11-16 DIAGNOSIS — Z992 Dependence on renal dialysis: Secondary | ICD-10-CM | POA: Diagnosis not present

## 2016-11-16 DIAGNOSIS — N186 End stage renal disease: Secondary | ICD-10-CM | POA: Diagnosis not present

## 2016-11-16 DIAGNOSIS — N2581 Secondary hyperparathyroidism of renal origin: Secondary | ICD-10-CM | POA: Diagnosis not present

## 2016-11-16 DIAGNOSIS — D509 Iron deficiency anemia, unspecified: Secondary | ICD-10-CM | POA: Diagnosis not present

## 2016-11-18 DIAGNOSIS — Z23 Encounter for immunization: Secondary | ICD-10-CM | POA: Diagnosis not present

## 2016-11-18 DIAGNOSIS — N186 End stage renal disease: Secondary | ICD-10-CM | POA: Diagnosis not present

## 2016-11-18 DIAGNOSIS — N2581 Secondary hyperparathyroidism of renal origin: Secondary | ICD-10-CM | POA: Diagnosis not present

## 2016-11-18 DIAGNOSIS — Z992 Dependence on renal dialysis: Secondary | ICD-10-CM | POA: Diagnosis not present

## 2016-11-18 DIAGNOSIS — D509 Iron deficiency anemia, unspecified: Secondary | ICD-10-CM | POA: Diagnosis not present

## 2016-11-20 DIAGNOSIS — Z23 Encounter for immunization: Secondary | ICD-10-CM | POA: Diagnosis not present

## 2016-11-20 DIAGNOSIS — D509 Iron deficiency anemia, unspecified: Secondary | ICD-10-CM | POA: Diagnosis not present

## 2016-11-20 DIAGNOSIS — N2581 Secondary hyperparathyroidism of renal origin: Secondary | ICD-10-CM | POA: Diagnosis not present

## 2016-11-20 DIAGNOSIS — Z992 Dependence on renal dialysis: Secondary | ICD-10-CM | POA: Diagnosis not present

## 2016-11-20 DIAGNOSIS — N186 End stage renal disease: Secondary | ICD-10-CM | POA: Diagnosis not present

## 2016-11-23 DIAGNOSIS — N2581 Secondary hyperparathyroidism of renal origin: Secondary | ICD-10-CM | POA: Diagnosis not present

## 2016-11-23 DIAGNOSIS — N186 End stage renal disease: Secondary | ICD-10-CM | POA: Diagnosis not present

## 2016-11-23 DIAGNOSIS — Z992 Dependence on renal dialysis: Secondary | ICD-10-CM | POA: Diagnosis not present

## 2016-11-23 DIAGNOSIS — Z23 Encounter for immunization: Secondary | ICD-10-CM | POA: Diagnosis not present

## 2016-11-23 DIAGNOSIS — D509 Iron deficiency anemia, unspecified: Secondary | ICD-10-CM | POA: Diagnosis not present

## 2016-11-24 DIAGNOSIS — N186 End stage renal disease: Secondary | ICD-10-CM | POA: Diagnosis not present

## 2016-11-24 DIAGNOSIS — Z992 Dependence on renal dialysis: Secondary | ICD-10-CM | POA: Diagnosis not present

## 2016-11-25 DIAGNOSIS — N186 End stage renal disease: Secondary | ICD-10-CM | POA: Diagnosis not present

## 2016-11-25 DIAGNOSIS — Z992 Dependence on renal dialysis: Secondary | ICD-10-CM | POA: Diagnosis not present

## 2016-11-25 DIAGNOSIS — D509 Iron deficiency anemia, unspecified: Secondary | ICD-10-CM | POA: Diagnosis not present

## 2016-11-25 DIAGNOSIS — N2581 Secondary hyperparathyroidism of renal origin: Secondary | ICD-10-CM | POA: Diagnosis not present

## 2016-11-25 DIAGNOSIS — Z23 Encounter for immunization: Secondary | ICD-10-CM | POA: Diagnosis not present

## 2016-11-26 DIAGNOSIS — Z992 Dependence on renal dialysis: Secondary | ICD-10-CM | POA: Diagnosis not present

## 2016-11-26 DIAGNOSIS — N2581 Secondary hyperparathyroidism of renal origin: Secondary | ICD-10-CM | POA: Diagnosis not present

## 2016-11-26 DIAGNOSIS — N186 End stage renal disease: Secondary | ICD-10-CM | POA: Diagnosis not present

## 2016-11-27 DIAGNOSIS — Z992 Dependence on renal dialysis: Secondary | ICD-10-CM | POA: Diagnosis not present

## 2016-11-27 DIAGNOSIS — N186 End stage renal disease: Secondary | ICD-10-CM | POA: Diagnosis not present

## 2016-11-27 DIAGNOSIS — N2581 Secondary hyperparathyroidism of renal origin: Secondary | ICD-10-CM | POA: Diagnosis not present

## 2016-11-28 ENCOUNTER — Emergency Department (HOSPITAL_COMMUNITY)
Admission: EM | Admit: 2016-11-28 | Discharge: 2016-12-26 | Disposition: E | Payer: Medicare Other | Attending: Emergency Medicine | Admitting: Emergency Medicine

## 2016-11-28 ENCOUNTER — Encounter (HOSPITAL_COMMUNITY): Payer: Self-pay

## 2016-11-28 DIAGNOSIS — Z9861 Coronary angioplasty status: Secondary | ICD-10-CM | POA: Diagnosis not present

## 2016-11-28 DIAGNOSIS — I120 Hypertensive chronic kidney disease with stage 5 chronic kidney disease or end stage renal disease: Secondary | ICD-10-CM | POA: Diagnosis not present

## 2016-11-28 DIAGNOSIS — Z992 Dependence on renal dialysis: Secondary | ICD-10-CM | POA: Diagnosis not present

## 2016-11-28 DIAGNOSIS — Z7982 Long term (current) use of aspirin: Secondary | ICD-10-CM | POA: Diagnosis not present

## 2016-11-28 DIAGNOSIS — Z79899 Other long term (current) drug therapy: Secondary | ICD-10-CM | POA: Insufficient documentation

## 2016-11-28 DIAGNOSIS — N186 End stage renal disease: Secondary | ICD-10-CM | POA: Insufficient documentation

## 2016-11-28 DIAGNOSIS — I469 Cardiac arrest, cause unspecified: Secondary | ICD-10-CM | POA: Insufficient documentation

## 2016-11-28 MED ORDER — SODIUM BICARBONATE 8.4 % IV SOLN
INTRAVENOUS | Status: DC | PRN
  Administered 2016-11-28: 50 meq via INTRAVENOUS

## 2016-11-28 MED ORDER — EPINEPHRINE PF 1 MG/10ML IJ SOSY
PREFILLED_SYRINGE | INTRAMUSCULAR | Status: DC | PRN
  Administered 2016-11-28: 1 mg via INTRAVENOUS

## 2016-11-29 MED FILL — Medication: Qty: 1 | Status: AC

## 2016-12-26 NOTE — ED Notes (Signed)
[facility] called and will go to house to see if family [address]

## 2016-12-26 NOTE — ED Provider Notes (Addendum)
[facility] EMERGENCY DEPARTMENT Provider Note   CSN: [id number] Arrival date & time: 2016/12/02  1255     History   Chief Complaint No chief complaint on file.   HPI Mario Alexander is a 46 y.o. male. Chief complaint is cardiac arrest, found unresponsive.  HPI:  46 year old male. Per EMS report the patient was "found by fire department" in his car near [employer]. He was unresponsive.  Per first responders he was pulseless, and apneic. He was given bag-valve-mask as per respirations and CPR. Upon arrival of paramedics his placed on the monitor per medic report was in "fine V. Fib".  Age was intubated. IV placed. Given multiple medications for "fine V. fib". This is inclusive of epinephrine 1 mg 4, magnesium 2 g 1, amiodarone 450 mg 1 (300+150), per my pruritic correction was given calcium chloride one amp, and sodium bicarbonate 2 amps, as pt is reportedly a dialysis patient.  Past Medical History:  Diagnosis Date  . CHF (congestive heart failure) (HCC)   . COPD (chronic obstructive pulmonary disease) (HCC)   . Coronary atherosclerosis of native coronary artery    Multivessel status post CABG at [facility] 07/2011, LVEF 70%  . ESRD on hemodialysis (HCC)   . Essential hypertension, benign   . History of medication noncompliance   . History of stroke 2012   No ASD, PFO, SOE by TEE 01/2010  . Hypertensive heart disease   . Mixed hyperlipidemia   . Peripheral vascular disease [facility])     Patient Active Problem List   Diagnosis Date Noted  . Mixed hyperlipidemia 08/03/2013  . Precordial pain 08/03/2013  . Coronary atherosclerosis of native coronary artery 08/03/2013  . ESRD on hemodialysis (HCC) 12/23/2010  . Essential hypertension, benign 12/23/2010    Past Surgical History:  Procedure Laterality Date  . A/V FISTULAGRAM Left 10/27/2016   Procedure: A/V Fistulagram - Left arm;  Surgeon: Nada Libman, MD;  Location: [facility] INVASIVE CV LAB;  Service:  Cardiovascular;  Laterality: Left;  . CARDIAC CATHETERIZATION    . COLONOSCOPY    . CORONARY ARTERY BYPASS GRAFT  07/2011   [facility] - LIMA to LAD, SVG to circumflex, SVG to ramus intermedius  . INSERTION OF DIALYSIS CATHETER  01/02/2011   Procedure: INSERTION OF DIALYSIS CATHETER;  Surgeon: Pryor Ochoa, MD;  Location: [facility] OR;  Service: Vascular;  Laterality: N/A;  Insertion of Dialysis catheter Right Internal Jugular with 24cm dialysis catheter  . INSERTION OF DIALYSIS CATHETER Right 11/08/2014   Procedure: INSERTION OF DIALYSIS CATHETER RIGHT INTERNAL JUGULAR;  Surgeon: Chuck Hint, MD;  Location: [facility] OR;  Service: Vascular;  Laterality: Right;  . PERIPHERAL VASCULAR CATHETERIZATION N/A 03/14/2015   Procedure: A/V Shuntogram/Fistulagram;  Surgeon: Fransisco Hertz, MD;  Location: [facility] INVASIVE CV LAB;  Service: Cardiovascular;  Laterality: N/A;  . REVISON OF ARTERIOVENOUS FISTULA Left 11/08/2014   Procedure: REVISION PLICATION OF LEFT ARM ARTERIOVENOUS FISTULA;  Surgeon: Chuck Hint, MD;  Location: [facility] OR;  Service: Vascular;  Laterality: Left;  . SHUNTOGRAM N/A 02/01/2014   Procedure: FISTULOGRAM;  Surgeon: Fransisco Hertz, MD;  Location: [facility] CATH LAB;  Service: Cardiovascular;  Laterality: N/A;       Home Medications    Prior to Admission medications   Medication Sig Start Date End Date Taking? Authorizing Provider  amLODipine (NORVASC) 10 MG tablet  09/15/16   [provider]  aspirin 81 MG tablet Take 81 mg by mouth daily.    [provider]  cloNIDine (CATAPRES) 0.2 MG tablet Take 0.2 mg by mouth 2 (two) times daily.    [provider]  hydrALAZINE (APRESOLINE) 50 MG tablet Take 50 mg by mouth 2 (two) times daily.    [provider]  multivitamin (RENA-VIT) TABS tablet Take 1 tablet by mouth daily as needed (for supplement).     [provider]  SENSIPAR 30 MG tablet Take 1 tablet by mouth daily. 03/05/15   [provider]    sevelamer carbonate (RENVELA) 800 MG tablet Take 1,600 mg by mouth 3 (three) times daily with meals.     [provider]    Family History Family History  Problem Relation Age of Onset  . Diabetes Mother   . Hypertension Mother   . Hyperlipidemia Mother   . Heart disease Mother        before age 48  . COPD Father   . Lung cancer Father   . Cancer Father     Social History Social History  Substance Use Topics  . Smoking status: Never Smoker  . Smokeless tobacco: Never Used  . Alcohol use No     Allergies   Patient has no known allergies.   Review of Systems Review of Systems  Unable to perform ROS: Patient unresponsive     Physical Exam Updated Vital Signs There were no vitals taken for this visit.  Physical Exam Primary survey. Airway: 7.0 ET tube. Breathing: Undergoing bag assisted ventilations via ET tube. Symmetric breath sounds. Circulation: Lucas device in place with ongoing mechanical chest compressions. He has a femoral pulse with compressions. When the [facility] device is held he is pulseless and monitor shows asystole area neurological: GCS 3 unresponsive pupils 6 mm and reactive symmetric.  Secondary survey.  HEENT pupils.  Eyes Conjunctiva not pale. No scleral icterus. Neck Neck, prominent jugular venous distention Chest: Healed mid sternotomy scar. symmetric breath sounds with bagged ventilations.  Cardiac pulseless. When device is held bedside ultrasound does not show any cardiac activity. There is no obvious large pericardial effusion. 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 Abdomen soft scaphoid. Skin not high for diaphoretic. Cool to the touch.  Neuro GCS 3.     ED Treatments / Results  Labs (all labs ordered are listed, but only abnormal results are displayed) Labs Reviewed - No data to display  EKG  EKG Interpretation None       Radiology No results found.  Procedures Procedures (including  critical care time)  Medications Ordered in ED Medications - No data to display   Initial Impression / Assessment and Plan / ED Course  I have reviewed the triage vital signs and the nursing notes.  Pertinent labs & imaging results that were available during my care of the patient were reviewed by me and considered in my medical decision making (see chart for details).    Pre-hospital course as above. Upon arrival pulse check was obtained and the [place] device was held. He is pulseless and Neck. He has no femoral, carotid, radial pulse. Bedside ultrasound shows no cardiac activity. CPR was continued. Patient given additional calcium chloride, additional amp of sodium bicarbonate. This on the premise of possible hyperkalemia secondary to dialysis. Was given epinephrine 1 mg. CPR resumed. Initiated additional 5 minutes another pulse check was obtained. He remains pulseless apneic and unresponsive. His initial 5 call for paramedics was 1217. Is unknown how long the patient was in the car prior to being "found"  by fire department today. At this process of efforts were terminated time of death was 1301 p.m.  Final Clinical Impressions(s) / ED Diagnoses   Final diagnoses:  Cardiac arrest (HCC)    I spoke with Raiford Noble, the ME. Not an ME case Pt's death discussed with multiple family meebers, including his Wife. I have left message with his PCP Dr. Renard Matter re: Death Certificate.   New Prescriptions New Prescriptions   No medications on file     Rolland Porter, MD 12/07/16 1315    Rolland Porter, MD 2016/12/07 1527

## 2016-12-26 NOTE — Progress Notes (Signed)
Called [phone], the number listed for patient's mother and male answered and said no one by the name given Mario Lamer(Dorothy Maxwell) lives there.  "You have the wrong number".

## 2016-12-26 NOTE — ED Notes (Addendum)
Attempted to call Audry PiliDebra Koskinen spouse and no answer and no voice mail option [phone] Attempted Mother-Maxwell at number listed and no answer

## 2016-12-26 NOTE — Progress Notes (Signed)
Attempted to call Mario Alexander who is listed as spouse, no answer.  Attempted to call mother, no answer but did leave a message for [facility].  Will continue to call.

## 2016-12-26 NOTE — ED Triage Notes (Addendum)
[facility] with CPR in progress following being found by fire department pulseless and apneic. CPR started immediately. EMS arrival placed king airway and IO left tib/fib. Patient unknown downtime and EMS reports first rhythm v. Fib. Patient shocked x 8, received Epi x 8, Amiodarone x 2 , magnesium, bicarbonate x 2, calcium all prior to arrival by EMS. cbg 115.  Initial code started at 1217. No cardiac activity noted by U/S and CPR discontinued with asystole on monitor at 1301.

## 2016-12-26 DEATH — deceased

## 2017-03-22 IMAGING — CR DG CHEST 1V PORT
1 series · 1 of 1 positions shown · non-contrast
Comparison: 12/27/2011

CLINICAL DATA: Right internal jugular dialysis catheter insertion.

EXAM:
PORTABLE CHEST 1 VIEW

[AP]
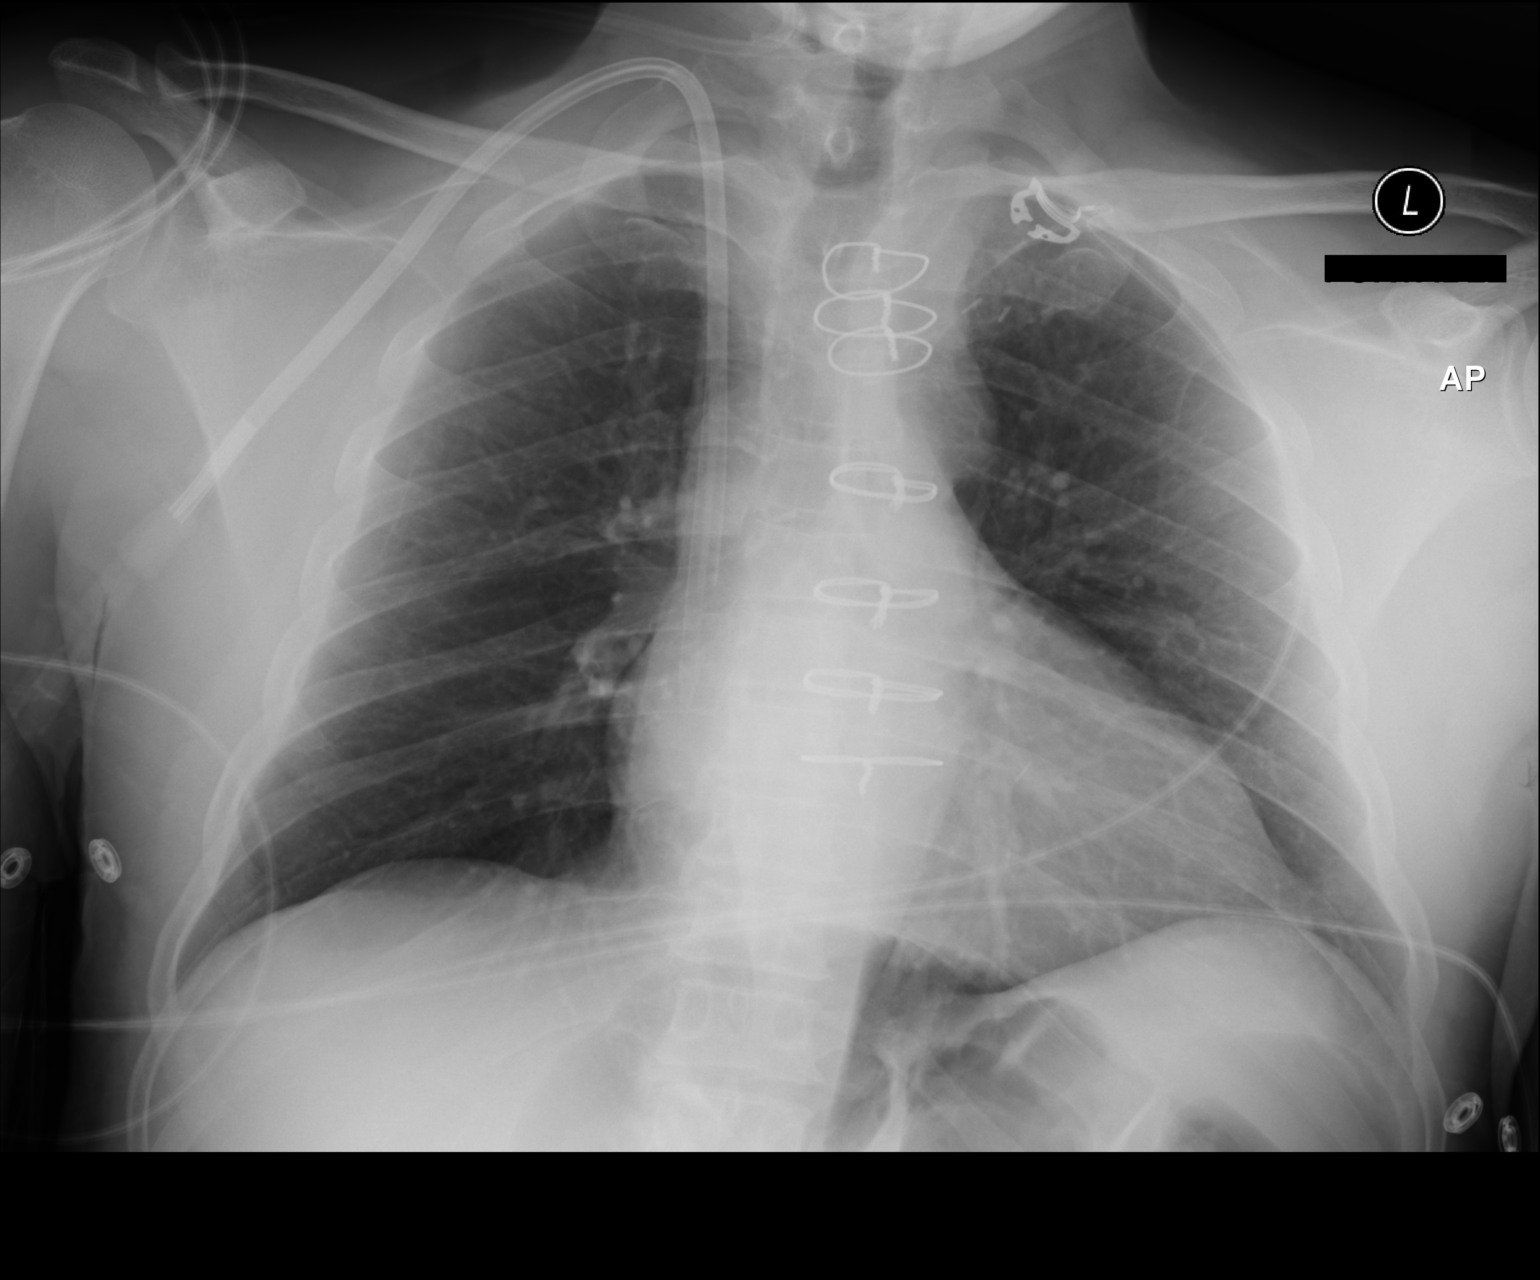

[1 of 1 positions shown; findings below may reference images not displayed]

FINDINGS: Right internal jugular dialysis catheter noted, distal tip at the
cavoatrial junction. No pneumothorax.

Prior median sternotomy. Borderline enlargement of the
cardiopericardial silhouette, without edema.
IMPRESSION: 1. Right internal jugular dialysis catheter tip: Cavoatrial
junction. No pneumothorax or complicating feature.
2. Borderline enlargement of the cardiopericardial silhouette.
